# Patient Record
Sex: Female | Born: 1975 | Race: White | Hispanic: No | Marital: Single | Smoking: Former smoker
Health system: Southern US, Community
[De-identification: ages and names within clinical notes are randomized; demographics above are authoritative.]

## PROBLEM LIST (undated history)

## (undated) DIAGNOSIS — I5020 Unspecified systolic (congestive) heart failure: Secondary | ICD-10-CM

## (undated) DIAGNOSIS — M349 Systemic sclerosis, unspecified: Secondary | ICD-10-CM

## (undated) DIAGNOSIS — E063 Autoimmune thyroiditis: Secondary | ICD-10-CM

## (undated) DIAGNOSIS — D649 Anemia, unspecified: Secondary | ICD-10-CM

## (undated) DIAGNOSIS — I428 Other cardiomyopathies: Secondary | ICD-10-CM

## (undated) DIAGNOSIS — M797 Fibromyalgia: Secondary | ICD-10-CM

## (undated) DIAGNOSIS — M779 Enthesopathy, unspecified: Secondary | ICD-10-CM

## (undated) DIAGNOSIS — I447 Left bundle-branch block, unspecified: Secondary | ICD-10-CM

## (undated) DIAGNOSIS — Z87442 Personal history of urinary calculi: Secondary | ICD-10-CM

## (undated) DIAGNOSIS — J40 Bronchitis, not specified as acute or chronic: Secondary | ICD-10-CM

## (undated) DIAGNOSIS — D8989 Other specified disorders involving the immune mechanism, not elsewhere classified: Secondary | ICD-10-CM

## (undated) DIAGNOSIS — I10 Essential (primary) hypertension: Secondary | ICD-10-CM

## (undated) DIAGNOSIS — K219 Gastro-esophageal reflux disease without esophagitis: Secondary | ICD-10-CM

## (undated) DIAGNOSIS — N809 Endometriosis, unspecified: Secondary | ICD-10-CM

## (undated) DIAGNOSIS — E069 Thyroiditis, unspecified: Secondary | ICD-10-CM

## (undated) DIAGNOSIS — I454 Nonspecific intraventricular block: Secondary | ICD-10-CM

## (undated) DIAGNOSIS — N189 Chronic kidney disease, unspecified: Secondary | ICD-10-CM

## (undated) DIAGNOSIS — I739 Peripheral vascular disease, unspecified: Secondary | ICD-10-CM

## (undated) DIAGNOSIS — G4730 Sleep apnea, unspecified: Secondary | ICD-10-CM

## (undated) DIAGNOSIS — M3500 Sicca syndrome, unspecified: Secondary | ICD-10-CM

## (undated) DIAGNOSIS — J189 Pneumonia, unspecified organism: Secondary | ICD-10-CM

## (undated) DIAGNOSIS — I272 Pulmonary hypertension, unspecified: Secondary | ICD-10-CM

## (undated) DIAGNOSIS — R51 Headache: Secondary | ICD-10-CM

## (undated) DIAGNOSIS — R519 Headache, unspecified: Secondary | ICD-10-CM

## (undated) HISTORY — PX: COLONOSCOPY: SHX174

## (undated) HISTORY — PX: UPPER GI ENDOSCOPY: SHX6162

## (undated) HISTORY — DX: Other specified disorders involving the immune mechanism, not elsewhere classified: D89.89

## (undated) HISTORY — PX: CHOLECYSTECTOMY: SHX55

## (undated) HISTORY — DX: Thyroiditis, unspecified: E06.9

## (undated) HISTORY — PX: CYSTOSCOPY: SUR368

## (undated) HISTORY — PX: WISDOM TOOTH EXTRACTION: SHX21

---
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
## 2013-04-10 ENCOUNTER — Other Ambulatory Visit: Payer: Self-pay | Admitting: Family Medicine

## 2013-04-10 DIAGNOSIS — E041 Nontoxic single thyroid nodule: Secondary | ICD-10-CM

## 2013-04-15 ENCOUNTER — Ambulatory Visit
Admission: RE | Admit: 2013-04-15 | Discharge: 2013-04-15 | Disposition: A | Discharge status: Home / Self Care | Payer: Medicaid Other | Source: Ambulatory Visit | Attending: Family Medicine | Admitting: Family Medicine

## 2013-04-15 DIAGNOSIS — E041 Nontoxic single thyroid nodule: Secondary | ICD-10-CM

## 2013-11-11 ENCOUNTER — Encounter: Payer: Self-pay | Admitting: Internal Medicine

## 2013-11-11 ENCOUNTER — Ambulatory Visit (INDEPENDENT_AMBULATORY_CARE_PROVIDER_SITE_OTHER): Payer: Medicaid Other | Admitting: Internal Medicine

## 2013-11-11 ENCOUNTER — Encounter (INDEPENDENT_AMBULATORY_CARE_PROVIDER_SITE_OTHER): Payer: Self-pay

## 2013-11-11 VITALS — BP 128/72 | HR 93 | Temp 98.4°F | Resp 12 | Ht 65.0 in | Wt 276.0 lb

## 2013-11-11 DIAGNOSIS — E063 Autoimmune thyroiditis: Secondary | ICD-10-CM

## 2013-11-11 DIAGNOSIS — R5381 Other malaise: Secondary | ICD-10-CM

## 2013-11-11 DIAGNOSIS — R5383 Other fatigue: Secondary | ICD-10-CM

## 2013-11-11 NOTE — Progress Notes (Signed)
Patient ID: Sarah English, female   DOB: 05-17-1976, 38 y.o.   MRN: [id number]   HPI  Sarah English is a 38 y.o.-year-old female, referred by her PCP, Dr. Clarene Duke, for evaluation for Hashimoto's thyroiditis and a thyroid cyst. She relocated in [place] in 01/2013, from [place], [place].  Pt. has been dx with thyroiditis based on the aspect of the thyroid at the time of a recent thyroid U/S: heterogenous aspect, c/w thyroiditis.   Pt does feel an occasional "lump" in neck, also hoarseness, occasional dysphagia/no odynophagia, no SOB with lying down.  I reviewed pt's latest thyroid tests - per records from PCP: TSH 2.5   Pt describes in the last 1.5 years, increased in last 5-8 mo: - fatigue - HAs - losing hair, bald spots, female-pattern balding - inability to concentrate - joint pain - like broken glass in joints! - mm aches - weight gain: 180 >> 276 now (over 1 year) - cold intolerance - no constipation - + dry skin - no depression/some anxiety - lately - swelling in hands and feet She has a h/o anemia in the past.  She is a single mom. She is a relatively clean eating: organic, minimally processed, no artificial sweeteners, no sodas. She was running 3 miles every day 5/7 days - no energy now.  She has no FH of thyroid disorders. No FH of thyroid cancer.  No h/o radiation tx to head or neck. She started Tri-iodine and Tyrosine 1.5 mo ago >> helped a little. She does not use iodinated salt at home.  ROS: Constitutional: see HPI Eyes: no blurry vision, no xerophthalmia ENT: no sore throat, + nodules palpated in throat, + dysphagia/no odynophagia, + hoarseness, + tinnitus Cardiovascular: no CP/SOB/palpitations/+leg swelling Respiratory: no cough/SOB Gastrointestinal: no N/V/D/C, + heartburn Musculoskeletal: + all: muscle/joint aches Skin: + rash, + itching, + hair loss Neurological: no tremors/numbness/tingling/dizziness, + HA Psychiatric: no depression/+ anxiety  Past Medical  History  Diagnosis Date  . Autoimmune disorder   . Thyroiditis    Past Surgical History  Procedure Laterality Date  . Mandible fracture surgery  1994    upper mandibular  . Cholecystectomy    . Cesarean section     History   Social History  . Marital Status: single    Spouse Name: N/A    Number of Children: 1   Occupational History  . Unemployed, looking for work in the area   Social History Main Topics  . Smoking status: Former Games developer  . Smokeless tobacco: No  . Alcohol Use: No  . Drug Use: No   No current outpatient prescriptions on file prior to visit.   No current facility-administered medications on file prior to visit.   Allergies  Allergen Reactions  . Sulfa Antibiotics Nausea Only   Family History  Problem Relation Age of Onset  . Diabetes Mother     Type II  . Heart disease Mother     stent surgery  . Hypertension Mother   . Hyperlipidemia Mother   . Diabetes Father     Type II  . Hypertension Father   . Hyperlipidemia Father    PE: BP 128/72  Pulse 93  Temp(Src) 98.4 F (36.9 C) (Oral)  Resp 12  Ht 5\' 5"  (1.651 m)  Wt 276 lb (125.193 kg)  BMI 45.93 kg/m2  SpO2 98%  LMP 09/22/2013 Wt Readings from Last 3 Encounters:  11/11/13 276 lb (125.193 kg)   Constitutional: obese, in NAD Eyes: PERRLA, EOMI, no exophthalmos ENT:  moist mucous membranes, no thyromegaly, no cervical lymphadenopathy Cardiovascular: RRR, No MRG Respiratory: CTA B Gastrointestinal: abdomen soft, NT, ND, BS+ Musculoskeletal: no deformities, strength intact in all 4 Skin: moist, warm, no rashes Neurological: no tremor with outstretched hands, DTR normal in all 4  ASSESSMENT: 1. Hashimoto's thyroiditis - euthyroid  2. Fatigue  PLAN:  1. Patient with nonspecific general sxs, possibly related to Hashimoto's thyroiditis. Explained that high TPO Ab's can cause these sxs and also thyroid swelling when titer high. There is no available tx for this, but to try to built up  her immune system. - She does not appear to have a goiter, thyroid nodules, or neck compression symptoms - we'll check thyroid tests today: TSH, free T4, free T3, antiTPO Ab's - for now, continue iodine and tyrosine supplements - will need to stop in the future. Advised not to avoid iodine from now on >> buy iodinated salt, etc. - If these are abnormal, she will need to return in 6 weeks for repeat labs - If these are normal, I will see her back in 3 months. If labs normal >> may need a rheumatologic eval.  2. Fatigue - will check TFTs, vit D and vit B12 (she is taking B12 supplements po)   Office Visit on 11/11/2013  Component Date Value Ref Range Status  . Vit D, 25-Hydroxy 11/11/2013 31  30 - 89 ng/mL Final   Comment: This assay accurately quantifies Vitamin D, which is the sum of the                          25-Hydroxy forms of Vitamin D2 and D3.  Studies have shown that the                          optimum concentration of 25-Hydroxy Vitamin D is 30 ng/mL or higher.                           Concentrations of Vitamin D between 20 and 29 ng/mL are considered to                          be insufficient and concentrations less than 20 ng/mL are considered                          to be deficient for Vitamin D.  . T3, Free 11/11/2013 3.1  2.3 - 4.2 pg/mL Final  . Free T4 11/11/2013 0.84  0.80 - 1.80 ng/dL Final  . TSH 16/10/960403/24/2015 4.230  0.350 - 4.500 uIU/mL Final  . Thyroid Peroxidase Antibody 11/11/2013 74.0* <35.0 IU/mL Final   Comment:                            The thyroid microsomal antigen has been shown to be Thyroid                          Peroxidase (TPO).  This assay detects anti-TPO antibodies.   Lab Results  Component Value Date   VITAMINB12 474 11/24/2013   D/w pt Re: lab results >> advised to start Vit D supplement 2000 IU daily. Also advised to stop iodine and tyrosine supplements and we will recheck TFTs off these supplements when she  comes back in 01/2014.

## 2013-11-11 NOTE — Patient Instructions (Signed)
Continue the Tyrosine and iodine supplement for now. Please stop at the lab. Please join MyChart >> I will send you the labs through there. Please come back for a follow-up appointment in 3 months. Hang in there!

## 2013-11-12 LAB — THYROID PEROXIDASE ANTIBODY: Thyroperoxidase Ab SerPl-aCnc: 74 IU/mL — ABNORMAL HIGH (ref <35.0)

## 2013-11-12 LAB — VITAMIN D 25 HYDROXY (VIT D DEFICIENCY, FRACTURES): VIT D 25 HYDROXY: 31 ng/mL (ref 30–89)

## 2013-11-12 LAB — T3, FREE: T3, Free: 3.1 pg/mL (ref 2.3–4.2)

## 2013-11-12 LAB — TSH: TSH: 4.23 u[IU]/mL (ref 0.350–4.500)

## 2013-11-12 LAB — T4, FREE: Free T4: 0.84 ng/dL (ref 0.80–1.80)

## 2013-11-20 ENCOUNTER — Other Ambulatory Visit: Payer: Self-pay | Admitting: *Deleted

## 2013-11-20 DIAGNOSIS — R5381 Other malaise: Secondary | ICD-10-CM

## 2013-11-20 DIAGNOSIS — R5383 Other fatigue: Principal | ICD-10-CM

## 2013-11-24 ENCOUNTER — Other Ambulatory Visit (INDEPENDENT_AMBULATORY_CARE_PROVIDER_SITE_OTHER): Payer: Medicaid Other

## 2013-11-24 ENCOUNTER — Encounter: Payer: Self-pay | Admitting: Internal Medicine

## 2013-11-24 ENCOUNTER — Telehealth: Payer: Self-pay | Admitting: Internal Medicine

## 2013-11-24 DIAGNOSIS — R5383 Other fatigue: Secondary | ICD-10-CM

## 2013-11-24 DIAGNOSIS — R5381 Other malaise: Secondary | ICD-10-CM | POA: Insufficient documentation

## 2013-11-24 DIAGNOSIS — E063 Autoimmune thyroiditis: Secondary | ICD-10-CM | POA: Insufficient documentation

## 2013-11-24 LAB — VITAMIN B12: Vitamin B-12: 474 pg/mL (ref 211–911)

## 2013-11-24 NOTE — Telephone Encounter (Signed)
Called and explained labs to pt. No f/u thyroid U/S needed.

## 2013-11-24 NOTE — Telephone Encounter (Signed)
See below and please advise, Thanks!  

## 2013-11-24 NOTE — Telephone Encounter (Signed)
Pt states that she had an U/S done in Aug on her thyroid and she believes she is due for another follow up U/S Dr. Clarene Duke her pcp had ordered her first U/S and is Dr. Elvera Lennox to do the follow up  Also she would like to know if she is to call for her lab results   Please advise patient   Call back: [phone]  Thank You :)

## 2014-02-10 ENCOUNTER — Ambulatory Visit (INDEPENDENT_AMBULATORY_CARE_PROVIDER_SITE_OTHER): Payer: Medicaid Other | Admitting: Internal Medicine

## 2014-02-10 ENCOUNTER — Encounter: Payer: Self-pay | Admitting: Internal Medicine

## 2014-02-10 VITALS — BP 120/72 | HR 77 | Temp 98.1°F | Ht 65.0 in | Wt 277.0 lb

## 2014-02-10 DIAGNOSIS — R5383 Other fatigue: Secondary | ICD-10-CM

## 2014-02-10 DIAGNOSIS — E063 Autoimmune thyroiditis: Secondary | ICD-10-CM

## 2014-02-10 DIAGNOSIS — R5381 Other malaise: Secondary | ICD-10-CM

## 2014-02-10 DIAGNOSIS — E559 Vitamin D deficiency, unspecified: Secondary | ICD-10-CM | POA: Insufficient documentation

## 2014-02-10 LAB — TSH: TSH: 0.3 u[IU]/mL — ABNORMAL LOW (ref 0.35–4.50)

## 2014-02-10 LAB — VITAMIN D 25 HYDROXY (VIT D DEFICIENCY, FRACTURES): VITD: 31.42 ng/mL

## 2014-02-10 LAB — T4, FREE: Free T4: 0.71 ng/dL (ref 0.60–1.60)

## 2014-02-10 LAB — T3, FREE: T3 FREE: 2.8 pg/mL (ref 2.3–4.2)

## 2014-02-10 NOTE — Patient Instructions (Signed)
Please stop at the lab.  Plant-based diet materials:  - Lectures (you tube):  Lequita Asal: "Breaking the Food Seduction"  Doug Lisle: "How to Lose Weight, without Losing Your Mind"  Lucile Crater: "What is Insulin Resistance" [employer] - Documentaries:  Supersize Me  [employer]  Forks over [employer], Sick and Nearly Dead  [employer] the [employer] - Books:  Lequita Asal: "Program for Reversing Diabetes"  Ferol Luz: "The [place] Study"  Konrad Penta: "Supermarket Vegan" (cookbook) - Facebook pages:   Reece Agar versus Knives  Vegucated  [employer] Magazine  Food Matters - Healthy nutrition info websites:  LateTelevision.com.ee  Please come back for a follow-up appointment in 6 months

## 2014-02-10 NOTE — Progress Notes (Addendum)
Patient ID: Sarah English, female   DOB: 29-Oct-1975, 38 y.o.   MRN: [id number]   HPI  Sarah English is a 38 y.o.-year-old female, returning for f/u for Hashimoto's thyroiditis, fatigue, and obesity. She relocated in [place] in 01/2013, from [place], [place]. Last visit 3 mo ago.  She developed: - eczema - dry eyes Since last visit >> started Restasis eye drops, anti-eczema shampoo.  Pt. has been dx with thyroiditis based on the aspect of the thyroid at the time of a recent thyroid U/S: heterogenous aspect, c/w thyroiditis.   Pt does feel an occasional "lump" in neck, also hoarseness, occasional dysphagia/no odynophagia, no SOB with lying down.  I reviewed pt's latest thyroid tests - per records from PCP: TSH 2.5   At last visit, she was taking Tri-iodine and Tyrosine (started 1.5 mo prior) but she was not using iodinated salt at home. I advised her to stop the supplements and to start using iodated salt. Sxs a little better after this:  Pt describes in the last 1.5 years: - + fatigue - the same - + does not sleep well - + joint and bone pain  - has an appt with rheumatology 03/16/2014  - + HAs - mostly back of head - + losing hair, bald spots, female-pattern balding - + joint pain - like broken glass in joints! - + mm aches - weight gain: 180 >> 276 >> 291 >> 277 (exercise, diet) - no constipation - + dry skin - no depression/anxiety - + swelling in hands and feet  She is a single mom. She is a relatively clean eating: organic, minimally processed, no artificial sweeteners, no sodas. She restarted exercise.  ROS: Constitutional: see HPI Eyes: no blurry vision, no xerophthalmia ENT: + sore throat, + nodules palpated in throat, + dysphagia/no odynophagia, + hoarseness, + tinnitus Cardiovascular: no CP/SOB/palpitations/+ leg and hand swelling Respiratory: + both: cough/SOB Gastrointestinal: + N/+ V/D/C, no heartburn Musculoskeletal: + all: muscle/joint aches Skin: + rash (eczema), +  itching, + hair loss Neurological: no tremors/numbness/tingling/dizziness, + HA  I reviewed pt's medications, allergies, PMH, social hx, family hx and no changes required, except as mentioned above.  PE: BP 120/72  Pulse 77  Temp(Src) 98.1 F (36.7 C) (Oral)  Ht 5\' 5"  (1.651 m)  Wt 277 lb (125.646 kg)  BMI 46.10 kg/m2  SpO2 98% Wt Readings from Last 3 Encounters:  02/10/14 277 lb (125.646 kg)  11/11/13 276 lb (125.193 kg)   Constitutional: obese, in NAD Eyes: PERRLA, EOMI, no exophthalmos ENT: moist mucous membranes, no thyromegaly, no cervical lymphadenopathy Cardiovascular: RRR, No MRG Respiratory: CTA B Gastrointestinal: abdomen soft, NT, ND, BS+ Musculoskeletal: no deformities, strength intact in all 4 Skin: moist, warm, + rash (eczema upper arms); female patter baldness Neurological: no tremor with outstretched hands, DTR normal in all 4  ASSESSMENT: 1. Hashimoto's thyroiditis - euthyroid  2. Fatigue  3. Obesity  4. Low vit D  PLAN:  1. Patient with nonspecific general sxs, possibly related to Hashimoto's thyroiditis. Explained that high TPO Ab's can cause these sxs and also thyroid swelling when titer high. There is no available tx for this, but to try to built up her immune system. - She does not appear to have a goiter, thyroid nodules, or neck compression symptoms - we'll check thyroid tests today: TSH, free T4, free T3 now that she is off the iodine supplement -  If these are abnormal, she will need to return in 6 weeks for repeat labs -  If these are normal, I will see her back in 6 months.   2. Fatigue and 4. Low Vit D  - at last visit, we checked vit D and vit B12 (she was taking B12 supplements po). Vit D returned borderline low >> I advised to start Vit D supplement 2000 IU daily  - will recheck the vit D level today  3. Obesity - we discussed about a healthy diet and I suggested to try a plant based diet >> given reference materials - congratulated her  on the weight loss of 14 lbs (see HPI). She is doing a great job writing down what she eats and also uses Myfitnesspal app. She started to exercise again.  - time spent with the patient: 40 min, of which >50% was spent in obtaining information about her symptoms, reviewing her previous labs, evaluations, and treatments, counseling her about her medical conditions (please see the discussed topics above). She had a number of questions which I addressed.  Component     Latest Ref Rng 02/10/2014  T3, Free     2.3 - 4.2 pg/mL 2.8  Free T4     0.60 - 1.60 ng/dL 4.090.71  TSH     8.110.35 - 9.144.50 uIU/mL 0.30 (L)  VITD      31.42   Increase vit D to 4000 units daily. Will recheck in 2 mo. TSH now slight low >> will recheck in 2 months.

## 2014-02-11 NOTE — Addendum Note (Signed)
Addended by: Carlus Pavlov on: 02/11/2014 01:42 PM   Modules accepted: Orders

## 2014-03-24 ENCOUNTER — Telehealth: Payer: Self-pay | Admitting: Internal Medicine

## 2014-03-24 NOTE — Telephone Encounter (Signed)
Pt wants to discuss her appt outcome with the rheumatology MD also she thinks she may need to come in earlier than 12/15 does she need labs 1st

## 2014-03-24 NOTE — Telephone Encounter (Signed)
Labs are in >> she needs to come back before the end of the month for labs.

## 2014-03-24 NOTE — Telephone Encounter (Signed)
Please read note below and advise.  

## 2014-03-25 NOTE — Telephone Encounter (Signed)
Called pt and and scheduled for her to have labs done on Thur. Aug 6th at 11:00 am.

## 2014-03-26 ENCOUNTER — Other Ambulatory Visit: Payer: Self-pay | Admitting: Internal Medicine

## 2014-03-26 ENCOUNTER — Other Ambulatory Visit (INDEPENDENT_AMBULATORY_CARE_PROVIDER_SITE_OTHER): Payer: Medicaid Other

## 2014-03-26 DIAGNOSIS — E063 Autoimmune thyroiditis: Secondary | ICD-10-CM

## 2014-03-26 DIAGNOSIS — E559 Vitamin D deficiency, unspecified: Secondary | ICD-10-CM

## 2014-03-26 LAB — VITAMIN D 25 HYDROXY (VIT D DEFICIENCY, FRACTURES): VITD: 36.55 ng/mL (ref 30.00–100.00)

## 2014-03-26 LAB — T3, FREE: T3, Free: 2.3 pg/mL (ref 2.3–4.2)

## 2014-03-26 LAB — T4, FREE: Free T4: 0.46 ng/dL — ABNORMAL LOW (ref 0.60–1.60)

## 2014-03-26 LAB — TSH: TSH: 0.39 u[IU]/mL (ref 0.35–4.50)

## 2014-07-30 ENCOUNTER — Ambulatory Visit: Payer: Medicaid Other | Admitting: Internal Medicine

## 2014-07-30 ENCOUNTER — Ambulatory Visit (INDEPENDENT_AMBULATORY_CARE_PROVIDER_SITE_OTHER): Payer: Medicaid Other | Admitting: Internal Medicine

## 2014-07-30 ENCOUNTER — Other Ambulatory Visit: Payer: Self-pay | Admitting: Internal Medicine

## 2014-07-30 ENCOUNTER — Encounter: Payer: Self-pay | Admitting: Internal Medicine

## 2014-07-30 VITALS — BP 126/84 | HR 81 | Temp 97.8°F | Resp 16 | Ht 65.0 in | Wt 279.0 lb

## 2014-07-30 DIAGNOSIS — E063 Autoimmune thyroiditis: Secondary | ICD-10-CM

## 2014-07-30 DIAGNOSIS — E559 Vitamin D deficiency, unspecified: Secondary | ICD-10-CM

## 2014-07-30 NOTE — Progress Notes (Signed)
Patient ID: Sarah English, female   DOB: [birth year], 38 y.o.   MRN: [id number]   HPI  Sarah English is a 38 y.o.-year-old female, returning for f/u for Hashimoto's thyroiditis, fatigue, vitamin D def.  Last visit 4 mo ago.  She was dx with scleroderma and Sjogren sd. by rheumatologist.  Pt. has been dx with thyroiditis based on the aspect of the thyroid at the time of a recent thyroid U/S: heterogenous aspect, c/w thyroiditis.   Pt does feel an "lump" on the R jaw, also hoarseness, occasional dysphagia/no odynophagia, no SOB with lying down.  I reviewed pt's latest thyroid tests: Lab Results  Component Value Date   TSH 0.39 03/26/2014   TSH 0.30* 02/10/2014   TSH 4.230 11/11/2013   FREET4 0.46* 03/26/2014   FREET4 0.71 02/10/2014   FREET4 0.84 11/11/2013   At our first visit, she was taking Tri-iodine and Tyrosine (started 1.5 mo prior) but she was not using iodinated salt at home. I advised her to stop the supplements and to start using iodated salt. Sxs a little better after this and het thyroid tests normalized.  Pt again describes: - + fatigue  - + does not sleep well - + mm aches - + joint and bone pain  - + HAs - mostly back of head - + hair loss - weight gain: 180 >> 276 >> 291 >> 277 (exercise, diet)>> 279 - + constipation - no depression/anxiety - + swelling in hands and feet  She is a single mom. She is a relatively clean eating: organic, minimally processed, no artificial sweeteners, no sodas.  She also has vit D def. >> we initially started 2000 IU daily. Last level:  Component     Latest Ref Rng 02/10/2014  VITD      31.42   We Increased vit D to 4000 units daily >>   Component     Latest Ref Rng 03/26/2014  VITD     30.00 - 100.00 ng/mL 36.55   She was then advised to start Ergocalciferol 50,000 IU once a week x 1 mo.   ROS: Constitutional: see HPI Eyes: no blurry vision, no xerophthalmia ENT: no sore throat, + nodules palpated in throat, +  dysphagia/no odynophagia, + hoarseness, + tinnitus Cardiovascular: no CP/SOB/palpitations/+ leg and hand swelling Respiratory: + cough/no SOB Gastrointestinal: no N/V/D/+C, no heartburn Musculoskeletal: + all: muscle/+ joint aches Skin: + rash (eczema), + itching, + hair loss Neurological: no tremors/numbness/tingling/dizziness, + HA + Irreg menses  I reviewed pt's medications, allergies, PMH, social hx, family hx and no changes required, except as mentioned above.  PE: BP 126/84 mmHg  Pulse 81  Temp(Src) 97.8 F (36.6 C)  Resp 16  Ht 5\' 5"  (1.651 m)  Wt 279 lb (126.554 kg)  BMI 46.43 kg/m2  SpO2 98% Wt Readings from Last 3 Encounters:  07/30/14 279 lb (126.554 kg)  02/10/14 277 lb (125.646 kg)  11/11/13 276 lb (125.193 kg)   Constitutional: obese, in NAD Eyes: PERRLA, EOMI, no exophthalmos ENT: moist mucous membranes, no thyromegaly, no cervical lymphadenopathy Cardiovascular: RRR, No MRG Respiratory: CTA B Gastrointestinal: abdomen soft, NT, ND, BS+ Musculoskeletal: no deformities, strength intact in all 4 Skin: moist, warm; female patter baldness Neurological: no tremor with outstretched hands, DTR normal in all 4  ASSESSMENT: 1. Hashimoto's thyroiditis - euthyroid  2. Fatigue  3. Low vit D  PLAN:  1. Patient with Hashimoto's thyroiditis. Explained that high TPO Ab's can cause these sxs and also thyroid swelling  when titer high. There is no available tx for this, but to try to built up her immune system. - She does not appear to have a goiter, thyroid nodules, or neck compression symptoms. She has a jaw nodule (bone consistency on palpation), being followed by PCP - we'll check thyroid tests today: TSH, free T4, free T3  -  If these are abnormal, she will need to return in 6 weeks for repeat labs - If these are normal, I will see her back in 6 months.   3. Fatigue  - she apparently has 3 autoimmune ds' s:  Hashimoto's thyroiditis Sjogren sd. Scleroderma She is  on vit D and B12 supplements - per rheum - There is not much we can do for euthyroid Hashimoto's thyroiditis, but need to continue to follow the TFTs >> tx with LT4 if became abnormal  4. Low Vit D  - at last visit, we checked vit D and vit B12 (she was taking B12 supplements po). Vit D returned borderline low >> I advised to start Vit D supplement 2000 IU daily, then increased to 4000 IU daily.  - the last vit D level checked was normal, but at the last check i rheumatologist office, it was low >> started on high dose Ergocalciferol 50,000 IU weekly - continue this regimen >> per rheum  Orders Only on 07/30/2014  Component Date Value Ref Range Status  . TSH 07/30/2014 5.423* 0.350 - 4.500 uIU/mL Final  . Free T4 07/30/2014 0.68* 0.80 - 1.80 ng/dL Final  . T3, Free 62/95/284112/05/2014 3.2  2.3 - 4.2 pg/mL Final   Overt hypothyroidism now >> will suggest to start LT4 25 mcg daily. Will advise her to take the thyroid hormone every day, with water, >30 minutes before breakfast, separated by >4 hours from acid reflux medications, calcium, iron, multivitamins. Need repeat TFTs in 5-6 weeks.

## 2014-07-30 NOTE — Patient Instructions (Signed)
Please stop at [facility] lab downstairs. Please come back for a follow-up appointment in 6 months.  

## 2014-07-31 LAB — TSH: TSH: 5.423 u[IU]/mL — AB (ref 0.350–4.500)

## 2014-07-31 LAB — T3, FREE: T3 FREE: 3.2 pg/mL (ref 2.3–4.2)

## 2014-07-31 LAB — T4, FREE: FREE T4: 0.68 ng/dL — AB (ref 0.80–1.80)

## 2014-08-01 MED ORDER — LEVOTHYROXINE SODIUM 25 MCG PO TABS: 25.0000 ug | ORAL_TABLET | Freq: Every day | ORAL | Status: DC

## 2014-12-03 ENCOUNTER — Telehealth: Payer: Self-pay | Admitting: Internal Medicine

## 2014-12-03 MED ORDER — LEVOTHYROXINE SODIUM 25 MCG PO TABS: 25.0000 ug | ORAL_TABLET | Freq: Every day | ORAL | Status: DC

## 2014-12-03 NOTE — Telephone Encounter (Signed)
Done

## 2014-12-03 NOTE — Telephone Encounter (Signed)
Patient need refill of levothyroxine  °

## 2015-01-29 ENCOUNTER — Telehealth: Payer: Self-pay | Admitting: Internal Medicine

## 2015-01-29 ENCOUNTER — Ambulatory Visit: Payer: Medicaid Other | Admitting: Internal Medicine

## 2015-01-29 MED ORDER — LEVOTHYROXINE SODIUM 25 MCG PO TABS: 25.0000 ug | ORAL_TABLET | Freq: Every day | ORAL | Status: DC

## 2015-01-29 NOTE — Telephone Encounter (Signed)
levothyroxine needs refill please

## 2015-01-29 NOTE — Telephone Encounter (Signed)
Refill sent to pt's pharmacy. 

## 2015-03-26 ENCOUNTER — Ambulatory Visit: Payer: Medicaid Other | Admitting: Internal Medicine

## 2015-03-30 ENCOUNTER — Encounter: Payer: Self-pay | Admitting: Internal Medicine

## 2015-03-30 ENCOUNTER — Ambulatory Visit (INDEPENDENT_AMBULATORY_CARE_PROVIDER_SITE_OTHER): Payer: Medicaid Other | Admitting: Internal Medicine

## 2015-03-30 VITALS — BP 124/88 | HR 74 | Temp 98.9°F | Resp 12 | Wt 283.4 lb

## 2015-03-30 DIAGNOSIS — E063 Autoimmune thyroiditis: Secondary | ICD-10-CM | POA: Diagnosis not present

## 2015-03-30 LAB — T4, FREE: Free T4: 1.02 ng/dL (ref 0.60–1.60)

## 2015-03-30 LAB — TSH: TSH: 0.21 u[IU]/mL — AB (ref 0.35–4.50)

## 2015-03-30 NOTE — Patient Instructions (Signed)
Please stop at the lab.  Take the thyroid hormone every day, with water, >30 minutes before breakfast, separated by >4 hours from acid reflux medications, calcium, iron, multivitamins.  Please come back for a follow-up appointment in 1 year.

## 2015-03-30 NOTE — Progress Notes (Signed)
Patient ID: Sarah English, female   DOB: [birth year], 39 y.o.   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  Last visit 8 mo ago.  She also has scleroderma, Sjogren sd., vit D def. - managed by her rheumatologist.   Since last visit >> MRI, then lumbar puncture performed for R leg weakness with falls >> no MS; developed a staph infection, then a viral gastroenteritis  Reviewed hx: Pt. has been dx with thyroiditis based on the aspect of the thyroid at the time of a recent thyroid U/S: heterogenous aspect, c/w thyroiditis.   Pt does feel an "lump" on the R jaw, also hoarseness, occasional dysphagia/no odynophagia, no SOB with lying down.  I reviewed pt's latest thyroid tests: Lab Results  Component Value Date   TSH 5.423* 07/30/2014   TSH 0.39 03/26/2014   TSH 0.30* 02/10/2014   TSH 4.230 11/11/2013   FREET4 0.68* 07/30/2014   FREET4 0.46* 03/26/2014   FREET4 0.71 02/10/2014   FREET4 0.84 11/11/2013   In the past, she was taking Tri-iodine and Tyrosine (started 1.5 mo prior) but she was not using iodinated salt at home. I advised her to stop the supplements and to start using iodated salt.   As the last TSH was high >> we started LT4 25 mcg daily. She did not come back for repeat labs.  She is taking the LT4: - in am - fasting - with water - >60 min before b'fast - not on PPI, Ca, Fe,  - + MVI with lunch - Biotin at lunch, not today or yesterday.  Pt again describes: - + fatigue  - + poor sleep - + mm aches - + joint and bone pain  - + HAs  - started Neurontin and Cymbalta  -also for back pain  - + hair loss - + weight gain - + constipation, + nausea - no depression/anxiety - + swelling in hands and feet  She is a single mom. She is a relatively clean eating: organic, minimally processed, no artificial sweeteners, no sodas.  ROS: Constitutional: see HPI Eyes: no blurry  vision, no xerophthalmia ENT: no sore throat, + nodules palpated in upper throat, + tinnitus Cardiovascular: no CP/SOB/palpitations/+ leg and hand swelling Respiratory: + cough/no SOB Gastrointestinal: + N/no V/D/+C, no heartburn  Musculoskeletal: + all: muscle/+ joint aches Skin: no rash, + itching, + hair loss Neurological: no tremors/numbness/tingling/dizziness, + HA  I reviewed pt's medications, allergies, PMH, social hx, family hx, and changes were documented in the history of present illness. Otherwise, unchanged from my initial visit note.  PE: BP 124/88 mmHg  Pulse 74  Temp(Src) 98.9 F (37.2 C) (Oral)  Resp 12  Wt 283 lb 6.4 oz (128.549 kg)  SpO2 97% Body mass index is 47.16 kg/(m^2). Wt Readings from Last 3 Encounters:  03/30/15 283 lb 6.4 oz (128.549 kg)  07/30/14 279 lb (126.554 kg)  02/10/14 277 lb (125.646 kg)   Constitutional: obese, in NAD Eyes: PERRLA, EOMI, no exophthalmos ENT: moist mucous membranes, no thyromegaly, no cervical lymphadenopathy Cardiovascular: RRR, No MRG Respiratory: CTA B Gastrointestinal: abdomen soft, NT, ND, BS+ Musculoskeletal: no deformities, strength intact in all 4 Skin: moist, warm; female patter baldness Neurological: no tremor with outstretched hands, DTR normal in all 4  ASSESSMENT: 1. 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 She does not appear to have a goiter,  thyroid nodules, or neck compression symptoms. - At last visit, her TSH was a little high >> we started LT4 25 mcg daily - we'll check thyroid tests today: TSH, free T4 as she did not return for labs 6 weeks after starting LT4  - discuss how to take the thyroid hormone every day, with water, >30 minutes before breakfast, separated by >4 hours from acid reflux medications, calcium, iron, multivitamins. She is taking it correctly. - If labs today are abnormal, she will need to return in 6 weeks for  repeat labs - If these are normal, I will see her back in 1 year.  Needs refills  For LT4.  Component     Latest Ref Rng 03/30/2015  Free T4     0.60 - 1.60 ng/dL 6.96  TSH     2.95 - 2.84 uIU/mL 0.21 (L)   TSH low >> will need to recheck in 2 months. If still low >> need to stop LT4.

## 2015-04-12 ENCOUNTER — Emergency Department (HOSPITAL_COMMUNITY)
Admission: EM | Admit: 2015-04-12 | Discharge: 2015-04-12 | Disposition: A | Discharge status: Home / Self Care | Payer: Medicaid Other | Attending: Emergency Medicine | Admitting: Emergency Medicine

## 2015-04-12 ENCOUNTER — Encounter (HOSPITAL_COMMUNITY): Payer: Self-pay | Admitting: Vascular Surgery

## 2015-04-12 ENCOUNTER — Emergency Department (HOSPITAL_COMMUNITY): Payer: Medicaid Other

## 2015-04-12 DIAGNOSIS — R102 Pelvic and perineal pain: Secondary | ICD-10-CM | POA: Insufficient documentation

## 2015-04-12 DIAGNOSIS — Z79899 Other long term (current) drug therapy: Secondary | ICD-10-CM | POA: Insufficient documentation

## 2015-04-12 DIAGNOSIS — Z3202 Encounter for pregnancy test, result negative: Secondary | ICD-10-CM | POA: Diagnosis not present

## 2015-04-12 DIAGNOSIS — R1031 Right lower quadrant pain: Secondary | ICD-10-CM | POA: Diagnosis not present

## 2015-04-12 DIAGNOSIS — Z87891 Personal history of nicotine dependence: Secondary | ICD-10-CM | POA: Insufficient documentation

## 2015-04-12 DIAGNOSIS — R300 Dysuria: Secondary | ICD-10-CM | POA: Diagnosis present

## 2015-04-12 DIAGNOSIS — E069 Thyroiditis, unspecified: Secondary | ICD-10-CM | POA: Diagnosis not present

## 2015-04-12 DIAGNOSIS — M797 Fibromyalgia: Secondary | ICD-10-CM | POA: Diagnosis not present

## 2015-04-12 HISTORY — DX: Systemic sclerosis, unspecified: M34.9

## 2015-04-12 HISTORY — DX: Autoimmune thyroiditis: E06.3

## 2015-04-12 HISTORY — DX: Fibromyalgia: M79.7

## 2015-04-12 LAB — URINALYSIS, ROUTINE W REFLEX MICROSCOPIC
Bilirubin Urine: NEGATIVE
Glucose, UA: NEGATIVE mg/dL
KETONES UR: NEGATIVE mg/dL
Leukocytes, UA: NEGATIVE
Nitrite: NEGATIVE
PROTEIN: NEGATIVE mg/dL
Specific Gravity, Urine: 1.016 (ref 1.005–1.030)
Urobilinogen, UA: 0.2 mg/dL (ref 0.0–1.0)
pH: 5.5 (ref 5.0–8.0)

## 2015-04-12 LAB — URINE MICROSCOPIC-ADD ON

## 2015-04-12 LAB — COMPREHENSIVE METABOLIC PANEL
ALBUMIN: 4 g/dL (ref 3.5–5.0)
ALK PHOS: 73 U/L (ref 38–126)
ALT: 132 U/L — AB (ref 14–54)
AST: 78 U/L — ABNORMAL HIGH (ref 15–41)
Anion gap: 9 (ref 5–15)
BUN: 10 mg/dL (ref 6–20)
CALCIUM: 9.2 mg/dL (ref 8.9–10.3)
CO2: 27 mmol/L (ref 22–32)
CREATININE: 0.95 mg/dL (ref 0.44–1.00)
Chloride: 101 mmol/L (ref 101–111)
GFR calc non Af Amer: >60 mL/min (ref >60)
GLUCOSE: 90 mg/dL (ref 65–99)
Potassium: 4.1 mmol/L (ref 3.5–5.1)
SODIUM: 137 mmol/L (ref 135–145)
Total Bilirubin: 0.9 mg/dL (ref 0.3–1.2)
Total Protein: 7.4 g/dL (ref 6.5–8.1)

## 2015-04-12 LAB — CBC
HCT: 40.7 % (ref 36.0–46.0)
Hemoglobin: 13.4 g/dL (ref 12.0–15.0)
MCH: 29.8 pg (ref 26.0–34.0)
MCHC: 32.9 g/dL (ref 30.0–36.0)
MCV: 90.4 fL (ref 78.0–100.0)
PLATELETS: 309 10*3/uL (ref 150–400)
RBC: 4.5 MIL/uL (ref 3.87–5.11)
RDW: 13.5 % (ref 11.5–15.5)
WBC: 5.8 10*3/uL (ref 4.0–10.5)

## 2015-04-12 LAB — POC URINE PREG, ED: Preg Test, Ur: NEGATIVE

## 2015-04-12 LAB — I-STAT BETA HCG BLOOD, ED (MC, WL, AP ONLY)

## 2015-04-12 LAB — WET PREP, GENITAL
Clue Cells Wet Prep HPF POC: NONE SEEN
TRICH WET PREP: NONE SEEN
Yeast Wet Prep HPF POC: NONE SEEN

## 2015-04-12 MED ORDER — MORPHINE SULFATE (PF) 2 MG/ML IV SOLN
2.0000 mg | Freq: Once | INTRAVENOUS | Status: AC
  Administered 2015-04-12: 2 mg via INTRAVENOUS
  Filled 2015-04-12: qty 1

## 2015-04-12 MED ORDER — ONDANSETRON HCL 4 MG/2ML IJ SOLN
4.0000 mg | Freq: Once | INTRAMUSCULAR | Status: AC
  Administered 2015-04-12: 4 mg via INTRAVENOUS
  Filled 2015-04-12: qty 2

## 2015-04-12 MED ORDER — IOHEXOL 300 MG/ML  SOLN
100.0000 mL | Freq: Once | INTRAMUSCULAR | Status: AC | PRN
  Administered 2015-04-12: 100 mL via INTRAVENOUS

## 2015-04-12 MED ORDER — ONDANSETRON 4 MG PO TBDP: 4.0000 mg | ORAL_TABLET | Freq: Once | ORAL | Status: DC | PRN

## 2015-04-12 MED ORDER — PREDNISONE 20 MG PO TABS: ORAL_TABLET | ORAL | Status: DC

## 2015-04-12 MED ORDER — IOHEXOL 300 MG/ML  SOLN
25.0000 mL | Freq: Once | INTRAMUSCULAR | Status: AC | PRN
  Administered 2015-04-12: 25 mL via ORAL

## 2015-04-12 NOTE — Discharge Instructions (Signed)
Abdominal Pain °Many things can cause abdominal pain. Usually, abdominal pain is not caused by a disease and will improve without treatment. It can often be observed and treated at home. Your health care provider will do a physical exam and possibly order blood tests and X-rays to help determine the seriousness of your pain. However, in many cases, more time must pass before a clear cause of the pain can be found. Before that point, your health care provider may not know if you need more testing or further treatment. °HOME CARE INSTRUCTIONS  °Monitor your abdominal pain for any changes. The following actions may help to alleviate any discomfort you are experiencing: °· Only take over-the-counter or prescription medicines as directed by your health care provider. °· Do not take laxatives unless directed to do so by your health care provider. °· Try a clear liquid diet (broth, tea, or water) as directed by your health care provider. Slowly move to a bland diet as tolerated. °SEEK MEDICAL CARE IF: °· You have unexplained abdominal pain. °· You have abdominal pain associated with nausea or diarrhea. °· You have pain when you urinate or have a bowel movement. °· You experience abdominal pain that wakes you in the night. °· You have abdominal pain that is worsened or improved by eating food. °· You have abdominal pain that is worsened with eating fatty foods. °· You have a fever. °SEEK IMMEDIATE MEDICAL CARE IF:  °· Your pain does not go away within 2 hours. °· You keep throwing up (vomiting). °· Your pain is felt only in portions of the abdomen, such as the right side or the left lower portion of the abdomen. °· You pass bloody or black tarry stools. °MAKE SURE YOU: °· Understand these instructions.   °· Will watch your condition.   °· Will get help right away if you are not doing well or get worse.   °Document Released: 05/17/2005 Document Revised: 08/12/2013 Document Reviewed: 04/16/2013 °ExitCare® Patient Information  ©2015 [employer]. This information is not intended to replace advice given to you by your health care provider. Make sure you discuss any questions you have with your health care provider. ° ° ° °Pelvic Pain °Female pelvic pain can be caused by many different things and start from a variety of places. Pelvic pain refers to pain that is located in the lower half of the abdomen and between your hips. The pain may occur over a short period of time (acute) or may be reoccurring (chronic). The cause of pelvic pain may be related to disorders affecting the female reproductive organs (gynecologic), but it may also be related to the bladder, kidney stones, an intestinal complication, or muscle or skeletal problems. Getting help right away for pelvic pain is important, especially if there has been severe, sharp, or a sudden onset of unusual pain. It is also important to get help right away because some types of pelvic pain can be life threatening.  °CAUSES  °Below are only some of the causes of pelvic pain. The causes of pelvic pain can be in one of several categories.  °· Gynecologic. °¨ Pelvic inflammatory disease. °¨ Sexually transmitted infection. °¨ Ovarian cyst or a twisted ovarian ligament (ovarian torsion). °¨ Uterine lining that grows outside the uterus (endometriosis). °¨ Fibroids, cysts, or tumors. °¨ Ovulation. °· Pregnancy. °¨ Pregnancy that occurs outside the uterus (ectopic pregnancy). °¨ Miscarriage. °¨ Labor. °¨ Abruption of the placenta or ruptured uterus. °· Infection. °¨ Uterine infection (endometritis). °¨ Bladder infection. °¨ Diverticulitis. °¨   Miscarriage related to a uterine infection (septic abortion). °· Bladder. °¨ Inflammation of the bladder (cystitis). °¨ Kidney stone(s). °· Gastrointestinal. °¨ Constipation. °¨ Diverticulitis. °· Neurologic. °¨ Trauma. °¨ Feeling pelvic pain because of mental or emotional causes (psychosomatic). °· Cancers of the bowel or pelvis. °EVALUATION  °Your caregiver  will want to take a careful history of your concerns. This includes recent changes in your health, a careful gynecologic history of your periods (menses), and a sexual history. Obtaining your family history and medical history is also important. Your caregiver may suggest a pelvic exam. A pelvic exam will help identify the location and severity of the pain. It also helps in the evaluation of which organ system may be involved. In order to identify the cause of the pelvic pain and be properly treated, your caregiver may order tests. These tests may include:  °· A pregnancy test. °· Pelvic ultrasonography. °· An X-ray exam of the abdomen. °· A urinalysis or evaluation of vaginal discharge. °· Blood tests. °HOME CARE INSTRUCTIONS  °· Only take over-the-counter or prescription medicines for pain, discomfort, or fever as directed by your caregiver.   °· Rest as directed by your caregiver.   °· Eat a balanced diet.   °· Drink enough fluids to make your urine clear or pale yellow, or as directed.   °· Avoid sexual intercourse if it causes pain.   °· Apply warm or cold compresses to the lower abdomen depending on which one helps the pain.   °· Avoid stressful situations.   °· Keep a journal of your pelvic pain. Write down when it started, where the pain is located, and if there are things that seem to be associated with the pain, such as food or your menstrual cycle. °· Follow up with your caregiver as directed.   °SEEK MEDICAL CARE IF: °· Your medicine does not help your pain. °· You have abnormal vaginal discharge. °SEEK IMMEDIATE MEDICAL CARE IF:  °· You have heavy bleeding from the vagina.   °· Your pelvic pain increases.   °· You feel light-headed or faint.   °· You have chills.   °· You have pain with urination or blood in your urine.   °· You have uncontrolled diarrhea or vomiting.   °· You have a fever or persistent symptoms for more than 3 days. °· You have a fever and your symptoms suddenly get worse.   °· You are  being physically or sexually abused.   °MAKE SURE YOU: °· Understand these instructions. °· Will watch your condition. °· Will get help if you are not doing well or get worse. °Document Released: 07/04/2004 Document Revised: 12/22/2013 Document Reviewed: 11/27/2011 °ExitCare® Patient Information ©2015 [employer]. This information is not intended to replace advice given to you by your health care provider. Make sure you discuss any questions you have with your health care provider. ° °

## 2015-04-12 NOTE — ED Notes (Signed)
Patient transported to Ultrasound 

## 2015-04-12 NOTE — ED Provider Notes (Signed)
Pt with RLQ pain and R flank pain. S/p treatment for uti. PCP sent here with a prescription for emergent CT abdomen with contrast (Dr. Clarene Duke). There is a long wait, and so i have ordered labs and CT scan to  [facility]ed[facility] workup.  Filed Vitals:   04/12/15 1643  BP: 150/98  Pulse: 81  Temp: 98.1 F (36.7 C)  Resp: 16     Derwood Kaplan, MD 04/15/15 [phone]

## 2015-04-12 NOTE — ED Notes (Signed)
Pt reports to the ED for eval of RLQ abd pain, right sided flank and back pain, N/V, and dysuria. She was seen recently and was dx and tx with Cipro for a UTI however, her symptoms are not resolving. She has finished her abx. She was seen again and was sent here for CT scan because her abd was extremely tender to palpitation. Pt denies any hematemesis, fevers, or chills. Certain movements make the pain worse. Pt A&Ox4, resp e/u, and skin warm and dry.

## 2015-04-12 NOTE — ED Provider Notes (Signed)
CSN: [id number]     Arrival date & time 04/12/15  1631 History   First MD Initiated Contact with Patient 04/12/15 1824     Chief Complaint  Patient presents with  . Abdominal Pain  . Dysuria     (Consider location/radiation/quality/duration/timing/severity/associated sxs/prior Treatment) HPI Comments: Patient sent to the emergency department for evaluation of abdominal pain. Patient has been experiencing pain on the right side of her abdomen for approximately a week. Symptoms worsened over the last several days. Patient was told initially that she had a urinary tract infection when she saw her doctor in the office. She has completed a course of Cipro and has not improved. Pain is now worsening, saw her doctor again today and was noted to have right lower quadrant tenderness, sent to the ER for further evaluation. Patient reports nausea and vomiting associated with symptoms. She has not had any fever.  Patient is a 39 y.o. female presenting with abdominal pain and dysuria.  Abdominal Pain Associated symptoms: dysuria, nausea and vomiting   Dysuria Associated symptoms: abdominal pain, nausea and vomiting     Past Medical History  Diagnosis Date  . Autoimmune disorder   . Thyroiditis   . Hashimoto thyroiditis, fibrous variant   . Fibromyalgia   . Scleroderma    Past Surgical History  Procedure Laterality Date  . Mandible fracture surgery  1994    upper mandibular  . Cholecystectomy    . Cesarean section     Family History  Problem Relation Age of Onset  . Diabetes Mother     Type II  . Heart disease Mother     stent surgery  . Hypertension Mother   . Hyperlipidemia Mother   . Diabetes Father     Type II  . Hypertension Father   . Hyperlipidemia Father    Social History  Substance Use Topics  . Smoking status: Former Games developer  . Smokeless tobacco: None  . Alcohol Use: None   OB History    No data available     Review of Systems  Gastrointestinal: Positive for  nausea, vomiting and abdominal pain.  Genitourinary: Positive for dysuria.  All other systems reviewed and are negative.     Allergies  Sulfa antibiotics  Home Medications   Prior to Admission medications   Medication Sig Start Date End Date Taking? Authorizing Provider  clobetasol ointment (TEMOVATE) 0.05 % Apply 1 application topically daily as needed (for scleraderma).   Yes Historical Provider, MD  Cyanocobalamin (B-12) 500 MCG SUBL Place 1 each under the tongue.   Yes Historical Provider, MD  cyclobenzaprine (FLEXERIL) 10 MG tablet Take 10 mg by mouth 3 (three) times daily as needed for muscle spasms.   Yes Historical Provider, MD  cycloSPORINE (RESTASIS) 0.05 % ophthalmic emulsion Place 1 drop into both eyes 2 (two) times daily.   Yes Historical Provider, MD  DULoxetine (CYMBALTA) 60 MG capsule Take 60 mg by mouth every evening.    Yes Historical Provider, MD  fluorometholone (EFLONE) 0.1 % ophthalmic suspension Place 1 drop into both eyes 2 (two) times daily.   Yes Historical Provider, MD  gabapentin (NEURONTIN) 300 MG capsule Take 300 mg by mouth 3 (three) times daily.  11/20/14 11/20/15 Yes Historical Provider, MD  Homeopathic Products ([facility]) CREA Apply 1 application topically daily as needed (for pain).   Yes Historical Provider, MD  levothyroxine (LEVOTHROID) 25 MCG tablet Take 1 tablet (25 mcg total) by mouth daily before breakfast. 01/29/15  Yes Silvestre Mesi  Elvera Lennox, MD  naproxen sodium (ANAPROX) 220 MG tablet Take 220 mg by mouth 2 (two) times daily as needed (for pain).    Yes Historical Provider, MD  traMADol (ULTRAM) 50 MG tablet Take 50 mg by mouth 2 (two) times daily as needed for moderate pain.    Yes Historical Provider, MD  Vitamin D, Ergocalciferol, (DRISDOL) 50000 UNITS CAPS capsule Take 50,000 Units by mouth every Sunday.    Yes Historical Provider, MD  ciprofloxacin (CIPRO) 500 MG tablet Take 500 mg by mouth 2 (two) times daily.    Historical Provider, MD   predniSONE (DELTASONE) 20 MG tablet 3 tabs po daily x 3 days, then 2 tabs x 3 days, then 1.5 tabs x 3 days, then 1 tab x 3 days, then 0.5 tabs x 3 days 04/12/15   Gilda Crease, MD   BP 126/62 mmHg  Pulse 80  Temp(Src) 98 F (36.7 C) (Oral)  Resp 18  SpO2 97% Physical Exam  Constitutional: She is oriented to person, place, and time. She appears well-developed and well-nourished. No distress.  HENT:  Head: Normocephalic and atraumatic.  Right Ear: Hearing normal.  Left Ear: Hearing normal.  Nose: Nose normal.  Mouth/Throat: Oropharynx is clear and moist and mucous membranes are normal.  Eyes: Conjunctivae and EOM are normal. Pupils are equal, round, and reactive to light.  Neck: Normal range of motion. Neck supple.  Cardiovascular: Regular rhythm, S1 normal and S2 normal.  Exam reveals no gallop and no friction rub.   No murmur heard. Pulmonary/Chest: Effort normal and breath sounds normal. No respiratory distress. She exhibits no tenderness.  Abdominal: Soft. Normal appearance and bowel sounds are normal. There is no hepatosplenomegaly. There is tenderness in the right lower quadrant. There is no rebound, no guarding, no tenderness at McBurney's point and negative Murphy's sign. No hernia.  Musculoskeletal: Normal range of motion.  Neurological: She is alert and oriented to person, place, and time. She has normal strength. No cranial nerve deficit or sensory deficit. Coordination normal. GCS eye subscore is 4. GCS verbal subscore is 5. GCS motor subscore is 6.  Skin: Skin is warm, dry and intact. No rash noted. No cyanosis.  Psychiatric: She has a normal mood and affect. Her speech is normal and behavior is normal. Thought content normal.  Nursing note and vitals reviewed.   ED Course  Procedures (including critical care time) Labs Review Labs Reviewed  WET PREP, GENITAL - Abnormal; Notable for the following:    WBC, Wet Prep HPF POC FEW (*)    All other components within  normal limits  COMPREHENSIVE METABOLIC PANEL - Abnormal; Notable for the following:    AST 78 (*)    ALT 132 (*)    All other components within normal limits  URINALYSIS, ROUTINE W REFLEX MICROSCOPIC (NOT AT [facility]) - Abnormal; Notable for the following:    APPearance CLOUDY (*)    Hgb urine dipstick SMALL (*)    All other components within normal limits  URINE MICROSCOPIC-ADD ON - Abnormal; Notable for the following:    Squamous Epithelial / LPF MANY (*)    All other components within normal limits  URINE CULTURE  CBC  POC URINE PREG, ED  I-STAT BETA HCG BLOOD, ED ([facility], [facility], [facility] ONLY)  GC/CHLAMYDIA PROBE AMP ([place]) NOT AT [facility]    Imaging Review US Transvaginal Non-ob  04/12/2015   CLINICAL DATA:  Right lower quadrant pain for 4 days. Negative pregnancy test.  EXAM: TRANSABDOMINAL  AND TRANSVAGINAL ULTRASOUND OF PELVIS  DOPPLER ULTRASOUND OF OVARIES  TECHNIQUE: Both transabdominal and transvaginal ultrasound examinations of the pelvis were performed. Transabdominal technique was performed for global imaging of the pelvis including uterus, ovaries, adnexal regions, and pelvic cul-de-sac.  It was necessary to proceed with endovaginal exam following the transabdominal exam to visualize the endometrium and ovaries. Color and duplex Doppler ultrasound was utilized to evaluate blood flow to the ovaries.  COMPARISON:  CT abdomen and pelvis 04/12/2015  FINDINGS: Uterus  Measurements: 7 x 3 x 4.1 cm. Anteverted. No fibroids or other mass visualized. Nabothian cysts in the cervix.  Endometrium  Thickness: 9.4 mm.  No focal abnormality visualized.  Right ovary  Measurements: 3.7 x 2.6 x 2.6 cm. Normal follicular changes. Normal appearance/no adnexal mass.  Left ovary  Measurements: 2.4 x 1.8 x 2.4 cm. Normal follicular changes. Normal appearance/no adnexal mass.  Pulsed Doppler evaluation of both ovaries demonstrates normal low-resistance arterial and venous waveforms. Flow is demonstrated in both ovaries  on color flow Doppler images.  Other findings  No free fluid.  IMPRESSION: Normal ultrasound appearance of the uterus and ovaries. No evidence of ovarian mass or torsion.   Electronically Signed   By: Burman Nieves M.D.   On: 04/12/2015 20:30   US Pelvis Complete  04/12/2015   CLINICAL DATA:  Right lower quadrant pain for 4 days. Negative pregnancy test.  EXAM: TRANSABDOMINAL AND TRANSVAGINAL ULTRASOUND OF PELVIS  DOPPLER ULTRASOUND OF OVARIES  TECHNIQUE: Both transabdominal and transvaginal ultrasound examinations of the pelvis were performed. Transabdominal technique was performed for global imaging of the pelvis including uterus, ovaries, adnexal regions, and pelvic cul-de-sac.  It was necessary to proceed with endovaginal exam following the transabdominal exam to visualize the endometrium and ovaries. Color and duplex Doppler ultrasound was utilized to evaluate blood flow to the ovaries.  COMPARISON:  CT abdomen and pelvis 04/12/2015  FINDINGS: Uterus  Measurements: 7 x 3 x 4.1 cm. Anteverted. No fibroids or other mass visualized. Nabothian cysts in the cervix.  Endometrium  Thickness: 9.4 mm.  No focal abnormality visualized.  Right ovary  Measurements: 3.7 x 2.6 x 2.6 cm. Normal follicular changes. Normal appearance/no adnexal mass.  Left ovary  Measurements: 2.4 x 1.8 x 2.4 cm. Normal follicular changes. Normal appearance/no adnexal mass.  Pulsed Doppler evaluation of both ovaries demonstrates normal low-resistance arterial and venous waveforms. Flow is demonstrated in both ovaries on color flow Doppler images.  Other findings  No free fluid.  IMPRESSION: Normal ultrasound appearance of the uterus and ovaries. No evidence of ovarian mass or torsion.   Electronically Signed   By: Burman Nieves M.D.   On: 04/12/2015 20:30   Ct Abdomen Pelvis W Contrast  04/12/2015   CLINICAL DATA:  Acute right lower quadrant abdominal pain.  EXAM: CT ABDOMEN AND PELVIS WITH CONTRAST  TECHNIQUE: Multidetector CT  imaging of the abdomen and pelvis was performed using the standard protocol following bolus administration of intravenous contrast.  CONTRAST:  OMNIPAQUE IOHEXOL 300 MG/ML SOLN, 25mL OMNIPAQUE IOHEXOL 300 MG/ML SOLN  COMPARISON:  None.  FINDINGS: Visualized lung bases appear normal. No significant osseous abnormality is noted.  Status post cholecystectomy. Fatty infiltration of the liver is noted. The spleen and pancreas appear normal. Adrenal glands and kidneys appear normal. No hydronephrosis or renal obstruction is noted. No renal or ureteral calculi are noted. The appendix appears normal. There is no evidence of bowel obstruction. Urinary bladder appears normal. Uterus and ovaries appear normal.  No significant adenopathy is noted. No abnormal fluid collection is noted.  IMPRESSION: Fatty infiltration of the liver. No other significant abnormality seen in the abdomen or pelvis.   Electronically Signed   By: Lupita Raider, M.D.   On: 04/12/2015 18:22   [place] Art/ven Flow Abd Pelv Doppler  04/12/2015   CLINICAL DATA:  Right lower quadrant pain for 4 days. Negative pregnancy test.  EXAM: TRANSABDOMINAL AND TRANSVAGINAL ULTRASOUND OF PELVIS  DOPPLER ULTRASOUND OF OVARIES  TECHNIQUE: Both transabdominal and transvaginal ultrasound examinations of the pelvis were performed. Transabdominal technique was performed for global imaging of the pelvis including uterus, ovaries, adnexal regions, and pelvic cul-de-sac.  It was necessary to proceed with endovaginal exam following the transabdominal exam to visualize the endometrium and ovaries. Color and duplex Doppler ultrasound was utilized to evaluate blood flow to the ovaries.  COMPARISON:  CT abdomen and pelvis 04/12/2015  FINDINGS: Uterus  Measurements: 7 x 3 x 4.1 cm. Anteverted. No fibroids or other mass visualized. Nabothian cysts in the cervix.  Endometrium  Thickness: 9.4 mm.  No focal abnormality visualized.  Right ovary  Measurements: 3.7 x 2.6 x 2.6 cm.  Normal follicular changes. Normal appearance/no adnexal mass.  Left ovary  Measurements: 2.4 x 1.8 x 2.4 cm. Normal follicular changes. Normal appearance/no adnexal mass.  Pulsed Doppler evaluation of both ovaries demonstrates normal low-resistance arterial and venous waveforms. Flow is demonstrated in both ovaries on color flow Doppler images.  Other findings  No free fluid.  IMPRESSION: Normal ultrasound appearance of the uterus and ovaries. No evidence of ovarian mass or torsion.   Electronically Signed   By: Burman Nieves M.D.   On: 04/12/2015 20:30   I have personally reviewed and evaluated these images and lab results as part of my medical decision-making.   EKG Interpretation None      MDM   Final diagnoses:  Pelvic pain in female    Presented to the ER for evaluation of right lower abdominal pain. She was recently treated for urinary tract infection. Urinalysis does not suggest continued infection. Patient underwent CT scan to rule out appendicitis. No acute pathology was noted. She continued to have significant pain requiring multiple doses of pain medication. She was therefore sent for ultrasound including Doppler to evaluate ovaries. No abnormality was seen on the ultrasound. Etiology of pain is unclear, but there is no surgical process. Patient will be discharged, pain management.    Gilda Crease, MD 04/14/15 [phone]

## 2015-04-13 LAB — GC/CHLAMYDIA PROBE AMP (~~LOC~~) NOT AT ARMC
Chlamydia: NEGATIVE
Neisseria Gonorrhea: NEGATIVE

## 2015-04-14 LAB — URINE CULTURE: Special Requests: NORMAL

## 2015-08-03 ENCOUNTER — Other Ambulatory Visit: Payer: Self-pay | Admitting: Gynecology

## 2015-08-03 DIAGNOSIS — R928 Other abnormal and inconclusive findings on diagnostic imaging of breast: Secondary | ICD-10-CM

## 2015-08-09 ENCOUNTER — Ambulatory Visit
Admission: RE | Admit: 2015-08-09 | Discharge: 2015-08-09 | Disposition: A | Discharge status: Home / Self Care | Payer: Medicaid Other | Source: Ambulatory Visit | Attending: Gynecology | Admitting: Gynecology

## 2015-08-09 ENCOUNTER — Other Ambulatory Visit: Payer: Self-pay | Admitting: Gynecology

## 2015-08-09 DIAGNOSIS — R928 Other abnormal and inconclusive findings on diagnostic imaging of breast: Secondary | ICD-10-CM

## 2015-08-17 ENCOUNTER — Ambulatory Visit
Admission: RE | Admit: 2015-08-17 | Discharge: 2015-08-17 | Disposition: A | Discharge status: Home / Self Care | Payer: Medicaid Other | Source: Ambulatory Visit | Attending: Gynecology | Admitting: Gynecology

## 2015-08-17 ENCOUNTER — Other Ambulatory Visit: Payer: Self-pay | Admitting: Gynecology

## 2015-08-17 DIAGNOSIS — R928 Other abnormal and inconclusive findings on diagnostic imaging of breast: Secondary | ICD-10-CM

## 2015-12-04 ENCOUNTER — Encounter (HOSPITAL_COMMUNITY): Payer: Self-pay | Admitting: *Deleted

## 2015-12-04 ENCOUNTER — Ambulatory Visit (INDEPENDENT_AMBULATORY_CARE_PROVIDER_SITE_OTHER): Payer: Medicaid Other

## 2015-12-04 ENCOUNTER — Ambulatory Visit (HOSPITAL_COMMUNITY)
Admission: EM | Admit: 2015-12-04 | Discharge: 2015-12-04 | Disposition: A | Discharge status: Home / Self Care | Payer: Medicaid Other | Attending: Emergency Medicine | Admitting: Emergency Medicine

## 2015-12-04 DIAGNOSIS — S99921A Unspecified injury of right foot, initial encounter: Secondary | ICD-10-CM

## 2015-12-04 HISTORY — DX: Autoimmune thyroiditis: E06.3

## 2015-12-04 HISTORY — DX: Sjogren syndrome, unspecified: M35.00

## 2015-12-04 HISTORY — DX: Endometriosis, unspecified: N80.9

## 2015-12-04 NOTE — Discharge Instructions (Signed)
X-ray is negative for fracture. You likely have bruising as well as an ankle sprain. Use the boot for comfort. Continue to ice and elevate as much as you can. Use ibuprofen or tramadol as needed for pain. This will take 2 weeks to resolve. Follow-up as needed.

## 2015-12-04 NOTE — ED Notes (Signed)
Assessment per Dr. Honig. 

## 2015-12-04 NOTE — ED Provider Notes (Signed)
CSN: [id number]     Arrival date & time 12/04/15  1515 History   First MD Initiated Contact with Patient 12/04/15 1703     Chief Complaint  Patient presents with  . Foot Injury   (Consider location/radiation/quality/duration/timing/severity/associated sxs/prior Treatment) HPI She is a 40 year old woman here for evaluation of right foot injury. She has an out pack of forearm. 3 days ago they were shearing and that evening she started having pain in the right foot and ankle. She has been doing elevation and ice as well as ibuprofen, but the pain has progressively gotten worse. The pain is primarily located over the metatarsals. It is worse with weightbearing. She also has some pain distal to the lateral malleolus.  She denies any history of hypertension. She does have documented white coat hypertension per her report. She states her PCP has had her monitor her blood pressure at home and it is normal at home.  Past Medical History  Diagnosis Date  . Autoimmune disorder (HCC)   . Fibromyalgia   . Scleroderma (HCC)   . Thyroiditis   . Hashimoto thyroiditis, fibrous variant   . Hashimoto's disease   . Sjogren's syndrome (HCC)   . Fibromyalgia   . Endometriosis    Past Surgical History  Procedure Laterality Date  . Mandible fracture surgery  1994    upper mandibular  . Cholecystectomy    . Cesarean section     Family History  Problem Relation Age of Onset  . Diabetes Mother     Type II  . Heart disease Mother     stent surgery  . Hypertension Mother   . Hyperlipidemia Mother   . Diabetes Father     Type II  . Hypertension Father   . Hyperlipidemia Father    Social History  Substance Use Topics  . Smoking status: Former Games developer  . Smokeless tobacco: None  . Alcohol Use: No   OB History    No data available     Review of Systems As in history of present illness Allergies  Sulfa antibiotics  Home Medications   Prior to Admission medications   Medication Sig Start  Date End Date Taking? Authorizing Provider  cyclobenzaprine (FLEXERIL) 10 MG tablet Take 10 mg by mouth 3 (three) times daily as needed for muscle spasms.   Yes Historical Provider, MD  etonogestrel-ethinyl estradiol (NUVARING) 0.12-0.015 MG/24HR vaginal ring Place 1 each vaginally every 28 (twenty-eight) days. Insert vaginally and leave in place for 3 consecutive weeks, then remove for 1 week.   Yes Historical Provider, MD  gabapentin (NEURONTIN) 300 MG capsule Take 300 mg by mouth 3 (three) times daily.  11/20/14 12/04/15 Yes Historical Provider, MD  IBUPROFEN PO Take by mouth.   Yes Historical Provider, MD  traMADol (ULTRAM) 50 MG tablet Take 50 mg by mouth 2 (two) times daily as needed for moderate pain.    Yes Historical Provider, MD  ciprofloxacin (CIPRO) 500 MG tablet Take 500 mg by mouth 2 (two) times daily.    Historical Provider, MD  clobetasol ointment (TEMOVATE) 0.05 % Apply 1 application topically daily as needed (for scleraderma).    Historical Provider, MD  Cyanocobalamin (B-12) 500 MCG SUBL Place 1 each under the tongue.    Historical Provider, MD  cycloSPORINE (RESTASIS) 0.05 % ophthalmic emulsion Place 1 drop into both eyes 2 (two) times daily.    Historical Provider, MD  DULoxetine (CYMBALTA) 60 MG capsule Take 60 mg by mouth every evening.  Historical Provider, MD  fluorometholone (EFLONE) 0.1 % ophthalmic suspension Place 1 drop into both eyes 2 (two) times daily.    Historical Provider, MD  Homeopathic Products (ARNICARE ARNICA) CREA Apply 1 application topically daily as needed (for pain).    Historical Provider, MD  levothyroxine (LEVOTHROID) 25 MCG tablet Take 1 tablet (25 mcg total) by mouth daily before breakfast. 01/29/15   Carlus Pavlov, MD  naproxen sodium (ANAPROX) 220 MG tablet Take 220 mg by mouth 2 (two) times daily as needed (for pain).     Historical Provider, MD  predniSONE (DELTASONE) 20 MG tablet 3 tabs po daily x 3 days, then 2 tabs x 3 days, then 1.5 tabs x 3  days, then 1 tab x 3 days, then 0.5 tabs x 3 days 04/12/15   Gilda Crease, MD  Vitamin D, Ergocalciferol, (DRISDOL) 50000 UNITS CAPS capsule Take 50,000 Units by mouth every Sunday.     Historical Provider, MD   Meds Ordered and Administered this Visit  Medications - No data to display  BP 176/103 mmHg  Pulse 90  Temp(Src) 100 F (37.8 C) (Oral)  SpO2 98%  LMP 11/26/2015 (Exact Date) No data found.   Physical Exam  Constitutional: She is oriented to person, place, and time. She appears well-developed and well-nourished. No distress.  Cardiovascular: Normal rate.   Pulmonary/Chest: Effort normal.  Musculoskeletal:  Right ankle: No erythema or edema. No bony tenderness. She is mildly tender along the ligaments distal to the lateral malleolus. Right foot: She has a appears to be a ganglion cyst on the dorsal foot. This is nontender. No bruising or swelling. She is tender over the second third and fourth metatarsals. 2+ DP pulse.  Neurological: She is alert and oriented to person, place, and time.    ED Course  Procedures (including critical care time)  Labs Review Labs Reviewed - No data to display  Imaging Review Dg Foot Complete Right  12/04/2015  CLINICAL DATA:  Acute onset of right foot pain.  Initial encounter. EXAM: RIGHT FOOT COMPLETE - 3+ VIEW COMPARISON:  None. FINDINGS: There is no evidence of fracture or dislocation. The joint spaces are preserved. There is no evidence of talar subluxation; the subtalar joint is unremarkable in appearance. A small os trigonum is noted. A plantar calcaneal spur is seen. Mild diffuse soft tissue swelling is suggested. IMPRESSION: 1. No evidence of fracture or dislocation. 2. Small os trigonum noted. Electronically Signed   By: Roanna Raider M.D.   On: 12/04/2015 18:26    MDM   1. Right foot injury, initial encounter    Cam Walker given for comfort. Recommended ice and elevation. Ibuprofen or home tramadol as needed for  pain. Follow-up with PCP as needed.    Charm Rings, MD 12/04/15 [phone]

## 2016-06-08 IMAGING — CT CT ABD-PELV W/ CM
2 of 5 series · 16 of 46 positions shown, 18 images · IV contrast (Omni 300)
Comparison: None.

CLINICAL DATA: Acute right lower quadrant abdominal pain.

EXAM:
CT ABDOMEN AND PELVIS WITH CONTRAST
TECHNIQUE: Multidetector CT imaging of the abdomen and pelvis was performed
using the standard protocol following bolus administration of
intravenous contrast.
CONTRAST:  100mL OMNIPAQUE IOHEXOL 300 MG/ML SOLN, 25mL OMNIPAQUE
IOHEXOL 300 MG/ML SOLN

[Series 2: abd/ pelvis 5.0 i30f 1 · axial · 0.93mm/px · z∈[+729,+1179]mm · 13 of 102 slices shown, 15 images]
[im 6/102  soft-tissue]
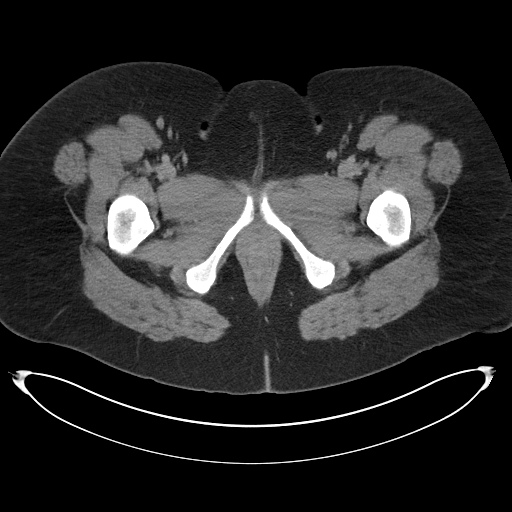
[im 6/102  bone]
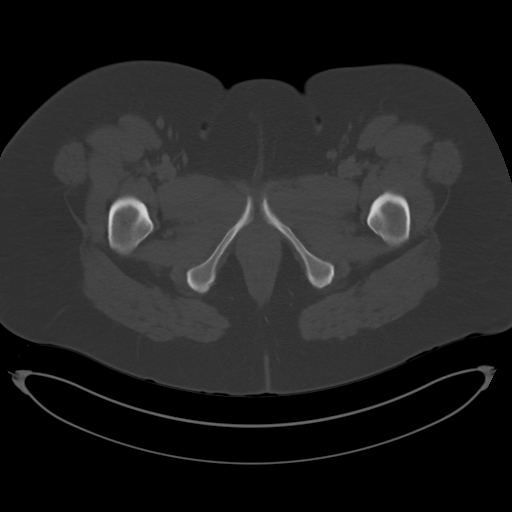
[im 16/102  soft-tissue]
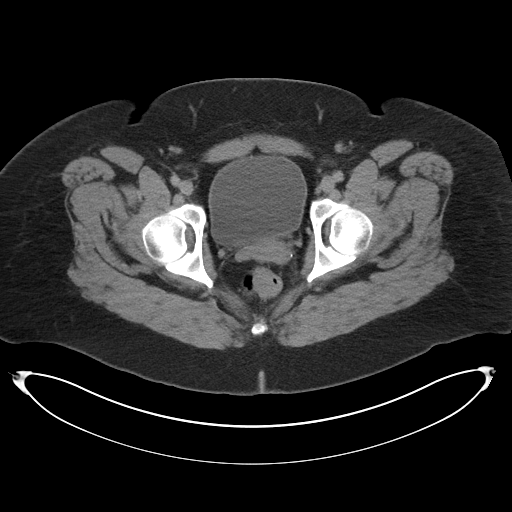
[im 22/102  soft-tissue]
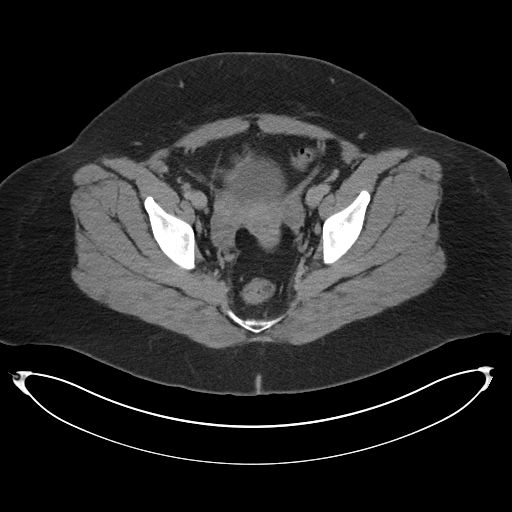
[im 27/102  soft-tissue]
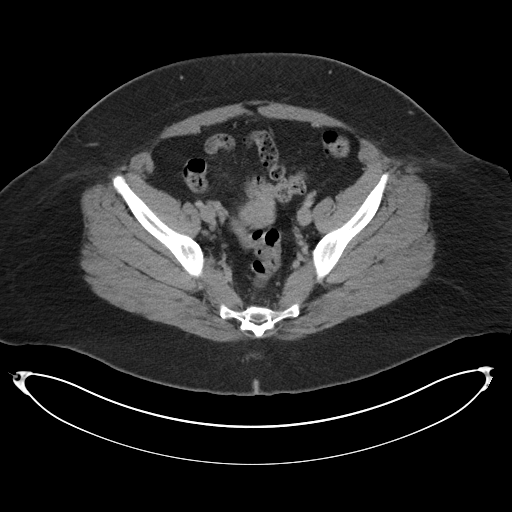
[im 38/102  soft-tissue]
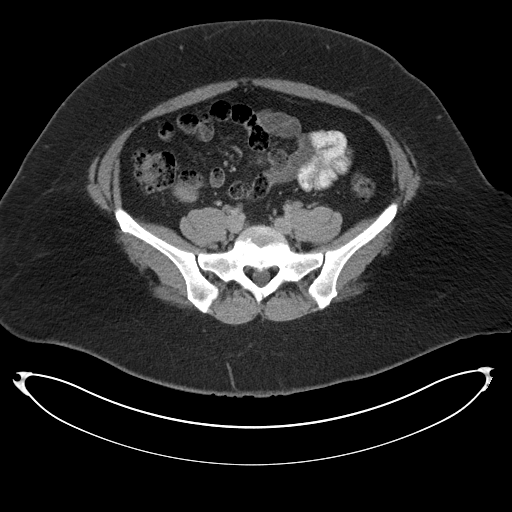
[im 43/102  soft-tissue]
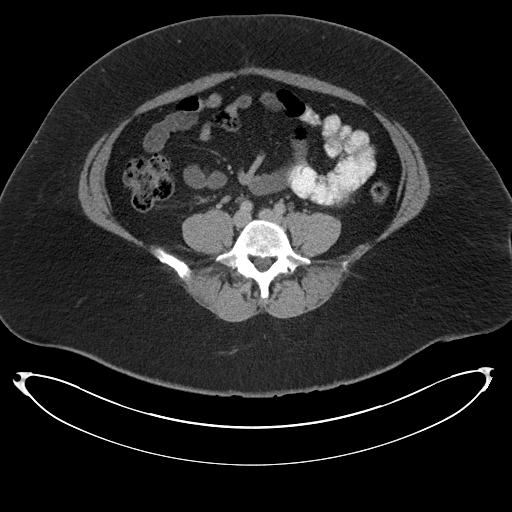
[im 54/102  soft-tissue]
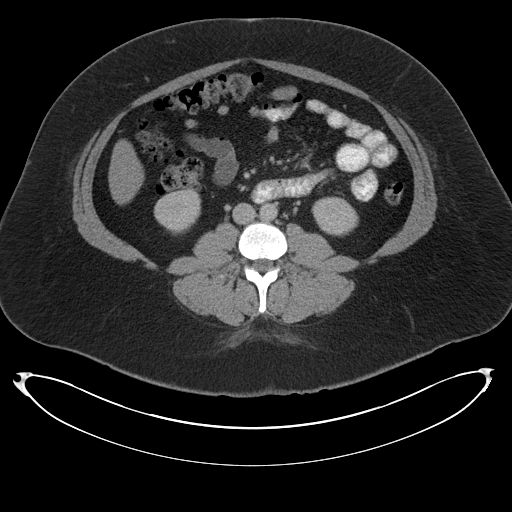
[im 59/102  soft-tissue]
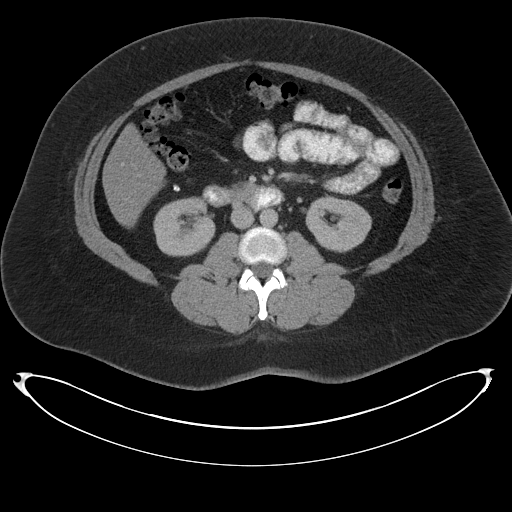
[im 64/102  soft-tissue]
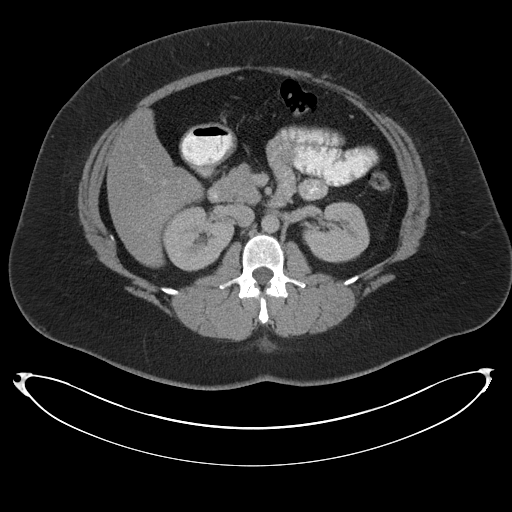
[im 64/102  bone]
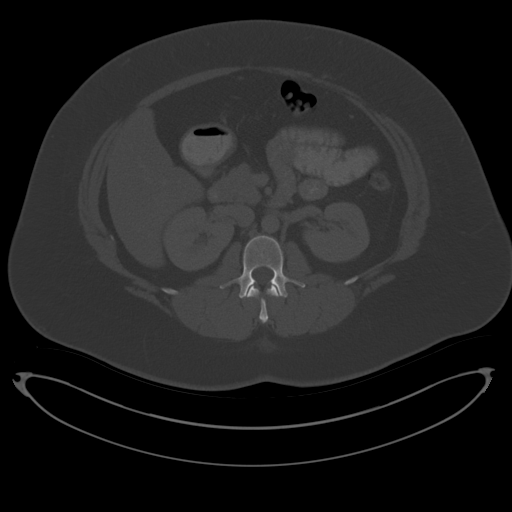
[im 75/102  soft-tissue]
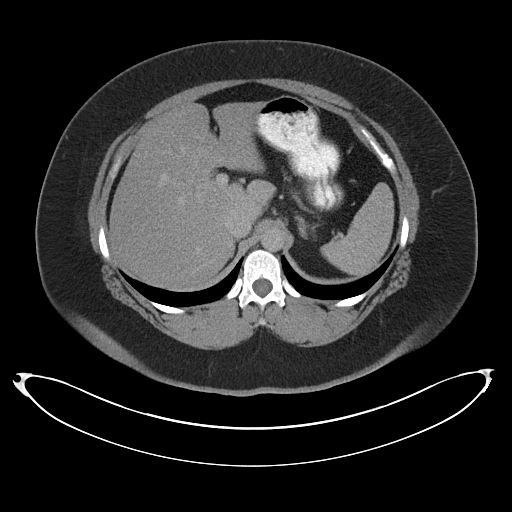
[im 80/102  soft-tissue]
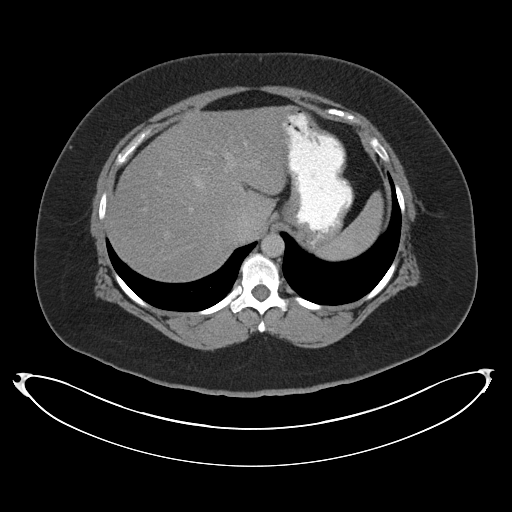
[im 86/102  soft-tissue]
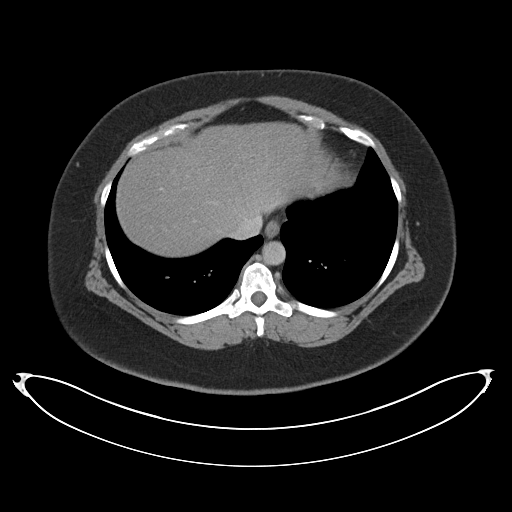
[im 96/102  soft-tissue]
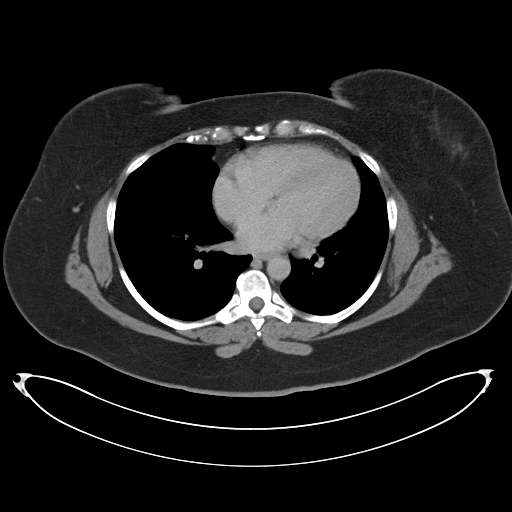

[Series 6: coronals · coronal · 0.72mm/px · 3 of 181 slices shown]
[im 61/181  soft-tissue]
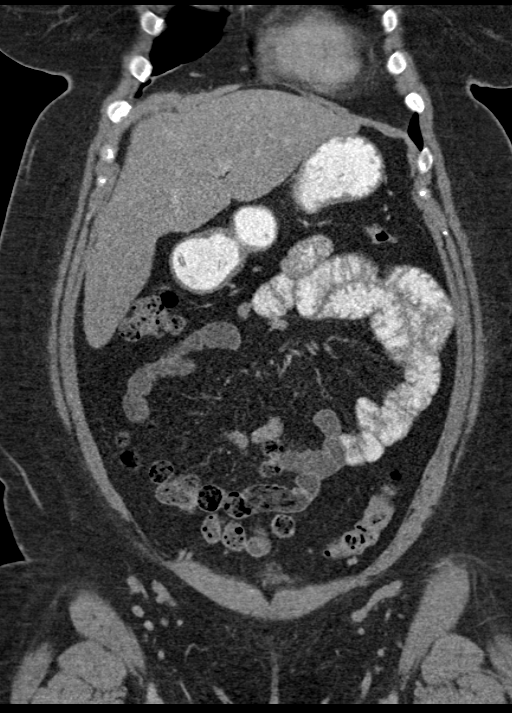
[im 81/181  soft-tissue]
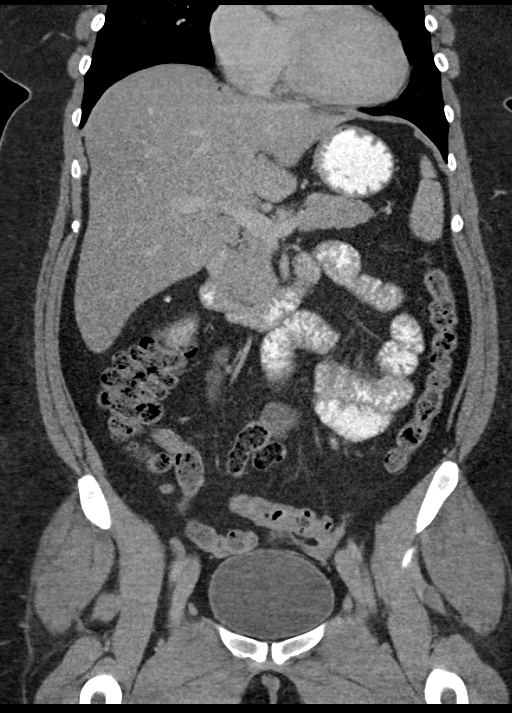
[im 101/181  soft-tissue]
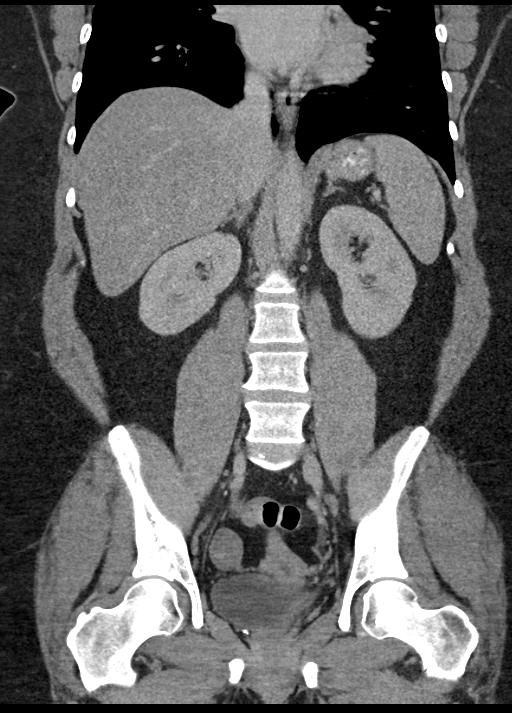

[16 of 46 positions shown; findings below may reference images not displayed]

FINDINGS: Visualized lung bases appear normal. No significant osseous
abnormality is noted.

Status post cholecystectomy. Fatty infiltration of the liver is
noted. The spleen and pancreas appear normal. Adrenal glands and
kidneys appear normal. No hydronephrosis or renal obstruction is
noted. No renal or ureteral calculi are noted. The appendix appears
normal. There is no evidence of bowel obstruction. Urinary bladder
appears normal. Uterus and ovaries appear normal. No significant
adenopathy is noted. No abnormal fluid collection is noted.
IMPRESSION: Fatty infiltration of the liver. No other significant abnormality
seen in the abdomen or pelvis.

## 2016-07-21 ENCOUNTER — Ambulatory Visit (HOSPITAL_COMMUNITY)
Admission: EM | Admit: 2016-07-21 | Discharge: 2016-07-21 | Disposition: A | Discharge status: Other Acute Inpatient Hospital | Payer: Medicaid Other

## 2016-07-21 ENCOUNTER — Emergency Department (HOSPITAL_COMMUNITY)
Admission: EM | Admit: 2016-07-21 | Discharge: 2016-07-21 | Disposition: A | Discharge status: Home / Self Care | Payer: Medicaid Other | Attending: Emergency Medicine | Admitting: Emergency Medicine

## 2016-07-21 ENCOUNTER — Encounter (HOSPITAL_COMMUNITY): Payer: Self-pay | Admitting: *Deleted

## 2016-07-21 ENCOUNTER — Emergency Department (HOSPITAL_COMMUNITY): Payer: Medicaid Other

## 2016-07-21 DIAGNOSIS — R0789 Other chest pain: Secondary | ICD-10-CM | POA: Diagnosis not present

## 2016-07-21 DIAGNOSIS — Z87891 Personal history of nicotine dependence: Secondary | ICD-10-CM | POA: Insufficient documentation

## 2016-07-21 DIAGNOSIS — R072 Precordial pain: Secondary | ICD-10-CM | POA: Diagnosis present

## 2016-07-21 LAB — I-STAT TROPONIN, ED: TROPONIN I, POC: 0 ng/mL (ref 0.00–0.08)

## 2016-07-21 LAB — BASIC METABOLIC PANEL
Anion gap: 9 (ref 5–15)
BUN: 10 mg/dL (ref 6–20)
CALCIUM: 9.2 mg/dL (ref 8.9–10.3)
CO2: 24 mmol/L (ref 22–32)
Chloride: 106 mmol/L (ref 101–111)
Creatinine, Ser: 0.94 mg/dL (ref 0.44–1.00)
GFR calc Af Amer: >60 mL/min (ref >60)
GLUCOSE: 94 mg/dL (ref 65–99)
Potassium: 3.6 mmol/L (ref 3.5–5.1)
SODIUM: 139 mmol/L (ref 135–145)

## 2016-07-21 LAB — CBC
HCT: 42.2 % (ref 36.0–46.0)
Hemoglobin: 14 g/dL (ref 12.0–15.0)
MCH: 29.2 pg (ref 26.0–34.0)
MCHC: 33.2 g/dL (ref 30.0–36.0)
MCV: 87.9 fL (ref 78.0–100.0)
PLATELETS: 337 10*3/uL (ref 150–400)
RBC: 4.8 MIL/uL (ref 3.87–5.11)
RDW: 13 % (ref 11.5–15.5)
WBC: 6.6 10*3/uL (ref 4.0–10.5)

## 2016-07-21 LAB — TROPONIN I: Troponin I: <0.03 ng/mL (ref <0.03)

## 2016-07-21 MED ORDER — IBUPROFEN 400 MG PO TABS
ORAL_TABLET | ORAL | Status: AC
  Filled 2016-07-21: qty 1

## 2016-07-21 MED ORDER — IOPAMIDOL (ISOVUE-370) INJECTION 76%
INTRAVENOUS | Status: AC
  Administered 2016-07-21: 100 mL
  Filled 2016-07-21: qty 100

## 2016-07-21 MED ORDER — IBUPROFEN 400 MG PO TABS
400.0000 mg | ORAL_TABLET | Freq: Once | ORAL | Status: AC | PRN
  Administered 2016-07-21: 400 mg via ORAL

## 2016-07-21 MED ORDER — IBUPROFEN 800 MG PO TABS
800.0000 mg | ORAL_TABLET | Freq: Once | ORAL | Status: AC
  Administered 2016-07-21: 800 mg via ORAL
  Filled 2016-07-21: qty 1

## 2016-07-21 NOTE — ED Notes (Signed)
Pt verbalized understanding of d/c instructions and has no further questions. Pt is stable, A&Ox4, VSS.  

## 2016-07-21 NOTE — ED Notes (Signed)
Captured another 12 lead per MD.

## 2016-07-21 NOTE — ED Provider Notes (Signed)
[facility]-EMERGENCY DEPT Provider Note   CSN: [id number] Arrival date & time: 07/21/16  1140     History   Chief Complaint Chief Complaint  Patient presents with  . Chest Pain  . Headache    HPI Sarah English is a 40 y.o. female.  40 year old female with history of obesity, autoimmune disorder, fibromyalgia who presents with one-day history of constant substernal chest pain with episodes of intensity lasting 20-30 minutes. Symptoms occur with activity at times. She had diaphoresis and slight dyspnea. Does have a history of early heart disease in her family. She denies any prior cardiac disease but does have a history of tobacco use past. No recent fever or cough. Pain does get worse when he takes a deep breath. Denies any cough or congestion. Has used aspirin with relief. No prior history of DVT or PE      Past Medical History:  Diagnosis Date  . Autoimmune disorder (HCC)   . Endometriosis   . Fibromyalgia   . Fibromyalgia   . Hashimoto thyroiditis, fibrous variant   . Hashimoto's disease   . Scleroderma (HCC)   . Sjogren's syndrome (HCC)   . Thyroiditis     Patient Active Problem List   Diagnosis Date Noted  . Vitamin D insufficiency 02/10/2014  . Severe obesity (BMI >= 40) (HCC) 02/10/2014  . Hashimoto's thyroiditis 11/24/2013  . Other malaise and fatigue 11/24/2013    Past Surgical History:  Procedure Laterality Date  . CESAREAN SECTION    . CHOLECYSTECTOMY    . MANDIBLE FRACTURE SURGERY  1994   upper mandibular    OB History    No data available       Home Medications    Prior to Admission medications   Medication Sig Start Date End Date Taking? Authorizing Provider  ciprofloxacin (CIPRO) 500 MG tablet Take 500 mg by mouth 2 (two) times daily.    Historical Provider, MD  clobetasol ointment (TEMOVATE) 0.05 % Apply 1 application topically daily as needed (for scleraderma).    Historical Provider, MD  Cyanocobalamin (B-12) 500 MCG SUBL Place 1 each under  the tongue.    Historical Provider, MD  cyclobenzaprine (FLEXERIL) 10 MG tablet Take 10 mg by mouth 3 (three) times daily as needed for muscle spasms.    Historical Provider, MD  cycloSPORINE (RESTASIS) 0.05 % ophthalmic emulsion Place 1 drop into both eyes 2 (two) times daily.    Historical Provider, MD  DULoxetine (CYMBALTA) 60 MG capsule Take 60 mg by mouth every evening.     Historical Provider, MD  etonogestrel-ethinyl estradiol (NUVARING) 0.12-0.015 MG/24HR vaginal ring Place 1 each vaginally every 28 (twenty-eight) days. Insert vaginally and leave in place for 3 consecutive weeks, then remove for 1 week.    Historical Provider, MD  fluorometholone (EFLONE) 0.1 % ophthalmic suspension Place 1 drop into both eyes 2 (two) times daily.    Historical Provider, MD  gabapentin (NEURONTIN) 300 MG capsule Take 300 mg by mouth 3 (three) times daily.  11/20/14 12/04/15  Historical Provider, MD  Homeopathic Products (ARNICARE ARNICA) CREA Apply 1 application topically daily as needed (for pain).    Historical Provider, MD  IBUPROFEN PO Take by mouth.    Historical Provider, MD  levothyroxine (LEVOTHROID) 25 MCG tablet Take 1 tablet (25 mcg total) by mouth daily before breakfast. 01/29/15   Carlus Pavlovristina Gherghe, MD  naproxen sodium (ANAPROX) 220 MG tablet Take 220 mg by mouth 2 (two) times daily as needed (for pain).  Historical Provider, MD  predniSONE (DELTASONE) 20 MG tablet 3 tabs po daily x 3 days, then 2 tabs x 3 days, then 1.5 tabs x 3 days, then 1 tab x 3 days, then 0.5 tabs x 3 days 04/12/15   Gilda Crease, MD  traMADol (ULTRAM) 50 MG tablet Take 50 mg by mouth 2 (two) times daily as needed for moderate pain.     Historical Provider, MD  Vitamin D, Ergocalciferol, (DRISDOL) 50000 UNITS CAPS capsule Take 50,000 Units by mouth every Sunday.     Historical Provider, MD    Family History Family History  Problem Relation Age of Onset  . Diabetes Mother     Type II  . Heart disease Mother      stent surgery  . Hypertension Mother   . Hyperlipidemia Mother   . Diabetes Father     Type II  . Hypertension Father   . Hyperlipidemia Father     Social History Social History  Substance Use Topics  . Smoking status: Former Games developer  . Smokeless tobacco: Not on file  . Alcohol use No     Allergies   Sulfa antibiotics   Review of Systems Review of Systems  All other systems reviewed and are negative.    Physical Exam Updated Vital Signs BP 168/88 (BP Location: Left Arm)   Pulse 89   Temp 98.7 F (37.1 C) (Oral)   Resp 19   Ht 5\' 5"  (1.651 m)   SpO2 100%   Physical Exam  Constitutional: She is oriented to person, place, and time. She appears well-developed and well-nourished.  Non-toxic appearance. No distress.  HENT:  Head: Normocephalic and atraumatic.  Eyes: Conjunctivae, EOM and lids are normal. Pupils are equal, round, and reactive to light.  Neck: Normal range of motion. Neck supple. No tracheal deviation present. No thyroid mass present.  Cardiovascular: Normal rate, regular rhythm and normal heart sounds.  Exam reveals no gallop.   No murmur heard. Pulmonary/Chest: Effort normal and breath sounds normal. No stridor. No respiratory distress. She has no decreased breath sounds. She has no wheezes. She has no rhonchi. She has no rales.  Abdominal: Soft. Normal appearance and bowel sounds are normal. She exhibits no distension. There is no tenderness. There is no rebound and no CVA tenderness.  Musculoskeletal: Normal range of motion. She exhibits no edema or tenderness.  Neurological: She is alert and oriented to person, place, and time. She has normal strength. No cranial nerve deficit or sensory deficit. GCS eye subscore is 4. GCS verbal subscore is 5. GCS motor subscore is 6.  Skin: Skin is warm and dry. No abrasion and no rash noted.  Psychiatric: She has a normal mood and affect. Her speech is normal and behavior is normal.  Nursing note and vitals  reviewed.    ED Treatments / Results  Labs (all labs ordered are listed, but only abnormal results are displayed) Labs Reviewed  BASIC METABOLIC PANEL  CBC  I-STAT TROPOININ, ED    EKG  EKG Interpretation  Date/Time:  Friday July 21 2016 11:43:26 EST Ventricular Rate:  97 PR Interval:  118 QRS Duration: 80 QT Interval:  358 QTC Calculation: 454 R Axis:   62 Text Interpretation:  Normal sinus rhythm Cannot rule out Anterior infarct , age undetermined Abnormal ECG Confirmed by Freida Busman  MD, Tiffay Pinette ([id number]) on 07/21/2016 4:09:21 PM       Radiology Dg Chest 2 View  Result Date: 07/21/2016 CLINICAL DATA:  Left  chest pain starting yesterday EXAM: CHEST  2 VIEW COMPARISON:  None. FINDINGS: The heart size and mediastinal contours are within normal limits. Both lungs are clear. The visualized skeletal structures are unremarkable. IMPRESSION: No active cardiopulmonary disease. Electronically Signed   By: Natasha Mead M.D.   On: 07/21/2016 13:44    Procedures Procedures (including critical care time)  Medications Ordered in ED Medications  ibuprofen (ADVIL,MOTRIN) 400 MG tablet (not administered)  ibuprofen (ADVIL,MOTRIN) tablet 400 mg (400 mg Oral Given 07/21/16 1440)     Initial Impression / Assessment and Plan / ED Course  I have reviewed the triage vital signs and the nursing notes.  Pertinent labs & imaging results that were available during my care of the patient were reviewed by me and considered in my medical decision making (see chart for details).  Clinical Course     Chest CT negative for PE. Delta troponin negative. Patient's symptoms are atypical and nothing that they represent decrease syndrome. Patient's heart score is 3. She is stable for discharge and follow-up with her Dr.  Final Clinical Impressions(s) / ED Diagnoses   Final diagnoses:  None    New Prescriptions New Prescriptions   No medications on file     Lorre Nick, MD 07/21/16 2128

## 2016-07-21 NOTE — ED Triage Notes (Signed)
Pt has hx of autoimmune disease. Onset yesterday of left side neck pain, chest pain, dizziness and headache. Reports hx of pain but this is abnormal for her. Also reports recent HTN. bp 179/97 at triage and ekg done.

## 2017-01-26 ENCOUNTER — Other Ambulatory Visit: Payer: Self-pay | Admitting: Physician Assistant

## 2017-01-26 DIAGNOSIS — R1030 Lower abdominal pain, unspecified: Secondary | ICD-10-CM

## 2017-01-26 DIAGNOSIS — K5792 Diverticulitis of intestine, part unspecified, without perforation or abscess without bleeding: Secondary | ICD-10-CM

## 2017-01-26 DIAGNOSIS — K625 Hemorrhage of anus and rectum: Secondary | ICD-10-CM

## 2017-01-31 ENCOUNTER — Ambulatory Visit
Admission: RE | Admit: 2017-01-31 | Discharge: 2017-01-31 | Disposition: A | Discharge status: Home / Self Care | Payer: Medicaid Other | Source: Ambulatory Visit | Attending: Physician Assistant | Admitting: Physician Assistant

## 2017-01-31 DIAGNOSIS — R1030 Lower abdominal pain, unspecified: Secondary | ICD-10-CM

## 2017-01-31 DIAGNOSIS — K5792 Diverticulitis of intestine, part unspecified, without perforation or abscess without bleeding: Secondary | ICD-10-CM

## 2017-01-31 DIAGNOSIS — K625 Hemorrhage of anus and rectum: Secondary | ICD-10-CM

## 2017-01-31 MED ORDER — IOPAMIDOL (ISOVUE-300) INJECTION 61%
125.0000 mL | Freq: Once | INTRAVENOUS | Status: AC | PRN
  Administered 2017-01-31: 125 mL via INTRAVENOUS

## 2017-11-02 ENCOUNTER — Encounter (HOSPITAL_COMMUNITY): Payer: Self-pay | Admitting: Emergency Medicine

## 2017-11-02 ENCOUNTER — Ambulatory Visit (INDEPENDENT_AMBULATORY_CARE_PROVIDER_SITE_OTHER): Payer: Medicaid Other

## 2017-11-02 ENCOUNTER — Ambulatory Visit (HOSPITAL_COMMUNITY)
Admission: EM | Admit: 2017-11-02 | Discharge: 2017-11-02 | Disposition: A | Discharge status: Home / Self Care | Payer: Medicaid Other | Attending: Family Medicine | Admitting: Family Medicine

## 2017-11-02 DIAGNOSIS — R059 Cough, unspecified: Secondary | ICD-10-CM

## 2017-11-02 DIAGNOSIS — R0602 Shortness of breath: Secondary | ICD-10-CM | POA: Diagnosis not present

## 2017-11-02 DIAGNOSIS — R05 Cough: Secondary | ICD-10-CM

## 2017-11-02 MED ORDER — AEROCHAMBER PLUS FLO-VU MEDIUM MISC
1.0000 | Freq: Once | Status: AC
  Administered 2017-11-02: 1

## 2017-11-02 MED ORDER — IPRATROPIUM BROMIDE 0.06 % NA SOLN: 2.0000 | Freq: Four times a day (QID) | NASAL | 0 refills | Status: DC

## 2017-11-02 MED ORDER — PREDNISONE 50 MG PO TABS: 50.0000 mg | ORAL_TABLET | Freq: Every day | ORAL | 0 refills | Status: AC

## 2017-11-02 MED ORDER — HYDROCOD POLST-CPM POLST ER 10-8 MG/5ML PO SUER: 5.0000 mL | Freq: Every evening | ORAL | 0 refills | Status: DC | PRN

## 2017-11-02 MED ORDER — AEROCHAMBER PLUS FLO-VU LARGE MISC
Status: AC
  Filled 2017-11-02: qty 1

## 2017-11-02 NOTE — ED Provider Notes (Signed)
[facility]    CSN: [id number] Arrival date & time: 11/02/17  1737     History   Chief Complaint Chief Complaint  Patient presents with  . Cough    HPI Sarah English is a 42 y.o. female.   42 year old female with history of autoimmune disorder, fibromyalgia, endometriosis, Hashimoto's thyroiditis, scleroderma, Sjogren's syndrome comes in for 3.5-week history of URI symptoms.  States that she went to see her PCP, and was treated for pneumonia due to lung sounds.  At that time she had productive cough, shortness of breath, rhinorrhea, nasal congestion, fever, chills.  Negative for flu.  She states she got an injection of antibiotics, and was also on Z-Pak and cephalexin.  States  symptoms improved, but restarted 2 days after finishing antibiotics.  States fever has not come back.  She was seen 4 days ago by her PCP, and was told to continue OTC medications and albuterol for her symptoms.  States currently still with productive cough and postnasal drip.  States that the productive cough is worse with deep breathing, and unable to get relief from cough.  Denies recent fever, chills, night sweats. Former smoker.       Past Medical History:  Diagnosis Date  . Autoimmune disorder (HCC)   . Endometriosis   . Fibromyalgia   . Fibromyalgia   . Hashimoto thyroiditis, fibrous variant   . Hashimoto's disease   . Scleroderma (HCC)   . Sjogren's syndrome (HCC)   . Thyroiditis     Patient Active Problem List   Diagnosis Date Noted  . Vitamin D insufficiency 02/10/2014  . Severe obesity (BMI >= 40) (HCC) 02/10/2014  . Hashimoto's thyroiditis 11/24/2013  . Other malaise and fatigue 11/24/2013    Past Surgical History:  Procedure Laterality Date  . CESAREAN SECTION    . CHOLECYSTECTOMY    . MANDIBLE FRACTURE SURGERY  1994   upper mandibular    OB History    No data available       Home Medications    Prior to Admission medications   Medication Sig Start Date End  Date Taking? Authorizing Provider  albuterol (PROVENTIL HFA;VENTOLIN HFA) 108 (90 Base) MCG/ACT inhaler Inhale into the lungs every 6 (six) hours as needed for wheezing or shortness of breath.   Yes [provider]  cholecalciferol (VITAMIN D) 1000 units tablet Take 1,000 Units by mouth 3 (three) times daily.   Yes [provider]  cyclobenzaprine (FLEXERIL) 10 MG tablet Take 10 mg by mouth 3 (three) times daily as needed for muscle spasms.   Yes [provider]  cycloSPORINE (RESTASIS) 0.05 % ophthalmic emulsion Place 1 drop into both eyes 2 (two) times daily.    Yes [provider]  dextromethorphan-guaiFENesin (MUCINEX DM) 30-600 MG 12hr tablet Take 1 tablet by mouth 2 (two) times daily.   Yes [provider]  Dextromethorphan-Menthol (DELSYM COUGH RELIEF MT) Use as directed in the mouth or throat.   Yes [provider]  etonogestrel-ethinyl estradiol (NUVARING) 0.12-0.015 MG/24HR vaginal ring Place 1 each vaginally every 21 ( twenty-one) days. Insert vaginally and leave in place for 3 consecutive week, NO OFF TIME   Yes [provider]  gabapentin (NEURONTIN) 300 MG capsule Take 300 mg by mouth 3 (three) times daily.  11/20/14 11/02/17 Yes [provider]  LISINOPRIL PO Take by mouth.   Yes [provider]  Olopatadine HCl (PAZEO) 0.7 % SOLN Apply 1 drop to eye 2 (two) times daily as  needed (DRY EYES).   Yes [provider]  silver sulfADIAZINE (SILVADENE) 1 % cream Apply 1 application topically daily as needed (PAIN).   Yes [provider]  sodium chloride (AYR) 0.65 % nasal spray Place 1 spray into the nose daily.   Yes [provider]  traMADol (ULTRAM) 50 MG tablet Take 50 mg by mouth 2 (two) times daily as needed for moderate pain.    Yes [provider]  chlorpheniramine-HYDROcodone (TUSSIONEX PENNKINETIC ER) 10-8 MG/5ML SUER Take 5 mLs by mouth at bedtime as needed for cough.  11/02/17   Cathie Hoops,  V, PA-C  ipratropium (ATROVENT) 0.06 % nasal spray Place 2 sprays into both nostrils 4 (four) times daily. 11/02/17   Cathie Hoops,  V, PA-C  predniSONE (DELTASONE) 50 MG tablet Take 1 tablet (50 mg total) by mouth daily for 4 days. 11/02/17 11/06/17  Belinda Fisher, PA-C    Family History Family History  Problem Relation Age of Onset  . Diabetes Mother        Type II  . Heart disease Mother        stent surgery  . Hypertension Mother   . Hyperlipidemia Mother   . Diabetes Father        Type II  . Hypertension Father   . Hyperlipidemia Father     Social History Social History   Tobacco Use  . Smoking status: Former Smoker  Substance Use Topics  . Alcohol use: No  . Drug use: No     Allergies   Sulfa antibiotics   Review of Systems Review of Systems  Reason unable to perform ROS: See HPI as above.     Physical Exam Triage Vital Signs ED Triage Vitals  Enc Vitals Group     BP 11/02/17 1828 (!) 149/79     Pulse Rate 11/02/17 1828 97     Resp 11/02/17 1828 18     Temp 11/02/17 1828 99 F (37.2 C)     Temp src --      SpO2 11/02/17 1828 100 %     Weight --      Height --      Head Circumference --      Peak Flow --      Pain Score 11/02/17 1829 4     Pain Loc --      Pain Edu? --      Excl. in GC? --    No data found.  Updated Vital Signs BP (!) 149/79   Pulse 97   Temp 99 F (37.2 C)   Resp 18   SpO2 100%   Physical Exam  Constitutional: She is oriented to person, place, and time. She appears well-developed and well-nourished. No distress.  HENT:  Head: Normocephalic and atraumatic.  Right Ear: Tympanic membrane, external ear and ear canal normal. Tympanic membrane is not erythematous and not bulging.  Left Ear: Tympanic membrane, external ear and ear canal normal. Tympanic membrane is not erythematous and not bulging.  Nose: Mucosal edema and rhinorrhea present. Right sinus exhibits maxillary sinus tenderness and frontal sinus tenderness.  Left sinus exhibits maxillary sinus tenderness and frontal sinus tenderness.  Mouth/Throat: Uvula is midline, oropharynx is clear and moist and mucous membranes are normal. No tonsillar exudate.  Eyes: Conjunctivae are normal. Pupils are equal, round, and reactive to light.  Neck: Normal range of motion. Neck supple.  Cardiovascular: Normal rate, regular rhythm and normal heart sounds. Exam reveals no gallop and no friction  rub.  No murmur heard. Pulmonary/Chest: No accessory muscle usage. No respiratory distress.  Patient coughing throughout exam. Unable to take deep breath without coughing. No obvious adventitious lung sounds.   Lymphadenopathy:    She has no cervical adenopathy.  Neurological: She is alert and oriented to person, place, and time.  Skin: Skin is warm and dry.  Psychiatric: She has a normal mood and affect. Her behavior is normal. Judgment normal.    UC Treatments / Results  Labs (all labs ordered are listed, but only abnormal results are displayed) Labs Reviewed - No data to display  EKG  EKG Interpretation None       Radiology Dg Chest 2 View  Result Date: 11/02/2017 CLINICAL DATA:  42 y/o F; treatment of pneumonia with recurrent productive cough and shortness of breath. EXAM: CHEST - 2 VIEW COMPARISON:  07/21/2016 CT and radiograph of chest FINDINGS: Mild cardiomegaly. No consolidation, effusion, or pneumothorax. Bones are unremarkable. Upper abdominal surgical clips, presumably cholecystectomy. IMPRESSION: Mild cardiomegaly.  No acute pulmonary process identified. Electronically Signed   By: Mitzi Hansen M.D.   On: 11/02/2017 19:54    Procedures Procedures (including critical care time)  Medications Ordered in UC Medications  AEROCHAMBER PLUS FLO-VU MEDIUM MISC 1 each (1 each Other Provided for home use 11/02/17 2017)     Initial Impression / Assessment and Plan / UC Course  I have reviewed the triage vital signs and the nursing  notes.  Pertinent labs & imaging results that were available during my care of the patient were reviewed by me and considered in my medical decision making (see chart for details).    Chest x-ray negative for pneumonia.  Will have patient take prednisone to help with symptoms.  Tussionex for cough.  Patient  recently started on inhaler due to illness, will provide spacer to help ensure taking full dose of medication.  Push fluids.  Follow-up with PCP for reevaluation needed.  Return precautions given.  Patient expresses understanding and agrees to plan.  Final Clinical Impressions(s) / UC Diagnoses   Final diagnoses:  Cough    ED Discharge Orders        Ordered    predniSONE (DELTASONE) 50 MG tablet  Daily     11/02/17 2012    chlorpheniramine-HYDROcodone (TUSSIONEX PENNKINETIC ER) 10-8 MG/5ML SUER  At bedtime PRN     11/02/17 2012    ipratropium (ATROVENT) 0.06 % nasal spray  4 times daily     11/02/17 2012       Lurline Idol 11/02/17 2118

## 2017-11-02 NOTE — Discharge Instructions (Addendum)
X-ray negative for pneumonia.  Cough could be residual effect from your recent illness.  Prednisone as directed.  Tussionex at night for cough.  You can add Atrovent nasal spray to your regimen to help with nasal congestion and drainage.  Albuterol with spacer as needed for shortness of breath and wheezing. Follow-up with your PCP for reevaluation if symptoms not improving.  Monitor for any worsening of symptoms, chest pain, shortness of breath, wheezing, swelling of the throat, follow up for reevaluation.

## 2017-11-02 NOTE — ED Triage Notes (Signed)
Pt states she say her PCP and was dx with pneumonia, given z pack, followed upw ith pcp and shes still having a cough even with all the medications shes been given by doctor.

## 2017-12-06 NOTE — Patient Instructions (Addendum)
Your procedure is scheduled on: Wednesday Dec 19, 2017 at 7:30 am  Enter through the Main Entrance of [facility] at: 6:00 am  Pick up the phone at the desk and dial [phone].  Call this number if you have problems the morning of surgery: [phone].  Remember: Do NOT eat food or drink any liquids after: Midnight on Tuesday April 30  Take these medicines the morning of surgery with a SIP OF WATER: Restasis  Eye drops. Take flexaril, Sodium chloride spray in needed  Bring albuterol inhaler with you day of surgery  STOP ALL VITAMINS AND SUPPLEMENTS 1 WEEK PRIOR TO SURGERY  DO NOT SMOKE DAY OF SURGERY  Do NOT wear jewelry (body piercing), metal hair clips/bobby pins, make-up, or nail polish. Do NOT wear lotions, powders, or perfumes.  You may wear deoderant. Do NOT shave for 48 hours prior to surgery. Do NOT bring valuables to the hospital. Contacts, dentures, or bridgework may not be worn into surgery.  Have a responsible adult drive you home and stay with you for 24 hours after your procedureye

## 2017-12-10 ENCOUNTER — Other Ambulatory Visit: Payer: Self-pay

## 2017-12-10 ENCOUNTER — Encounter (HOSPITAL_COMMUNITY): Payer: Self-pay | Admitting: Anesthesiology

## 2017-12-10 ENCOUNTER — Encounter (HOSPITAL_COMMUNITY): Payer: Self-pay

## 2017-12-10 ENCOUNTER — Encounter (HOSPITAL_COMMUNITY)
Admission: RE | Admit: 2017-12-10 | Discharge: 2017-12-10 | Disposition: A | Discharge status: Home / Self Care | Payer: Medicaid Other | Source: Ambulatory Visit | Attending: Obstetrics and Gynecology | Admitting: Obstetrics and Gynecology

## 2017-12-10 DIAGNOSIS — Z01812 Encounter for preprocedural laboratory examination: Secondary | ICD-10-CM | POA: Insufficient documentation

## 2017-12-10 DIAGNOSIS — Z01818 Encounter for other preprocedural examination: Secondary | ICD-10-CM | POA: Insufficient documentation

## 2017-12-10 DIAGNOSIS — R9431 Abnormal electrocardiogram [ECG] [EKG]: Secondary | ICD-10-CM | POA: Insufficient documentation

## 2017-12-10 DIAGNOSIS — I447 Left bundle-branch block, unspecified: Secondary | ICD-10-CM | POA: Insufficient documentation

## 2017-12-10 HISTORY — DX: Essential (primary) hypertension: I10

## 2017-12-10 HISTORY — DX: Pneumonia, unspecified organism: J18.9

## 2017-12-10 HISTORY — DX: Enthesopathy, unspecified: M77.9

## 2017-12-10 HISTORY — DX: Chronic kidney disease, unspecified: N18.9

## 2017-12-10 HISTORY — DX: Personal history of urinary calculi: Z87.442

## 2017-12-10 HISTORY — DX: Headache, unspecified: R51.9

## 2017-12-10 HISTORY — DX: Headache: R51

## 2017-12-10 HISTORY — DX: Peripheral vascular disease, unspecified: I73.9

## 2017-12-10 HISTORY — DX: Gastro-esophageal reflux disease without esophagitis: K21.9

## 2017-12-10 LAB — CBC
HEMATOCRIT: 44.1 % (ref 36.0–46.0)
Hemoglobin: 14.4 g/dL (ref 12.0–15.0)
MCH: 29.8 pg (ref 26.0–34.0)
MCHC: 32.7 g/dL (ref 30.0–36.0)
MCV: 91.3 fL (ref 78.0–100.0)
PLATELETS: 352 10*3/uL (ref 150–400)
RBC: 4.83 MIL/uL (ref 3.87–5.11)
RDW: 13.8 % (ref 11.5–15.5)
WBC: 6.1 10*3/uL (ref 4.0–10.5)

## 2017-12-10 LAB — BASIC METABOLIC PANEL
ANION GAP: 10 (ref 5–15)
BUN: 13 mg/dL (ref 6–20)
CALCIUM: 9.2 mg/dL (ref 8.9–10.3)
CO2: 23 mmol/L (ref 22–32)
CREATININE: 1.05 mg/dL — AB (ref 0.44–1.00)
Chloride: 105 mmol/L (ref 101–111)
Glucose, Bld: 88 mg/dL (ref 65–99)
Potassium: 3.9 mmol/L (ref 3.5–5.1)
SODIUM: 138 mmol/L (ref 135–145)

## 2017-12-10 NOTE — Pre-Procedure Instructions (Signed)
Dr. Malen Gauze viewed EKG and aware of history. He called Dr. Jackelyn Knife and discussed EKG. Dr. Jackelyn Knife will get her an appointment to see cardiologist prior to surgery for clearance

## 2017-12-17 DIAGNOSIS — I5020 Unspecified systolic (congestive) heart failure: Secondary | ICD-10-CM

## 2017-12-17 DIAGNOSIS — R9439 Abnormal result of other cardiovascular function study: Secondary | ICD-10-CM

## 2017-12-17 NOTE — Progress Notes (Signed)
Labs 12/10/2017: H/H 14/44. MCV 91. Platelets 352 Glucose 88.  BUN/creatinine 13/1.05.  EGFR > 60.  Sodium 138, potassium 3.9

## 2017-12-17 NOTE — H&P (Signed)
Sarah English 01/02/2018 3:45 PM Location: [facility] Patient #: [id number] DOB: [birth year] Single / Language: Sarah English / Race: White Female   History of Present Illness Sarah Gaskins Patwardhan MD; 02-Jan-2018 5:27 PM) Patient words: Last OV 12/12/2017; Acute visit- add on for nuc & echo results.  The patient is a 42 year old female who presents for a Follow-up for Pre-op visit.  Additional reasons for visit:  Follow-up for Follow up test results is described as the following: 42 y/o Caucasian female with Hashimoto thyroiditis, systemic sclerosis, Sjogren's disease, Raynaud's syndrome, morbid obesity, endometriosis, menorrhagia scheduled to [facility] expolarotory laporoscopy and possible adhesioloysis on 12/19/2017. Given her h/o hypertension and LBBB, she is referred to [place] for preoperative cardiac evaluation.  Workup was significant for dilated cardio myopathy, EF 20-25%, an abnormal stress test. As discussed before, patient does have exertional dyspnea with climbing up stairs. 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 Patient is still able to walk up to 2 miles on flat surface without significant shortness of breath.   Problem List/Past Medical Sarah English; Jan 02, 2018 3:50 PM) Cyst of thyroid (E04.1)  Morbid obesity (E66.01)  Hashimoto's thyroiditis (E06.3)  Radiculopathy, cervical region (M54.12)  Systemic sclerosis, unspecified (M34.9)  Sjogren's disease (M35.00)  Hypertension, essential, benign (I10)  GERD (gastroesophageal reflux disease) (K21.9)  Raynaud's Syndrome  Laboratory examination (Z01.89)  Labs 12/10/2017: H/H 14/44. MCV 91. Platelets 352 Glucose 88. BUN/creatinine 13/1.05. EGFR > 60. Sodium 138, potassium 3.9 Preoperative cardiovascular examination (Z01.810)  Endometriosis (N80.9)  Menorrhagia (N92.0)  LBBB (left bundle branch block) (I44.7)  Echo- 12/13/2017 1. Left ventricle cavity is normal in size.  Moderate concentric hypertrophy of the left ventricle. Abnormal septal wall motion due to left bundle branch block. All other walls are severely hypokinetic. Indeterminate diastolic filling pattern. Severely depressed systolic function. Calculated EF 21%. 2. Left atrial cavity is moderate to severely dilated. 3. Mild to moderate mitral regurgitation. 4. Mild tricuspid regurgitation. No evidence of pulmonary hypertension. 5. IVC is dilated with respiratory variation. EKG 12/12/2017: Sinus rhythm 87 bpm. Possible lead reversal. LBBB.  Allergies Sarah English; 01-02-18 3:50 PM) Sulfa Drugs  Nausea.  Family History Sarah Furbish Sarah English; 2018-01-02 3:50 PM) Mother  Living; Has Stents, DM, HTN Father  Living; No known Heart conditions; Has Parkinsons, DM Brother 1  Younger; No known Heart conditions  Social History Sarah English; 01/02/2018 3:50 PM) Current tobacco use  Former smoker. Quit in 2014; Smoked 1/2 - 1 PPD off & on for 8 years Non Drinker/No Alcohol Use  Marital status  Single. Number of Children  1. Living Situation  Lives with parents.  Past Surgical History Sarah English; 01/02/2018 3:50 PM) Cholecystectomy [2002]: Cesarean Delivery [2004]:  Medication History Sarah English; 01/02/2018 3:57 PM) Gabapentin (300MG Capsule, 1 Oral four times daily as needed for pain) Active. Lisinopril-hydroCHLOROthiazide (20-25MG Tablet, 1 Oral daily) Active. traMADol HCl (50MG Tablet, 1 Oral four times daily as needed for pain) Active. Camila (0.35MG Tablet, 1 Oral daily) Active. ProAir HFA (108 (90 Base)MCG/ACT Aerosol Soln, 2 puffs Inhalation as needed) Active. SUMAtriptan Succinate (100MG Tablet, 1 Oral every 6 hours as needed) Active. Cyclobenzaprine HCl (10MG Tablet, 1 Oral as needed for muscle spasms) Active. Silver sulfADIAZINE (1% Cream, External as needed) Active. Saline (Nasal daily) Active. Restasis (0.05% Emulsion, 1 drop each eye Ophthalmic two times daily)  Active. Aleve (220MG Tablet, 2 Oral as needed) Active. Medications Reconciled (verbally)  Diagnostic Studies History Sarah English; Jan 02, 2018 3:50 PM) Colonoscopy [2018]: Diverticulosis Echocardiogram  12/13/2017 1. Left ventricle cavity is normal in size. Moderate concentric hypertrophy of the left ventricle. Abnormal septal wall motion due to left bundle branch block. All other walls are severely hypokinetic. Indeterminate diastolic filling pattern. Severely depressed systolic function. Calculated EF 21%. 2. Left atrial cavity is moderate to severely dilated. 3. Mild to moderate mitral regurgitation. 4. Mild tricuspid regurgitation. No evidence of pulmonary hypertension. 5. IVC is dilated with respiratory variation.    Review of Systems Sarah Leep, MD; 12/17/2017 5:26 PM) General Present- Fatigue. Not Present- Appetite Loss and Weight Gain. Skin Note: EPisdoes of eczematous rash Respiratory Present- Snoring. Not Present- Chronic Cough and Wakes up from Sleep Wheezing or Short of Breath. Cardiovascular Present- Difficulty Breathing Lying Down, Difficulty Breathing On Exertion and Edema. Not Present- Chest Pain, Claudications, Fainting and Palpitations. Gastrointestinal Not Present- Black, Tarry Stool and Difficulty Swallowing. Female Genitourinary Present- Menorrhagia. Musculoskeletal Not Present- Decreased Range of Motion and Muscle Atrophy. Neurological Not Present- Attention Deficit. Psychiatric Not Present- Personality Changes and Suicidal Ideation. Endocrine Not Present- Cold Intolerance and Heat Intolerance. Hematology Not Present- Abnormal Bleeding. All other systems negative  Vitals Sarah English; 12/17/2017 3:58 PM) 12/17/2017 3:55 PM Weight: 289.38 lb Height: 66in Body Surface Area: 2.34 m Body Mass Index: 46.71 kg/m  Pulse: 101 (Regular)  P.OX: 98% (Room air) BP: 133/73 (Sitting, Left Arm, Standard)       Physical Exam Sarah Gaskins  Patwardhan MD; 12/17/2017 5:26 PM) General Mental Status-Alert. General Appearance-Cooperative and Appears stated age. Build & Nutrition-Moderately built and Moderately obese.  Head and Neck Thyroid Gland Characteristics - normal size and consistency and no palpable nodules.  Chest and Lung Exam Chest and lung exam reveals -quiet, even and easy respiratory effort with no use of accessory muscles, non-tender and on auscultation, normal breath sounds, no adventitious sounds.  Cardiovascular Cardiovascular examination reveals -carotid auscultation reveals no bruits, abdominal aorta auscultation reveals no bruits and no prominent pulsation, femoral artery auscultation bilaterally reveals normal pulses, no bruits, no thrills and normal pedal pulses bilaterally. Auscultation Rhythm - Regular. Murmurs & Other Heart Sounds: Murmur - Location - Mitral Area. Timing - Holodiastolic. Grade - II/VI.  Abdomen Palpation/Percussion Normal exam - Non Tender and No hepatosplenomegaly.  Peripheral Vascular Lower Extremity Palpation - Edema - Bilateral - Trace edema. Carotid arteries - Bilateral-No Carotid bruit.  Neurologic Neurologic evaluation reveals -alert and oriented x 3 with no impairment of recent or remote memory. Motor-Grossly intact without any focal deficits.  Musculoskeletal Global Assessment Left Lower Extremity - no deformities, masses or tenderness, no known fractures. Right Lower Extremity - no deformities, masses or tenderness, no known fractures.   Results Sarah Gaskins Patwardhan MD; 12/17/2017 5:26 PM) Procedures  Name Value Date Echocardiography, transthoracic, real-time with image documentation (2D), includes M-mode recording, when performed, complete, with spectral Doppler echocardiography, and with color flow Doppler echocardiography (22025) Comments: Echocardiogram 12/13/2017: 1. Left ventricle cavity is normal in size. Moderate concentric hypertrophy of  the left ventricle. Abnormal septal wall motion due to left bundle branch block. All other walls are severely hypokinetic. Indeterminate diastolic filling pattern. Severely depressed systolic function. Calculated EF 21%. 2. Left atrial cavity is moderate to severely dilated. 3. Mild to moderate mitral regurgitation. 4. Mild tricuspid regurgitation. No evidence of pulmonary hypertension. 5. IVC is dilated with respiratory variation.  Performed: 12/13/2017 1:50 PM Myocardial perfusion imaging, tomographic (SPECT) (including attenuation correction, qualitative or quantitative wall motion, ejection fraction by first pass or gated technique, additional quantification, when performed); multiple studies, Comments: [employer]  stress test 12/17/2017: 1. Lexiscan stress test performed. Exercise capacity was not assessed. No stress symptoms reported. Normal blood pressure. Stress EKG is non diagnostic for ischemia as it is a pharmacologic stress. In addition, it showed electrocardiogram showed sinus tachycardia, IVCD, no stress arrhythmias and nonspecific ST-T changes.  2. The overall quality of the study is good. Left ventricular cavity is noted to be enlarged on the rest and stress studies. Gated SPECT images reveal severe global reduction in myocardial thickening and wall motion. The left ventricular ejection fraction was calculated or visually estimated to be 14%. SPECT images demonstrate large perfusion abnormality of moderate intensity in the basal inferoseptal, basal inferior, mid inferoseptal, mid inferior, apical septal and apical inferior myocardial wall(s) on the stress and rest images with no significant reversibility. While findings most likely suggest dilated nonischemic cardiomyopathy, large area of scar cannot be excluded. Clinical correlation recommended. 3. High risk study.  Performed: 12/17/2017 1:50 PM    Assessment & Plan Sarah Gaskins Patwardhan MD; 12/17/2017 5:25 PM) Heart  failure with reduced ejection fraction, NYHA class III (I50.20) Current Plans Started Entresto 24-26MG, 1 (one) Tablet twice daily, #28, 14 days starting 12/17/2017, No Refill. [Samples Given] Started Entresto 49-51MG. Started Spironolactone 25MG,  Tablet Once daily, #30, 30 days starting 12/17/2017, Ref. x3. METABOLIC PANEL, BASIC (81275) LBBB (left bundle branch block) (I44.7) Story: Echo- 12/13/2017 1. Left ventricle cavity is normal in size. Moderate concentric hypertrophy of the left ventricle. Abnormal septal wall motion due to left bundle branch block. All other walls are severely hypokinetic. Indeterminate diastolic filling pattern. Severely depressed systolic function. Calculated EF 21%. 2. Left atrial cavity is moderate to severely dilated. 3. Mild to moderate mitral regurgitation. 4. Mild tricuspid regurgitation. No evidence of pulmonary hypertension. 5. IVC is dilated with respiratory variation. EKG 12/12/2017: Sinus rhythm 87 bpm. Possible lead reversal. LBBB. Preoperative cardiovascular examination (Z01.810) Endometriosis (N80.9) Menorrhagia (N92.0) Laboratory examination (Z01.89) Story: Labs 12/10/2017: H/H 14/44. MCV 91. Platelets 352 Glucose 88. BUN/creatinine 13/1.05. EGFR > 60. 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  While nonischemic cardio myopathy is most likely etiology, she warrants left and right heart catheterization with coronary angiography to rule out coronary artery disease. This is scheduled for tomorrow 12/18/2017. I wonder if there is any correlation between her multiple autoimmune illnesses and her cardiomyopathy.   Patient took her last dose of lisinopril/HCTZ on 12/16/2017 early-morning. She should start Entresto 24-26 milligrams  twice daily on 12/18/2017 morning, along with spironolactone 12.5 mg once daily. I will plan on starting beta blocker in 2 weeks after achieving adequate afterload reduction, and then uptitrate her guidelines directed medical therapy for heart failure.  With regards to her scheduled surgery on 12/19/2017 for endometriosis and menorrhagia, new diagnosis of cardiomyopathy certainly increases her perioperative cardiac risk. However, she appears relatively euvolemic on clinical exam. Heart catheterization tomorrow will provide more information regarding her hemodynamics. I will discuss with her surgeon Dr. Orpah Greek regarding her perioperative risk. While I do not anticipate remarkable improvement in her EF immediately, I am hopeful that she will imporvove her LV function with guideline directed medical therapy in several months from now. I will discuss with him regarding the timing of the surgery.  I will see her back in 2 weeks.  Cc Tamsen Roers, MD Cc Sarah Fowler, MD  Signed electronically by Sarah Leep, MD (12/17/2017 5:27  PM)

## 2017-12-18 ENCOUNTER — Encounter (HOSPITAL_COMMUNITY): Payer: Self-pay | Admitting: *Deleted

## 2017-12-18 ENCOUNTER — Ambulatory Visit (HOSPITAL_COMMUNITY)
Admission: RE | Disposition: A | Discharge status: Home / Self Care | Payer: Self-pay | Source: Ambulatory Visit | Attending: Cardiology

## 2017-12-18 ENCOUNTER — Ambulatory Visit (HOSPITAL_COMMUNITY)
Admission: RE | Admit: 2017-12-18 | Discharge: 2017-12-18 | Disposition: A | Discharge status: Home / Self Care | Payer: Medicaid Other | Source: Ambulatory Visit | Attending: Cardiology | Admitting: Cardiology

## 2017-12-18 DIAGNOSIS — I428 Other cardiomyopathies: Secondary | ICD-10-CM | POA: Diagnosis present

## 2017-12-18 DIAGNOSIS — I272 Pulmonary hypertension, unspecified: Secondary | ICD-10-CM | POA: Diagnosis not present

## 2017-12-18 DIAGNOSIS — I447 Left bundle-branch block, unspecified: Secondary | ICD-10-CM | POA: Insufficient documentation

## 2017-12-18 DIAGNOSIS — Z6841 Body Mass Index (BMI) 40.0 and over, adult: Secondary | ICD-10-CM | POA: Diagnosis not present

## 2017-12-18 DIAGNOSIS — K219 Gastro-esophageal reflux disease without esophagitis: Secondary | ICD-10-CM | POA: Diagnosis not present

## 2017-12-18 DIAGNOSIS — I420 Dilated cardiomyopathy: Secondary | ICD-10-CM | POA: Diagnosis not present

## 2017-12-18 DIAGNOSIS — N809 Endometriosis, unspecified: Secondary | ICD-10-CM | POA: Insufficient documentation

## 2017-12-18 DIAGNOSIS — E063 Autoimmune thyroiditis: Secondary | ICD-10-CM | POA: Diagnosis not present

## 2017-12-18 DIAGNOSIS — I5020 Unspecified systolic (congestive) heart failure: Secondary | ICD-10-CM

## 2017-12-18 DIAGNOSIS — M5412 Radiculopathy, cervical region: Secondary | ICD-10-CM | POA: Insufficient documentation

## 2017-12-18 DIAGNOSIS — M3500 Sicca syndrome, unspecified: Secondary | ICD-10-CM | POA: Insufficient documentation

## 2017-12-18 DIAGNOSIS — M349 Systemic sclerosis, unspecified: Secondary | ICD-10-CM | POA: Diagnosis not present

## 2017-12-18 DIAGNOSIS — I7300 Raynaud's syndrome without gangrene: Secondary | ICD-10-CM | POA: Diagnosis not present

## 2017-12-18 DIAGNOSIS — I10 Essential (primary) hypertension: Secondary | ICD-10-CM | POA: Diagnosis not present

## 2017-12-18 DIAGNOSIS — N920 Excessive and frequent menstruation with regular cycle: Secondary | ICD-10-CM | POA: Diagnosis not present

## 2017-12-18 DIAGNOSIS — Z87891 Personal history of nicotine dependence: Secondary | ICD-10-CM | POA: Diagnosis not present

## 2017-12-18 DIAGNOSIS — R9439 Abnormal result of other cardiovascular function study: Secondary | ICD-10-CM

## 2017-12-18 HISTORY — PX: RIGHT/LEFT HEART CATH AND CORONARY ANGIOGRAPHY: CATH118266

## 2017-12-18 LAB — POCT I-STAT 3, ART BLOOD GAS (G3+)
Acid-base deficit: 1 mmol/L (ref 0.0–2.0)
Bicarbonate: 24.5 mmol/L (ref 20.0–28.0)
O2 Saturation: 99 %
PCO2 ART: 42.9 mmHg (ref 32.0–48.0)
PH ART: 7.365 (ref 7.350–7.450)
PO2 ART: 163 mmHg — AB (ref 83.0–108.0)
TCO2: 26 mmol/L (ref 22–32)

## 2017-12-18 LAB — POCT I-STAT 3, VENOUS BLOOD GAS (G3P V)
BICARBONATE: 25.9 mmol/L (ref 20.0–28.0)
O2 Saturation: 69 %
PCO2 VEN: 48 mmHg (ref 44.0–60.0)
PO2 VEN: 39 mmHg (ref 32.0–45.0)
TCO2: 27 mmol/L (ref 22–32)
pH, Ven: 7.34 (ref 7.250–7.430)

## 2017-12-18 LAB — PREGNANCY, URINE: PREG TEST UR: NEGATIVE

## 2017-12-18 SURGERY — RIGHT/LEFT HEART CATH AND CORONARY ANGIOGRAPHY
Anesthesia: LOCAL

## 2017-12-18 MED ORDER — SODIUM CHLORIDE 0.9% FLUSH: 3.0000 mL | Freq: Two times a day (BID) | INTRAVENOUS | Status: DC

## 2017-12-18 MED ORDER — ASPIRIN 81 MG PO CHEW: 81.0000 mg | CHEWABLE_TABLET | ORAL | Status: DC

## 2017-12-18 MED ORDER — VERAPAMIL HCL 2.5 MG/ML IV SOLN
INTRAVENOUS | Status: AC
  Filled 2017-12-18: qty 2

## 2017-12-18 MED ORDER — MIDAZOLAM HCL 2 MG/2ML IJ SOLN
INTRAMUSCULAR | Status: DC | PRN
  Administered 2017-12-18: 1 mg via INTRAVENOUS

## 2017-12-18 MED ORDER — HEPARIN SODIUM (PORCINE) 1000 UNIT/ML IJ SOLN
INTRAMUSCULAR | Status: DC | PRN
  Administered 2017-12-18: 6000 [IU] via INTRAVENOUS

## 2017-12-18 MED ORDER — HEPARIN (PORCINE) IN NACL 2-0.9 UNITS/ML
INTRAMUSCULAR | Status: AC | PRN
  Administered 2017-12-18 (×2): 500 mL

## 2017-12-18 MED ORDER — IOPAMIDOL (ISOVUE-370) INJECTION 76%
INTRAVENOUS | Status: DC | PRN
  Administered 2017-12-18: 30 mL via INTRA_ARTERIAL

## 2017-12-18 MED ORDER — ONDANSETRON HCL 4 MG/2ML IJ SOLN: 4.0000 mg | Freq: Four times a day (QID) | INTRAMUSCULAR | Status: DC | PRN

## 2017-12-18 MED ORDER — FENTANYL CITRATE (PF) 100 MCG/2ML IJ SOLN
INTRAMUSCULAR | Status: DC | PRN
  Administered 2017-12-18: 25 ug via INTRAVENOUS

## 2017-12-18 MED ORDER — SODIUM CHLORIDE 0.9% FLUSH: 3.0000 mL | INTRAVENOUS | Status: DC | PRN

## 2017-12-18 MED ORDER — SODIUM CHLORIDE 0.9 % IV SOLN: 250.0000 mL | INTRAVENOUS | Status: DC | PRN

## 2017-12-18 MED ORDER — ATROPINE SULFATE 1 MG/10ML IJ SOSY
PREFILLED_SYRINGE | INTRAMUSCULAR | Status: DC | PRN
Start: 2017-12-18 — End: 2017-12-18
  Administered 2017-12-18: 0.5 mg via INTRAVENOUS

## 2017-12-18 MED ORDER — MIDAZOLAM HCL 2 MG/2ML IJ SOLN
INTRAMUSCULAR | Status: AC
  Filled 2017-12-18: qty 2

## 2017-12-18 MED ORDER — ASPIRIN 81 MG PO CHEW
CHEWABLE_TABLET | ORAL | Status: AC
  Filled 2017-12-18: qty 1

## 2017-12-18 MED ORDER — FENTANYL CITRATE (PF) 100 MCG/2ML IJ SOLN
INTRAMUSCULAR | Status: AC
  Filled 2017-12-18: qty 2

## 2017-12-18 MED ORDER — SODIUM CHLORIDE 0.9 % IV SOLN: INTRAVENOUS | Status: DC

## 2017-12-18 MED ORDER — LIDOCAINE HCL (PF) 1 % IJ SOLN
INTRAMUSCULAR | Status: AC
  Filled 2017-12-18: qty 30

## 2017-12-18 MED ORDER — IOPAMIDOL (ISOVUE-370) INJECTION 76%
INTRAVENOUS | Status: AC
  Filled 2017-12-18: qty 100

## 2017-12-18 MED ORDER — ATROPINE SULFATE 1 MG/10ML IJ SOSY
PREFILLED_SYRINGE | INTRAMUSCULAR | Status: AC
  Filled 2017-12-18: qty 10

## 2017-12-18 MED ORDER — ACETAMINOPHEN 325 MG PO TABS: 650.0000 mg | ORAL_TABLET | ORAL | Status: DC | PRN

## 2017-12-18 MED ORDER — ASPIRIN 81 MG PO CHEW
CHEWABLE_TABLET | ORAL | Status: DC | PRN
  Administered 2017-12-18: 81 mg via ORAL

## 2017-12-18 MED ORDER — LIDOCAINE HCL (PF) 1 % IJ SOLN
INTRAMUSCULAR | Status: DC | PRN
  Administered 2017-12-18 (×2): 2 mL

## 2017-12-18 MED ORDER — VERAPAMIL HCL 2.5 MG/ML IV SOLN
INTRAVENOUS | Status: DC | PRN
  Administered 2017-12-18: 10 mL via INTRA_ARTERIAL

## 2017-12-18 SURGICAL SUPPLY — 13 items
CATH BALLN WEDGE 5F 110CM (CATHETERS) ×2 IMPLANT
CATH INFINITI 5 FR JL3.5 (CATHETERS) ×2 IMPLANT
CATH INFINITI JR4 5F (CATHETERS) ×2 IMPLANT
DEVICE RAD COMP TR BAND LRG (VASCULAR PRODUCTS) ×2 IMPLANT
GLIDESHEATH SLEND A-KIT 6F 22G (SHEATH) ×2 IMPLANT
GUIDEWIRE INQWIRE 1.5J.035X260 (WIRE) ×1 IMPLANT
INQWIRE 1.5J .035X260CM (WIRE) ×2
KIT HEART LEFT (KITS) ×2 IMPLANT
PACK CARDIAC CATHETERIZATION (CUSTOM PROCEDURE TRAY) ×2 IMPLANT
SHEATH GLIDE SLENDER 4/5FR (SHEATH) ×2 IMPLANT
TRANSDUCER W/STOPCOCK (MISCELLANEOUS) ×2 IMPLANT
TUBING CIL FLEX 10 FLL-RA (TUBING) ×2 IMPLANT
WIRE EMERALD 3MM-J .025X260CM (WIRE) ×2 IMPLANT

## 2017-12-18 NOTE — Research (Signed)
CADFEM Informed Consent   Subject Name: Sarah English  Subject met inclusion and exclusion criteria.  The informed consent form, study requirements and expectations were reviewed with the subject and questions and concerns were addressed prior to the signing of the consent form.  The subject verbalized understanding of the trail requirements.  The subject agreed to participate in the CADFEM trial and signed the informed consent.  The informed consent was obtained prior to performance of any protocol-specific procedures for the subject.  A copy of the signed informed consent was given to the subject and a copy was placed in the subject's medical record.  [name] 12/18/2017, 10:45 AM

## 2017-12-18 NOTE — Interval H&P Note (Signed)
History and Physical Interval Note:  12/18/2017 12:39 PM  Sarah English  has presented today for surgery, with the diagnosis of abn nuke, sob  The various methods of treatment have been discussed with the patient and family. After consideration of risks, benefits and other options for treatment, the patient has consented to  Procedure(s): RIGHT/LEFT HEART CATH AND CORONARY ANGIOGRAPHY (N/A) as a surgical intervention .  The patient's history has been reviewed, patient examined, no change in status, stable for surgery.  I have reviewed the patient's chart and labs.  Questions were answered to the patient's satisfaction.    2012 Appropriate Use Criteria for Diagnostic Catheterization Home / Select Test of Interest Indication for RHC Cardiomyopathies Cardiomyopathies (Right and Left Heart Catheterization OR Right Heart Catheterization Alone With/Wit  Cardiomyopathies  (Right and Left Heart Catheterization OR  Right Heart Catheterization Alone With/Without Left Ventriculography and Coronary Angiography)  Link Here: [email or web address] Indication:  1. Known or suspected cardiomyopathy with or without heart failure A (7) Indication: 93; Score 7      Manish J Patwardhan

## 2017-12-18 NOTE — Discharge Instructions (Signed)
Drink plenty of fluids for 48 hours and keep right wrist heart level for 24 hours  Radial Site Care Refer to this sheet in the next few weeks. These instructions provide you with information about caring for yourself after your procedure. Your health care provider may also give you more specific instructions. Your treatment has been planned according to current medical practices, but problems sometimes occur. Call your health care provider if you have any problems or questions after your procedure. What can I expect after the procedure? After your procedure, it is typical to have the following:  Bruising at the radial site that usually fades within 1-2 weeks.  Blood collecting in the tissue (hematoma) that may be painful to the touch. It should usually decrease in size and tenderness within 1-2 weeks.  Follow these instructions at home:  Take medicines only as directed by your health care provider.  You may shower 24-48 hours after the procedure or as directed by your health care provider. Remove the bandage (dressing) and gently wash the site with plain soap and water. Pat the area dry with a clean towel. Do not rub the site, because this may cause bleeding.  Do not take baths, swim, or use a hot tub until your health care provider approves.  Check your insertion site every day for redness, swelling, or drainage.  Do not apply powder or lotion to the site.  Do not flex or bend the affected arm for 24 hours or as directed by your health care provider.  Do not push or pull heavy objects with the affected arm for 24 hours or as directed by your health care provider.  Do not lift over 10 lb (4.5 kg) for 5 days after your procedure or as directed by your health care provider.  Ask your health care provider when it is okay to: ? Return to work or school. ? Resume usual physical activities or sports. ? Resume sexual activity.  Do not drive home if you are discharged the same day as the  procedure. Have someone else drive you.  You may drive 24 hours after the procedure unless otherwise instructed by your health care provider.  Do not operate machinery or power tools for 24 hours after the procedure.  If your procedure was done as an outpatient procedure, which means that you went home the same day as your procedure, a responsible adult should be with you for the first 24 hours after you arrive home.  Keep all follow-up visits as directed by your health care provider. This is important. Contact a health care provider if:  You have a fever.  You have chills.  You have increased bleeding from the radial site. Hold pressure on the site. Get help right away if:  You have unusual pain at the radial site.  You have redness, warmth, or swelling at the radial site.  You have drainage (other than a small amount of blood on the dressing) from the radial site.  The radial site is bleeding, and the bleeding does not stop after 30 minutes of holding steady pressure on the site.  Your arm or hand becomes pale, cool, tingly, or numb. This information is not intended to replace advice given to you by your health care provider. Make sure you discuss any questions you have with your health care provider. Document Released: 09/09/2010 Document Revised: 01/13/2016 Document Reviewed: 02/23/2014 Elsevier Interactive Patient Education  2018 [employer].

## 2017-12-19 ENCOUNTER — Ambulatory Visit (HOSPITAL_COMMUNITY)
Admission: AD | Admit: 2017-12-19 | Payer: Medicaid Other | Source: Ambulatory Visit | Admitting: Obstetrics and Gynecology

## 2017-12-19 ENCOUNTER — Encounter (HOSPITAL_COMMUNITY): Payer: Self-pay | Admitting: Cardiology

## 2017-12-19 ENCOUNTER — Encounter (HOSPITAL_COMMUNITY): Admission: AD | Payer: Self-pay | Source: Ambulatory Visit

## 2017-12-19 SURGERY — LAPAROSCOPY, DIAGNOSTIC
Anesthesia: General

## 2017-12-19 MED FILL — Heparin Sod (Porcine)-NaCl IV Soln 1000 Unit/500ML-0.9%: INTRAVENOUS | Qty: 1000 | Status: AC

## 2017-12-25 ENCOUNTER — Encounter: Payer: Self-pay | Admitting: Neurology

## 2017-12-26 ENCOUNTER — Encounter: Payer: Self-pay | Admitting: Internal Medicine

## 2017-12-26 ENCOUNTER — Ambulatory Visit (INDEPENDENT_AMBULATORY_CARE_PROVIDER_SITE_OTHER): Payer: Medicaid Other | Admitting: Internal Medicine

## 2017-12-26 VITALS — BP 112/72 | HR 101 | Ht 66.0 in | Wt 288.2 lb

## 2017-12-26 DIAGNOSIS — E063 Autoimmune thyroiditis: Secondary | ICD-10-CM

## 2017-12-26 NOTE — Progress Notes (Addendum)
Patient ID: Sarah English, female   DOB: May 05, 1976, 42 y.o.   MRN: [id number]   HPI  Sarah English is a 42 y.o.-year-old female, returning for f/u for mild hypothyroidism 2/2 Hashimoto's thyroiditis.  Last visit almost 3 years ago.  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 She started on Entresto and Spironolactone.  She also had diverticulitis, PNA, the flu  - in last 2 years.  Also dx'ed with HTN 1 year ago.  She will have pelvic surgery for endometriosis after cleared by cardiology. 42 Now on [name].  Reviewed history: Pt. has been dx with thyroiditis based on the aspect of the thyroid at the time of a recent thyroid U/S: heterogenous aspect, c/w thyroiditis.   I reviewed patient's latest TFTs: Lab Results  Component Value Date   TSH 0.21 (L) 03/30/2015   TSH 5.423 (H) 07/30/2014   TSH 0.39 03/26/2014   TSH 0.30 (L) 02/10/2014   TSH 4.230 11/11/2013   FREET4 1.02 03/30/2015   FREET4 0.68 (L) 07/30/2014   FREET4 0.46 (L) 03/26/2014   FREET4 0.71 02/10/2014   FREET4 0.84 11/11/2013   TPO antibodies were elevated: Component     Latest Ref Rng & Units 11/11/2013  Thyroperoxidase Ab SerPl-aCnc     <35.0 IU/mL 74.0 (H)   In the past, she was taking Tri-iodine and Tyrosine (started 1.5 mo prior) but she was not using iodinated salt at home. I advised her to stop the supplements and to start using iodated salt.   As the TSH was slightly high in 07/2014, we started her on levothyroxine, but the subsequent TSH was slightly suppressed.  She was afterwards lost for follow-up.  When she was taking levothyroxine, she was taking it: - in am - fasting - at least 30 min from b'fast - no Ca, Fe, PPIs, MVI - not on Biotin  Pt mentions: - feeling nodules in neck - hoarseness  Denies: - dysphagia - choking - SOB with lying down  No FH of thyroid cancer. No h/o radiation tx to head or  neck.  No seaweed or kelp. + recent contrast studies (cath - 12/18/2017). No herbal supplements. No Biotin use. No recent steroids use.   She has a history of scleroderma, Sjogren's syndrome, fibromyalgia, vitamin D deficiency-all managed by her rheumatologist. She had a MRI, then lumbar puncture performed for R leg weakness with falls >> no MS.  She is a single mom.  At last visit, she was telling me that she is eating relatively clean diet: organic, minimally processed, no artificial sweeteners, no sodas.  ROS: Constitutional: + Fatigue, + weight gain, + feeling excessively cold, + hot flashes Eyes: no blurry vision, no xerophthalmia ENT: + sore throat, + see HPI Cardiovascular: no CP/ + SOB/no palpitations/+ leg swelling Respiratory: + cough/+ SOB/no wheezing Gastrointestinal: no N/no V/no D/no C/+ acid reflux Musculoskeletal: + muscle aches/+ joint aches Skin: + Rash, no hair loss Neurological: no tremors/no numbness/no tingling/no dizziness, + headache  I reviewed pt's medications, allergies, PMH, social hx, family hx, and changes were documented in the history of present illness. Otherwise, unchanged from my initial visit note.  Past Medical History:  Diagnosis Date  . Autoimmune disorder (HCC)   . Chronic kidney disease    kidney infections  . Endometriosis   . Fibromyalgia   . Fibromyalgia   . GERD (gastroesophageal reflux disease)   . Hashimoto thyroiditis, fibrous variant   .  Hashimoto's disease   . Headache    migraines  . History of kidney stones   . Hypertension   . Peripheral vascular disease (HCC)    raynoids syndrome  . Pneumonia    2- 3 months ago  . Scleroderma (HCC)   . Sjogren's syndrome (HCC)   . Tendinitis    lower back  . Thyroiditis    Past Surgical History:  Procedure Laterality Date  . CESAREAN SECTION    . CHOLECYSTECTOMY    . MANDIBLE FRACTURE SURGERY  1994   upper mandibular  . RIGHT/LEFT HEART CATH AND CORONARY ANGIOGRAPHY N/A  12/18/2017   Procedure: RIGHT/LEFT HEART CATH AND CORONARY ANGIOGRAPHY;  Surgeon: Elder Negus, MD;  Location: [facility];  Service: Cardiovascular;  Laterality: N/A;  . WISDOM TOOTH EXTRACTION     Social History   Socioeconomic History  . Marital status: Single    Spouse name: Not on file  . Number of children: Not on file  . Years of education: Not on file  . Highest education level: Not on file  Occupational History  . Not on file  Social Needs  . Financial resource strain: Not on file  . Food insecurity:    Worry: Not on file    Inability: Not on file  . Transportation needs:    Medical: Not on file    Non-medical: Not on file  Tobacco Use  . Smoking status: Former Smoker    Packs/day: 0.50    Years: 8.00    Pack years: 4.00    Types: Cigarettes    Last attempt to quit: 12/10/2012    Years since quitting: 5.0  . Smokeless tobacco: Never Used  . Tobacco comment: quit 5-6 years ago  Substance and Sexual Activity  . Alcohol use: No  . Drug use: No  . Sexual activity: Yes    Birth control/protection: Inserts  Lifestyle  . Physical activity:    Days per week: Not on file    Minutes per session: Not on file  . Stress: Not on file  Relationships  . Social connections:    Talks on phone: Not on file    Gets together: Not on file    Attends religious service: Not on file    Active member of club or organization: Not on file    Attends meetings of clubs or organizations: Not on file    Relationship status: Not on file  . Intimate partner violence:    Fear of current or ex partner: Not on file    Emotionally abused: Not on file    Physically abused: Not on file    Forced sexual activity: Not on file  Other Topics Concern  . Not on file  Social History Narrative  . Not on file   Current Outpatient Medications on File Prior to Visit  Medication Sig Dispense Refill  . albuterol (PROVENTIL HFA;VENTOLIN HFA) 108 (90 Base) MCG/ACT inhaler Inhale into the  lungs every 6 (six) hours as needed for wheezing or shortness of breath.    Marland Kitchen antiseptic oral rinse (BIOTENE) LIQD 1-2 application by Mouth Rinse route every hour as needed for dry mouth.    . chlorpheniramine-HYDROcodone (TUSSIONEX PENNKINETIC ER) 10-8 MG/5ML SUER Take 5 mLs by mouth at bedtime as needed for cough. (Patient not taking: Reported on 12/05/2017) 60 mL 0  . cyclobenzaprine (FLEXERIL) 10 MG tablet Take 10 mg by mouth 3 (three) times daily as needed for muscle spasms.    Marland Kitchen  cycloSPORINE (RESTASIS) 0.05 % ophthalmic emulsion Place 1 drop into both eyes 2 (two) times daily.     Marland Kitchen gabapentin (NEURONTIN) 300 MG capsule Take 300 mg by mouth as needed (pain).     . Homeopathic Products (CAMILIA PO) Take 0.35 mg by mouth daily.    Marland Kitchen ipratropium (ATROVENT) 0.06 % nasal spray Place 2 sprays into both nostrils 4 (four) times daily. (Patient not taking: Reported on 12/05/2017) 15 mL 0  . lisinopril-hydrochlorothiazide (PRINZIDE,ZESTORETIC) 20-25 MG tablet Take 1 tablet by mouth daily.    . sacubitril-valsartan (ENTRESTO) 24-26 MG Take 1 tablet by mouth daily.    . silver sulfADIAZINE (SILVADENE) 1 % cream Apply 1 application topically daily as needed (PAIN).    Marland Kitchen sodium chloride (AYR) 0.65 % nasal spray Place 1 spray into the nose daily.    Marland Kitchen spironolactone (ALDACTONE) 25 MG tablet Take 12.5 mg by mouth daily.    . traMADol (ULTRAM) 50 MG tablet Take 50 mg by mouth 2 (two) times daily as needed for moderate pain.      No current facility-administered medications on file prior to visit.    Allergies  Allergen Reactions  . Sulfa Antibiotics Nausea And Vomiting   Family History  Problem Relation Age of Onset  . Diabetes Mother        Type II  . Heart disease Mother        stent surgery  . Hypertension Mother   . Hyperlipidemia Mother   . Diabetes Father        Type II  . Hypertension Father   . Hyperlipidemia Father    PE: BP 112/72   Pulse (!) 101   Ht 5\' 6"  (1.676 m)   Wt 288 lb  3.2 oz (130.7 kg)   SpO2 97%   BMI 46.52 kg/m  Body mass index is 46.52 kg/m. Wt Readings from Last 3 Encounters:  12/26/17 288 lb 3.2 oz (130.7 kg)  12/18/17 287 lb (130.2 kg)  12/10/17 287 lb 8 oz (130.4 kg)   Constitutional: overweight, in NAD Eyes: PERRLA, EOMI, no exophthalmos ENT: moist mucous membranes, no thyromegaly, no cervical lymphadenopathy Cardiovascular: tachycardia, RR, No MRG Respiratory: CTA B Gastrointestinal: abdomen soft, NT, ND, BS+ Musculoskeletal: no deformities, strength intact in all 4 Skin: moist, warm, no rashes Neurological: no tremor with outstretched hands, DTR normal in all 4  ASSESSMENT: 1. Hashimoto's thyroiditis  PLAN:  1. Patient with history of Hashimoto's thyroiditis, with mildly elevated TSH at last visits, after which we started a low-dose levothyroxine, 25 mcg daily. After we started, a f/u TSH was a little low >> we held the LT4 >> She was then lost for follow-up for almost 3 years and is now still offher levothyroxine.  She returns today to recheck her TFTs and make sure she does not need to restart her LT4.   - she tells me she does feel occasionally a lump in her neck and we discussed that this is possible with Hashimoto's thyroiditis due to the inflammation and increase in blood flow.  We reviewed together the results of her previous thyroid ultrasound from 2014 that only showed a 5 mm thyroid nodule, not worrisome.   - she had a medically eventful 2 year period since last visit (please see HPI), but overall does not feel much different that at last visit - we discussed about rechecking the TFTs and also her TPO Ab's which were elevated in the past, to check the activity of her Hashimoto's thyroiditis -  However, since she just had a cardiac catheterization approximately a week ago, we cannot check her TFTs today because of the high iodine load.  She will return in 4 weeks for these tests. -We discussed about correct intake of levothyroxine if  we need to restart: Every day, with water, more than 30 minutes before breakfast, separated by more than 4 hours from acid reflux medications, calcium, iron, multivitamins. - If labs today are abnormal, she will need to return in 6 weeks for repeat labs - If these are normal, I will see her back in 1 year  - time spent with the patient: 25 min, of which >50% was spent in obtaining information about her medical issues and symptoms since last visit, reviewing her previous labs, evaluations, and treatments, counseling her about her condition, and developing a plan to further investigate and treat it  Component     Latest Ref Rng & Units 01/28/2018  Triiodothyronine,Free,Serum     2.3 - 4.2 pg/mL 3.5  T4,Free(Direct)     0.60 - 1.60 ng/dL 1.61  TSH     0.96 - 0.45 uIU/mL 3.51  Thyroperoxidase Ab SerPl-aCnc     <9 IU/mL 26 (H)   Labs normal except elevated TPO Abs, which are improved, though. No need to restart LT4 for now.  Carlus Pavlov, MD PhD [facility]

## 2017-12-26 NOTE — Patient Instructions (Signed)
Please come back for labs in 1 month.  Please come back for a follow-up appointment in 1 year.

## 2018-01-02 ENCOUNTER — Encounter: Payer: Self-pay | Admitting: Cardiology

## 2018-01-02 ENCOUNTER — Other Ambulatory Visit (HOSPITAL_COMMUNITY): Payer: Self-pay | Admitting: Cardiology

## 2018-01-02 DIAGNOSIS — I420 Dilated cardiomyopathy: Secondary | ICD-10-CM

## 2018-01-09 ENCOUNTER — Ambulatory Visit (HOSPITAL_COMMUNITY)
Admission: RE | Admit: 2018-01-09 | Discharge: 2018-01-09 | Disposition: A | Discharge status: Home / Self Care | Payer: Medicaid Other | Source: Ambulatory Visit | Attending: Cardiology | Admitting: Cardiology

## 2018-01-09 DIAGNOSIS — I420 Dilated cardiomyopathy: Secondary | ICD-10-CM | POA: Diagnosis present

## 2018-01-09 MED ORDER — GADOBENATE DIMEGLUMINE 529 MG/ML IV SOLN
40.0000 mL | Freq: Once | INTRAVENOUS | Status: AC | PRN
  Administered 2018-01-09: 39 mL via INTRAVENOUS

## 2018-01-16 DIAGNOSIS — I429 Cardiomyopathy, unspecified: Secondary | ICD-10-CM | POA: Diagnosis not present

## 2018-01-28 ENCOUNTER — Other Ambulatory Visit (INDEPENDENT_AMBULATORY_CARE_PROVIDER_SITE_OTHER): Payer: Medicaid Other

## 2018-01-28 DIAGNOSIS — E063 Autoimmune thyroiditis: Secondary | ICD-10-CM | POA: Diagnosis not present

## 2018-01-28 LAB — T3, FREE: T3 FREE: 3.5 pg/mL (ref 2.3–4.2)

## 2018-01-28 LAB — TSH: TSH: 3.51 u[IU]/mL (ref 0.35–4.50)

## 2018-01-28 LAB — T4, FREE: FREE T4: 0.73 ng/dL (ref 0.60–1.60)

## 2018-01-29 LAB — THYROID PEROXIDASE ANTIBODY: Thyroperoxidase Ab SerPl-aCnc: 26 IU/mL — ABNORMAL HIGH (ref <9)

## 2018-02-02 ENCOUNTER — Encounter: Payer: Self-pay | Admitting: Internal Medicine

## 2018-03-18 ENCOUNTER — Encounter: Payer: Self-pay | Admitting: Neurology

## 2018-03-20 ENCOUNTER — Encounter: Payer: Self-pay | Admitting: Neurology

## 2018-03-20 ENCOUNTER — Ambulatory Visit: Payer: Medicaid Other | Admitting: Neurology

## 2018-03-20 VITALS — BP 128/83 | HR 65 | Ht 66.0 in | Wt 296.0 lb

## 2018-03-20 DIAGNOSIS — I5023 Acute on chronic systolic (congestive) heart failure: Secondary | ICD-10-CM

## 2018-03-20 DIAGNOSIS — M3501 Sicca syndrome with keratoconjunctivitis: Secondary | ICD-10-CM

## 2018-03-20 DIAGNOSIS — E063 Autoimmune thyroiditis: Secondary | ICD-10-CM | POA: Diagnosis not present

## 2018-03-20 DIAGNOSIS — R0683 Snoring: Secondary | ICD-10-CM

## 2018-03-20 DIAGNOSIS — M3500 Sicca syndrome, unspecified: Secondary | ICD-10-CM

## 2018-03-20 DIAGNOSIS — M349 Systemic sclerosis, unspecified: Secondary | ICD-10-CM

## 2018-03-20 DIAGNOSIS — Z6841 Body Mass Index (BMI) 40.0 and over, adult: Secondary | ICD-10-CM

## 2018-03-20 NOTE — Progress Notes (Signed)
SLEEP MEDICINE CLINIC   Provider:  Melvyn Novas, M D  Primary Care Physician:  Aida Puffer, MD   Referring Provider: Rosemary Holms, MD Cardiology   Chief Complaint  Patient presents with  . New Patient (Initial Visit)    pt alone, rm 11. pt states her cardiologist asked her about sleeping. wakes up frequently or has difficulty with falling asleep. pt states that she tired all the time. snores also.     HPI:  Sarah English is a 42 y.o. female patient, seen here in a referral from Dr. Rosemary Holms for a sleep consultation.   Chief complaint according to patient : ' I don't sleep well at all- I never have . I couldn't sleep well as a teenager. I  was diagnosed with an autoimmune disorder ( Hashimoto's thyroiditis , scleroderma and Sjgren's  Disease)  and I am fatigued - I have pain and deal always with inflammation". She reports it all started with mononucleosis.  [name] also suffers from morbid obesity, nonischemic cardiomyopathy with left bundle branch block, she had 2 cystoscopies as a child under age 40, she had jaw surgery to the upper jaw, in 1999 had an urgent cholecystectomy, in 2004 she had a C-section, she now deals with possible endometriosis as well.  She also carries a diagnosis of hypertension and ophthalmic migraine.  Sleep habits are as follows: 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 By 10:00 she may have to watch TV for an hour and she turns off all electronic devices, denies to light and retreats to her bedroom.   the bedroom would be cool,quiet and dark.  Depending on the pain intensity she may be able to sleep for 5 hours versus only 2 or 3 hours.  Some nights she spends on her sofa depending on her back pain. She sleeps best supine, and with 3 pillows or in a recliner, likes knee support.  She has to take  furosemide-Lasix twice a day to control her cardiomyopathy related heart failure. Nocturia 1-3 times. She snores loud, is not sure about pauses in breathing.She reports on average sleeping 4 hours at night.   Sleep medical history and family sleep history: father has PD, both parents have HTN, DM, Obesity, mother has CAD- nobody has Crohn's , RF, MS, LUPUS  or vitiligo.   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 She is the mother of one son, 69 years old, living on a farm. Non smoker for 6 years, ETOH - very seldomly, " I get sick " caffeine : coffee in AM 1 cup, 1 hot tea at night, decaffeinated. No energy drinks, rare  sodas ( 1 a month ).  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   Review of Systems: Out of a complete 14 system review, the patient complains of only the following symptoms, and all other reviewed systems are negative. She reports insomnia almost lifelong, daytime sleepiness and napping whenever she can find the possibility to gather some sleep, snoring, she does feel that she has more frequent headaches migrainous but also numbness, weakness and vision changes with her migraines.  She tends to bruise easily has joint swelling joint pain and aching muscles.  Epworth score 6, Fatigue severity score 59 out of 63 (!)   , depression score; she denies.    Social History   Socioeconomic History  . Marital status: Single    Spouse name: Not on file  . Number of children: Not on file  . Years of education: Not on file  . Highest education level: Not on file  Occupational History  . Not on file  Social Needs  . Financial resource strain: Not on file  . Food insecurity:    Worry: Not on file    Inability: Not on file  . Transportation needs:    Medical: Not on file    Non-medical: Not on file  Tobacco Use  . Smoking status: Former Smoker    Packs/day: 0.50     Years: 8.00    Pack years: 4.00    Types: Cigarettes    Last attempt to quit: 12/10/2012    Years since quitting: 5.2  . Smokeless tobacco: Never Used  . Tobacco comment: quit 5-6 years ago  Substance and Sexual Activity  . Alcohol use: No  . Drug use: No  . Sexual activity: Yes    Birth control/protection: Inserts  Lifestyle  . Physical activity:    Days per week: Not on file    Minutes per session: Not on file  . Stress: Not on file  Relationships  . Social connections:    Talks on phone: Not on file    Gets together: Not on file    Attends religious service: Not on file    Active member of club or organization: Not on file    Attends meetings of clubs or organizations: Not on file    Relationship status: Not on file  . Intimate partner violence:    Fear of current or ex partner: Not on file    Emotionally abused: Not on file    Physically abused: Not on file    Forced sexual activity: Not on file  Other Topics Concern  . Not on file  Social History Narrative  . Not on file    Family History  Problem Relation Age of Onset  . Diabetes Mother        Type II  . Heart disease Mother        stent surgery  . Hypertension Mother   . Hyperlipidemia Mother   . Diabetes Father        Type II  . Hypertension Father   . Hyperlipidemia Father     Past Medical History:  Diagnosis Date  . Autoimmune disorder (HCC)   . Chronic kidney disease    kidney infections  . Endometriosis   . Fibromyalgia   . Fibromyalgia   . GERD (gastroesophageal reflux disease)   . Hashimoto thyroiditis, fibrous variant   . Hashimoto's disease   . Headache    migraines  . History of kidney stones   . Hypertension   . Peripheral vascular disease (HCC)    raynoids syndrome  . Pneumonia    2- 3 months ago  . Scleroderma (HCC)   . Sjogren's syndrome (HCC)   . Tendinitis    lower back  . Thyroiditis     Past Surgical History:  Procedure Laterality Date  . CESAREAN SECTION    .  CHOLECYSTECTOMY    . MANDIBLE FRACTURE SURGERY  1994   upper mandibular  . RIGHT/LEFT HEART CATH AND CORONARY ANGIOGRAPHY N/A 12/18/2017   Procedure: RIGHT/LEFT HEART CATH AND CORONARY ANGIOGRAPHY;  Surgeon: Elder Negus, MD;  Location: [facility];  Service: Cardiovascular;  Laterality: N/A;  . WISDOM TOOTH EXTRACTION      Current Outpatient Medications  Medication Sig Dispense Refill  . albuterol (PROVENTIL HFA;VENTOLIN HFA) 108 (90 Base) MCG/ACT inhaler Inhale into the lungs every 6 (six) hours as needed for wheezing or shortness of breath.    Marland Kitchen antiseptic oral rinse (BIOTENE) LIQD 1-2 application by Mouth Rinse route every hour as needed for dry mouth.    Marland Kitchen CAMILA 0.35 MG tablet Take 1 tablet by mouth daily.  1  . cephALEXin (KEFLEX) 500 MG capsule Take 500 mg by mouth 3 (three) times daily.    Retia Passe 5 MG TABS tablet Take 5 mg by mouth 2 (two) times daily.  2  . cyclobenzaprine (FLEXERIL) 10 MG tablet Take 10 mg by mouth 3 (three) times daily as needed for muscle spasms.    . cycloSPORINE (RESTASIS) 0.05 % ophthalmic emulsion Place 1 drop into both eyes 2 (two) times daily.     . furosemide (LASIX) 20 MG tablet furosemide 20 mg tablet  TAKE 1 TABLET BY MOUTH EVERY DAY    . Lactobacillus Rhamnosus, GG, (CULTURELLE PROBIOTICS KIDS PO) Take 1 tablet by mouth daily.    . metoprolol succinate (TOPROL-XL) 25 MG 24 hr tablet Take 25 mg by mouth daily.  2  . omeprazole (PRILOSEC) 20 MG capsule Take 20 mg by mouth daily.    . sacubitril-valsartan (ENTRESTO) 24-26 MG Take 1 tablet by mouth 2 (two) times daily.     Marland Kitchen selenium (SELENIMIN-200) 200 MCG TABS tablet Take by mouth daily.    . silver sulfADIAZINE (SILVADENE) 1 % cream Apply 1 application topically daily as needed (PAIN).    Marland Kitchen sodium chloride (AYR) 0.65 % nasal spray Place 1 spray into the nose daily.    Marland Kitchen spironolactone (ALDACTONE) 25 MG tablet Take 12.5 mg by mouth daily.    . SUMAtriptan (IMITREX) 100 MG tablet Take  100 mg by mouth every 6 (six) hours as needed for migraine. May repeat in 2 hours if headache persists or recurs.    . traMADol (ULTRAM) 50 MG tablet Take 50 mg by mouth 2 (two) times daily as needed for moderate pain.     Marland Kitchen gabapentin (NEURONTIN) 300 MG capsule Take 300 mg by mouth as needed (pain).      No current facility-administered medications for this visit.     Allergies as of 03/20/2018 - Review Complete 03/20/2018  Allergen Reaction Noted  . Sulfa antibiotics Nausea And Vomiting 11/11/2013    Vitals: BP 128/83   Pulse 65   Ht 5\' 6"  (1.676 m)   Wt 296 lb (134.3 kg)   BMI 47.78 kg/m  Last Weight:  Wt Readings from Last 1 Encounters:  03/20/18 296 lb (134.3 kg)   ZOX:WRUE mass index is 47.78 kg/m.     Last Height:   Ht Readings from Last 1 Encounters:  03/20/18 5\' 6"  (1.676 m)    Physical exam:  General: The patient is awake, alert and appears not in acute distress. The patient is well groomed. Head: Normocephalic, atraumatic. Neck is supple. Mallampati 5,  neck circumference:17. 25 " . Nasal airflow patent , prognathia is seen. She clenches her teeth.  Cardiovascular:  Regular rate and rhythm , without  murmurs or carotid bruit, and without distended neck veins. Respiratory: Lungs are clear to auscultation. Skin:  With ankle edema,not rash, pasty looking skin.  Trunk: BMI  is 48. The patient's posture is erect.   Neurologic exam : The patient is awake and alert, oriented to place and time.   Attention span & concentration ability appears normal.  Speech is fluent,  without dysarthria, dysphonia or aphasia.  Mood and affect are appropriate.  Cranial nerves: Pupils are equal and briskly reactive to light. Funduscopic exam without evidence of pallor or edema.  Extraocular movements  in vertical and horizontal planes intact and without nystagmus. Visual fields by finger perimetry are intact. Hearing to finger rub intact. Facial sensation intact to fine touch.Facial  motor strength is symmetric, tongue and uvula move in midline. Shoulder shrug was symmetrical.  Motor exam:  Normal tone, muscle bulk and symmetric strength in all extremities. There is limited ROM over her left shoulder and  Mild rigidity over the left biceps. Equal grip strength.  Sensory:  Fine touch, pinprick and vibration were tested in all extremities. Proprioception tested in the upper extremities was normal. Coordination: Finger-to-nose maneuver  normal without evidence of ataxia, dysmetria or tremor. Gait and station: Patient walks without assistive device , wider based . Strength within normal limits.  Stance is stable and normal.   Deep tendon reflexes: in the upper and lower extremities are symmetric and intact.    I reviewed medication, VS and last cardiological notes.  Cardiac MRI was dated 16 Jan 2018, The test was interpreted by Dr. Traci Sermon and showed severe left ventricular enlargement with global dysfunction EF 60%, it showed a small area of subepicardial gadolinium uptake in the inferior wall consistent with a nonischemic cardiomyopathy dilated cardiomyopathy.  Moderate LAE, mild  mitral valve regurgitation, normal right ventricular size.   ECHO - Nuclear stress test done on 01-2018.   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  Recommendation: Continue guideline directed medical therapy for HFrEF  Manish Emiliano Dyer, MD [facility]     Assessment:  After physical and neurologic examination, review of laboratory studies,  Personal review of imaging studies, reports of other /same  Imaging studies, results of polysomnography and / or neurophysiology testing and pre-existing records as far as provided in visit., my assessment is   1)  Severe Cardiomyopathy attributed to  autoimmune disease - as multiple autoimmune diseases, reports onset of symptoms after mononucleosis infection.  SOB and high fatigue , prone to central apnea, hypoxemia and possibly hypercapnia.   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    The patient was advised of the nature of the diagnosed disorder , the treatment options and the  risks for general health and wellness arising from not treating the condition.   I spent more than 60 minutes of face to face time with the patient.  Greater than 50% of time was spent in counseling and coordination of care. We have discussed the diagnosis and differential and I answered the patient's questions.    Plan:  Treatment plan and additional workup :  this patient requires an attended sleep study with SPLIT , capnography,  She may use a sleep aid to help  a sleep study have a higher yield.  Sleep aids cause weird dreams.  I recommended to try melatonin, 2-6 mg at bedtime.         RV with me -   Melvyn Novas, MD 03/20/2018, 12:19 PM  Certified in Neurology by ABPN Certified in [place] by [facility] [address] [place],  [address]

## 2018-04-01 ENCOUNTER — Ambulatory Visit (INDEPENDENT_AMBULATORY_CARE_PROVIDER_SITE_OTHER): Payer: Medicaid Other | Admitting: Neurology

## 2018-04-01 DIAGNOSIS — M3501 Sicca syndrome with keratoconjunctivitis: Secondary | ICD-10-CM

## 2018-04-01 DIAGNOSIS — G4733 Obstructive sleep apnea (adult) (pediatric): Secondary | ICD-10-CM

## 2018-04-01 DIAGNOSIS — E063 Autoimmune thyroiditis: Secondary | ICD-10-CM

## 2018-04-01 DIAGNOSIS — Z6841 Body Mass Index (BMI) 40.0 and over, adult: Secondary | ICD-10-CM

## 2018-04-01 DIAGNOSIS — R0683 Snoring: Secondary | ICD-10-CM

## 2018-04-01 DIAGNOSIS — M349 Systemic sclerosis, unspecified: Secondary | ICD-10-CM

## 2018-04-01 DIAGNOSIS — M3500 Sicca syndrome, unspecified: Secondary | ICD-10-CM

## 2018-04-01 DIAGNOSIS — I447 Left bundle-branch block, unspecified: Secondary | ICD-10-CM

## 2018-04-01 DIAGNOSIS — I5023 Acute on chronic systolic (congestive) heart failure: Secondary | ICD-10-CM

## 2018-04-02 DIAGNOSIS — Z6841 Body Mass Index (BMI) 40.0 and over, adult: Secondary | ICD-10-CM

## 2018-04-02 DIAGNOSIS — M3501 Sicca syndrome with keratoconjunctivitis: Secondary | ICD-10-CM | POA: Insufficient documentation

## 2018-04-02 DIAGNOSIS — I5023 Acute on chronic systolic (congestive) heart failure: Secondary | ICD-10-CM | POA: Insufficient documentation

## 2018-04-02 DIAGNOSIS — M3500 Sicca syndrome, unspecified: Secondary | ICD-10-CM | POA: Insufficient documentation

## 2018-04-02 DIAGNOSIS — R0683 Snoring: Secondary | ICD-10-CM | POA: Insufficient documentation

## 2018-04-02 DIAGNOSIS — M349 Systemic sclerosis, unspecified: Secondary | ICD-10-CM | POA: Insufficient documentation

## 2018-04-02 NOTE — Procedures (Signed)
PATIENT'S NAME:  Sarah English, Sarah English DOB:      [birth year]      MR#:    [id number]     DATE OF RECORDING: 04/01/2018 REFERRING M.D.:  Truett Mainland, MD Study Performed:   Baseline Polysomnogram HISTORY:   Sarah English is a 42 y.o. female patient, seen here in a referral from Dr. Rosemary Holms for a sleep consultation. Chief complaint according to patient: ' I don't sleep well at all- I never have. I couldn't sleep well as a teenager. I was diagnosed with autoimmune disorders, namely Hashimoto's thyroiditis, Scleroderma, and Sjgren's Disease and I am fatigued - I have pain and deal always with inflammation". She reports it all started with a mononucleosis infection.  [name] also suffers from morbid obesity, has a non-ischemic cardiomyopathy with left bundle branch block, She also carries a diagnosis of hypertension and ophthalmic migraine. Depending on pain intensity she may be able to sleep for 5 hours versus only 2 or 3 hours.  Some nights she spends on her sofa depending on her back pain. She sleeps best supine, and with 3 pillows or in a recliner, likes knee support. She has to take furosemide-Lasix twice a day to control her cardiomyopathy related heart failure, thus having Nocturia 1-3 times. She snores loud, is not sure about pauses in breathing. She reports on average sleeping 4 hours at night.  The patient endorsed the Epworth Sleepiness Scale at 6/24 points, FSS at 59/63.   The patient's weight 296 pounds with a height of 66 (inches), resulting in a BMI of 47.5 kg/m2. The patient's neck circumference measured 17.2 inches.  CURRENT MEDICATIONS: Albuterol inhaler, Biotene, Camila, Keflex, Corlanor, Flexeril, Restasis, Lasix, Probiotics, Toprol XL, Prilosec, Entresto, Selenium, saline nasal spray, Aldactone, Imitrex, Ultram, Neurontin.   PROCEDURE:  This is a multichannel digital polysomnogram utilizing the Somnostar 11.2 system.  Electrodes and sensors were applied and monitored per AASM  Specifications.   EEG, EOG, Chin and Limb EMG, were sampled at 200 Hz.  ECG, Snore and Nasal Pressure, Thermal Airflow, Respiratory Effort, CPAP Flow and Pressure, Oximetry was sampled at 50 Hz. Digital video and audio were recorded.      BASELINE STUDY: Lights Out was at 22:09 and Lights On at 05:00.  Total recording time (TRT) was 411.5 minutes, with a total sleep time (TST) of 345 minutes.   The patient's sleep latency was 31 minutes.  REM latency was 0 minutes.  The sleep efficiency was 83.8 %. The sleep architecture was severely fragmented.     SLEEP ARCHITECTURE: WASO (Wake after sleep onset) was 43.5 minutes.  There were 13 minutes in Stage N1, 266 minutes Stage N2, 66 minutes Stage N3 and 0 minutes in Stage REM.  The percentage of Stage N1 was 3.8%, Stage N2 was 77.1%, Stage N3 was 19.1% and Stage R (REM sleep) was 0%.   RESPIRATORY ANALYSIS:  There were a total of 87 respiratory events:  16 obstructive apneas, 0 central apneas and 0 mixed apneas with 71 hypopneas. The patient also had 0 respiratory event related arousals (RERAs). The total APNEA/HYPOPNEA INDEX (AHI) was 15.1/hour and the total RESPIRATORY DISTURBANCE INDEX was 15.1 /hour.  0 events occurred in REM sleep and 142 events in NREM. The REM AHI was 0.0 /hour, versus a non-REM AHI of 15.1. The patient spent 176 minutes of total sleep time in the supine position and 169 minutes in non-supine. The supine AHI was 24.3/h, versus a non-supine AHI of 5.7.  OXYGEN SATURATION & C02:  The Wake baseline 02 saturation was 95%, with the lowest being 79%. Time spent below 89% saturation equaled 6 minutes. Average End Tidal CO2 during sleep was 36 torr.  Peak End Tidal CO2 during NREM sleep was 47 torr.  Total sleep time greater than 50 torr was 0.0 minutes.   PERIODIC LIMB MOVEMENTS:   The patient had a total of 0 Periodic Limb Movements The arousals were noted as: 58 were spontaneous, 0 were associated with PLMs, and only 18 were associated with  respiratory events.  Audio and video analysis did not show any abnormal or unusual movements, behaviors, phonations or vocalizations.  No nocturia. Mild Snoring noted. Abnormal EKG with BBB, and isolated irregular beats.  Post-study, the patient indicated that sleep was interrupted by pain, the same as usual.   IMPRESSION:  1. Obstructive Sleep Apnea (OSA) at AHI of 15.1/h, supine AHI at 24.3/h. 2. No Periodic Limb Movement Disorder (PLMD) 3. Mild Primary Snoring 4. Abnormal EKG with LBBB and related to cardiomyopathy.  5. Frequent, sudden spontaneous arousals- without any appreciable change in physiological markers.     RECOMMENDATIONS:  1. Advise full-night, attended, CPAP titration study to optimize therapy.    I certify that I have reviewed the entire raw data recording prior to the issuance of this report in accordance with the Standards of Accreditation of the American Academy of Sleep Medicine (AASM)   Melvyn Novas, MD    04-02-2018  Diplomat, American Board of Psychiatry and Neurology  Diplomat, American Board of Sleep Medicine Medical Director, [facility] [employer]

## 2018-04-02 NOTE — Procedures (Signed)
Sarah English is a 42 y.o. female patient. 1. Acute on chronic systolic heart failure (HCC)   2. Hashimoto's thyroiditis   3. Morbid obesity with body mass index (BMI) of 45.0 to 49.9 in adult (HCC)   4. Sjogren's syndrome with keratoconjunctivitis sicca (HCC)   5. Sicca complex (HCC)   6. Scleroderma (HCC)   7. Snoring   8. CHF (congestive heart failure), NYHA class III, acute on chronic, systolic (HCC)    Past Medical History:  Diagnosis Date  . Autoimmune disorder (HCC)   . Chronic kidney disease    kidney infections  . Endometriosis   . Fibromyalgia   . Fibromyalgia   . GERD (gastroesophageal reflux disease)   . Hashimoto thyroiditis, fibrous variant   . Hashimoto's disease   . Headache    migraines  . History of kidney stones   . Hypertension   . Peripheral vascular disease (HCC)    raynoids syndrome  . Pneumonia    2- 3 months ago  . Scleroderma (HCC)   . Sjogren's syndrome (HCC)   . Tendinitis    lower back  . Thyroiditis    There were no vitals taken for this visit.  Procedures  Linton Ham 04/02/2018

## 2018-04-02 NOTE — Addendum Note (Signed)
Addended by: Melvyn Novas on: 04/02/2018 05:52 PM   Modules accepted: Orders

## 2018-04-03 ENCOUNTER — Telehealth: Payer: Self-pay | Admitting: Neurology

## 2018-04-03 NOTE — Telephone Encounter (Signed)
-----   Message from Melvyn Novas, MD sent at 04/02/2018  5:52 PM EDT ----- IMPRESSION:  1. Obstructive Sleep Apnea (OSA) at AHI of 15.1/h, supine AHI at  24.3/h. 2. No Periodic Limb Movement Disorder (PLMD) 3. Mild Primary Snoring 4. Abnormal EKG with LBBB and related to cardiomyopathy.  5. Frequent, sudden spontaneous arousals- without any appreciable  change in physiological markers.    RECOMMENDATIONS:  1. Advise full-night, attended, CPAP titration study to optimize  therapy.   I certify that I have reviewed the entire raw data recording  prior to the issuance of this report in accordance with the  Standards of Accreditation of the American Academy of Sleep  Medicine (AASM)   Melvyn Novas, MD  04-02-2018  Cc Aida Puffer.

## 2018-04-03 NOTE — Telephone Encounter (Signed)
I called pt. I advised pt that Dr. Dohmeier  reviewed their sleep study results and found that mild to moderate sleep apnea and recommends that pt be treated with a cpap. Dr. Dohmeier recommends that pt return for a repeat sleep study in order to properly titrate the cpap and ensure a good mask fit. Pt is agreeable to returning for a titration study. I advised pt that our sleep lab will file with pt's insurance and call pt to schedule the sleep study when we hear back from the pt's insurance regarding coverage of this sleep study. Pt verbalized understanding of results. Pt had no questions at this time but was encouraged to call back if questions arise.  

## 2018-04-10 ENCOUNTER — Encounter (HOSPITAL_BASED_OUTPATIENT_CLINIC_OR_DEPARTMENT_OTHER): Payer: Self-pay | Admitting: *Deleted

## 2018-04-10 NOTE — Progress Notes (Addendum)
Requested lov note, stress test, echo, and ekg from pt cardiologist, dr Rosemary Holms, at [facility] cardiology.  ADDENDUM:  Received information from cardiologist via fax.  Reviewed stress test (ef 14%) dated 12-17-2017 and  Echo dated 12-13-2017 ef 21% and cardiac cath results in epic dated 12-18-2017.  Also received clearance stating pt is intermediate cardiac risk for surgery. Pt is not a ambulatory surgery candidate due to ef below 40%, per guidelines.  Called and spoke w/ Dahlia Client, dr meisinger or scheduler, informed her of this and to let dr Jackelyn Knife pt unable to be done at surgery center .

## 2018-04-11 ENCOUNTER — Inpatient Hospital Stay (HOSPITAL_COMMUNITY): Admission: RE | Admit: 2018-04-11 | Payer: Medicaid Other | Source: Ambulatory Visit

## 2018-04-12 ENCOUNTER — Encounter (HOSPITAL_COMMUNITY): Payer: Self-pay

## 2018-04-12 ENCOUNTER — Ambulatory Visit (INDEPENDENT_AMBULATORY_CARE_PROVIDER_SITE_OTHER): Payer: Medicaid Other | Admitting: Neurology

## 2018-04-12 ENCOUNTER — Other Ambulatory Visit: Payer: Self-pay

## 2018-04-12 ENCOUNTER — Telehealth (HOSPITAL_COMMUNITY): Payer: Self-pay

## 2018-04-12 ENCOUNTER — Encounter (HOSPITAL_COMMUNITY)
Admission: RE | Admit: 2018-04-12 | Discharge: 2018-04-12 | Disposition: A | Discharge status: Home / Self Care | Payer: Medicaid Other | Source: Ambulatory Visit | Attending: Obstetrics and Gynecology | Admitting: Obstetrics and Gynecology

## 2018-04-12 DIAGNOSIS — I447 Left bundle-branch block, unspecified: Secondary | ICD-10-CM

## 2018-04-12 DIAGNOSIS — I5023 Acute on chronic systolic (congestive) heart failure: Secondary | ICD-10-CM

## 2018-04-12 DIAGNOSIS — Z01812 Encounter for preprocedural laboratory examination: Secondary | ICD-10-CM | POA: Insufficient documentation

## 2018-04-12 DIAGNOSIS — G4733 Obstructive sleep apnea (adult) (pediatric): Secondary | ICD-10-CM | POA: Diagnosis not present

## 2018-04-12 DIAGNOSIS — Z6841 Body Mass Index (BMI) 40.0 and over, adult: Secondary | ICD-10-CM | POA: Diagnosis not present

## 2018-04-12 DIAGNOSIS — M3500 Sicca syndrome, unspecified: Secondary | ICD-10-CM

## 2018-04-12 DIAGNOSIS — R0683 Snoring: Secondary | ICD-10-CM

## 2018-04-12 HISTORY — DX: Bronchitis, not specified as acute or chronic: J40

## 2018-04-12 HISTORY — DX: Left bundle-branch block, unspecified: I44.7

## 2018-04-12 HISTORY — DX: Anemia, unspecified: D64.9

## 2018-04-12 LAB — COMPREHENSIVE METABOLIC PANEL
ALBUMIN: 4.3 g/dL (ref 3.5–5.0)
ALT: 45 U/L — ABNORMAL HIGH (ref 0–44)
ANION GAP: 10 (ref 5–15)
AST: 33 U/L (ref 15–41)
Alkaline Phosphatase: 54 U/L (ref 38–126)
BUN: 13 mg/dL (ref 6–20)
CO2: 25 mmol/L (ref 22–32)
Calcium: 9.4 mg/dL (ref 8.9–10.3)
Chloride: 100 mmol/L (ref 98–111)
Creatinine, Ser: 0.96 mg/dL (ref 0.44–1.00)
GFR calc Af Amer: >60 mL/min (ref >60)
GFR calc non Af Amer: >60 mL/min (ref >60)
GLUCOSE: 91 mg/dL (ref 70–99)
POTASSIUM: 4.9 mmol/L (ref 3.5–5.1)
SODIUM: 135 mmol/L (ref 135–145)
TOTAL PROTEIN: 7.6 g/dL (ref 6.5–8.1)
Total Bilirubin: 1.4 mg/dL — ABNORMAL HIGH (ref 0.3–1.2)

## 2018-04-12 LAB — CBC
HCT: 43.6 % (ref 36.0–46.0)
Hemoglobin: 14.4 g/dL (ref 12.0–15.0)
MCH: 30.4 pg (ref 26.0–34.0)
MCHC: 33 g/dL (ref 30.0–36.0)
MCV: 92.2 fL (ref 78.0–100.0)
PLATELETS: 302 10*3/uL (ref 150–400)
RBC: 4.73 MIL/uL (ref 3.87–5.11)
RDW: 14.8 % (ref 11.5–15.5)
WBC: 6.7 10*3/uL (ref 4.0–10.5)

## 2018-04-12 NOTE — Patient Instructions (Addendum)
Your procedure is scheduled on: Tuesday, August 27  Enter through the Main Entrance of [facility] at: 6 am  Pick up the phone at the desk and dial [phone].  Call this number if you have problems the morning of surgery: [phone].  Remember: Do NOT eat food or Do NOT drink clear liquids (including water) after midnight Monday.  Take these medicines the morning of surgery with a SIP OF WATER: Corlanor, metoprolol, omeprazole, gabapentin and grifulvin.  Bring albuterol inhaler with you on day of surgery.  Stop herbal medications, vitamin supplements, Ibuprofen/NSAIDS at this time.  Do NOT wear jewelry (body piercing), metal hair clips/bobby pins, make-up, or nail polish. Do NOT wear lotions, powders, or perfumes.  You may wear deoderant. Do NOT shave for 48 hours prior to surgery. Do NOT bring valuables to the hospital.  Have a responsible adult drive you home and stay with you for 24 hours after your procedure.  Home with Mother Shirlee Limerick cell [phone]

## 2018-04-12 NOTE — Telephone Encounter (Signed)
Called and spoke with patient in regards to Cardiac Rehab - patient is interested in the program. She does have surgery on Tuesday 04/16/18. Will follow up with patient on Wednesday as Surgery is outpatient.

## 2018-04-15 ENCOUNTER — Telehealth: Payer: Self-pay | Admitting: Neurology

## 2018-04-15 NOTE — Procedures (Signed)
PATIENT'S NAME:  Sarah English, Sarah English DOB:      [birth year]      MR#:    [id number]     DATE OF RECORDING: 04/12/2018 REFERRING M.D.:  Truett Mainland, M.D. Study Performed:  Return for  CPAP  Titration HISTORY:  Mrs. Ericson was diagnosed with mild OSA at an AHI of 15.1, supine AHI of 24.3/h in a baseline PSG on 04-01-2018, an abnormal LBBB pattern on EKG was noted.  Her medical History is important for the pre procedure diagnoses of CHF, Hashimoto's, Morbid obesity grade 3, Sjgren's, Scleroderma, CKD.   The patient endorsed the Epworth Sleepiness Scale at 6 points and the Fatigue Score at 59/63 points.   The patient's weight 296 pounds with a height of 66 (inches), resulting in a BMI of 47.5 kg/m2. The patient's neck circumference measured 17.2 inches.  CURRENT MEDICATIONS: Proventil, Biotin, Camila, Keflex, Colanor, Flexeril, Restasis, Lasix, Probiotics, Toprol XL, Prilosec, Entresto, Selenimin, Ayr, Aldactone, Imitrex, Ultram, Neurontin.   PROCEDURE:  This is a multichannel digital polysomnogram utilizing the SomnoStar 11.2 system.  Electrodes and sensors were applied and monitored per AASM Specifications.   EEG, EOG, Chin and Limb EMG, were sampled at 200 Hz.  ECG, Snore and Nasal Pressure, Thermal Airflow, Respiratory Effort, CPAP Flow and Pressure, Oximetry was sampled at 50 Hz. Digital video and audio were recorded.      CPAP was initiated at 5 cmH20 with heated humidity per AASM split night standards and pressure was advanced to 10 cmH20 because of hypopneas, apneas and desaturations.  At a PAP pressure of 10 cmH20 with EPR of 1 cm water there was a reduction of the AHI to 0.0 with 99.4% sleep efficiency, and with 168.5 minutes of sleep - great improvement of sleep apnea. A ResMed nasal P 10 interface in medium size was used.   Lights Out was at 22:33 and Lights On at 05:00. Total recording time (TRT) was 387 minutes, with a total sleep time (TST) of 360 minutes. The patient's sleep latency was 26  minutes. REM latency was 71.5 minutes.  The sleep efficiency was 93.1 %.    SLEEP ARCHITECTURE: WASO (Wake after sleep onset) was 0.5 minutes.  There were 21 minutes in Stage N1, 164 minutes Stage N2, 68.5 minutes Stage N3 and 106.5 minutes in Stage REM.  The percentage of Stage N1 was 5.8%, Stage N2 was 45.6%, Stage N3 was19.1% and Stage R (REM sleep) was 29.6%. The sleep architecture was notable for unfragmented sleep.  RESPIRATORY ANALYSIS:  There was a total of 27 respiratory events: 14 obstructive apneas, 13 hypopneas with 0 respiratory event related arousals (RERAs).     The total APNEA/HYPOPNEA INDEX  (AHI) was 4.5 /hour and the total RESPIRATORY DISTURBANCE INDEX was 4.5 /hour  3 events occurred in REM sleep and 24 events in NREM. The REM AHI was 1.7 /hour versus a non-REM AHI of 5.7 /hour.  The patient spent 360 minutes of total sleep time in the supine position and 0 minutes in non-supine. The supine AHI was 4.5, versus a non-supine AHI of 0.0.  OXYGEN SATURATION & C02:  The baseline 02 saturation was 94%, with the lowest being 82%. Time spent below 89% saturation equaled 9 minutes. AROUSALS:  The patient had a total of 0 Periodic Limb Movements.  The arousals were noted as: 38 were spontaneous, 0 were associated with PLMs, and 20 were associated with respiratory events. EKG with BBB Post-study, the patient indicated that sleep was so much better than usual,  waking very refreshed.   DIAGNOSIS 1. Sleep Apnea alleviated at 10 cm water CPAP with 1 cm EPR, nasal pillow P10 in medium size.    PLANS/RECOMMENDATIONS: CPAP auto-titration capable device, Setting to 10 cm water with 1 cm EPR and nasal pillow P10 in medium size.  1. Maintain lean body weight. 2. The patient should avoid evening sedatives, hypnotics, and alcohol beverage consumption 3. CPAP therapy compliance is defined as 4 hours of nightly use.   A follow up appointment will be scheduled in the Sleep Clinic at [facility].   Please call [phone] with any questions.      I certify that I have reviewed the entire raw data recording prior to the issuance of this report in accordance with the Standards of Accreditation of the American Academy of Sleep Medicine (AASM)      Melvyn Novas, M.D.  04-14-2018  Diplomat, American Board of Psychiatry and Neurology  Diplomat, American Board of Sleep Medicine Medical Director, [facility]

## 2018-04-15 NOTE — Telephone Encounter (Signed)
-----   Message from Melvyn Novas, MD sent at 04/15/2018  2:14 PM EDT ----- 1. Sleep Apnea alleviated at 10 cm water CPAP with 1 cm EPR, nasal pillow P10 in medium size.    PLANS/RECOMMENDATIONS: CPAP auto-titration capable device, Setting to 10 cm water with 1 cm EPR and nasal pillow P10 in medium size.  1. Maintain lean body weight. 2. The patient should avoid evening sedatives, hypnotics, and alcohol beverage consumption 3. CPAP therapy compliance is defined as 4 hours or more of nightly use. Use CPAP if you take naps.   A follow up appointment will be scheduled in the Sleep Clinic at [facility].   Please call [phone] with any questions.     Cc Dr. Clarene Duke , MD

## 2018-04-15 NOTE — H&P (Signed)
Sarah English is an 42 y.o. female.  She was seen early February for problem visit with AUB and pelvic/abdominal pain. Restarted Nuvaring at that visit, but has continued to have some irregular bleeding, and was eventually changed to POP with minimal improvement. Also continues to have pelvic/low abdominal pain, no improvement since restarting Nuvaring or POP. No specific aggravating or relieving factors, trying to use breathing to help with pain-can't use NSAIDs or narcotics. No F/C, normal urination and BM. No other unusual discharge other than bleeding. not recently sexually active.  Pelvic ultrasound in March was essentially normal.  She was scheduled for surgery in April, but had to postpone due to some cardiac issues.  She is now stable for surgery.  Pertinent Gynecological History: Last mammogram: normal Date: 10/2017 Last pap: normal Date: 12/2013 OB History: G2, P1011   Menstrual History: Patient's last menstrual period was 04/05/2018 (approximate).    Past Medical History:  Diagnosis Date  . Anemia   . Autoimmune disorder (HCC)   . Bronchitis    hx -uses inhaler prn  . Chronic kidney disease    kidney infections  . Endometriosis   . Fibromyalgia   . GERD (gastroesophageal reflux disease)    occasional  . Hashimoto thyroiditis, fibrous variant   . Hashimoto's disease   . Headache    migraines  . History of kidney stones   . Hypertension   . IVCD (intraventricular conduction defect)   . LBBB (left bundle branch block)   . Nonischemic cardiomyopathy (HCC)    per stress test 12-17-2017  ef 14%;  per echo 12-13-2017 ef 21%  . Peripheral vascular disease (HCC)    raynoids syndrome  . Pneumonia    only x 1 - 2- 3 months ago  . 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 Scleroderma (HCC)   . Sjogren's syndrome (HCC)   . Systemic sclerosis (HCC)   . Systolic CHF with reduced left ventricular function, NYHA class 3 [facility])    cardiologist-  dr Sarah English  . Tendinitis    lower back  . Thyroiditis     Past Surgical History:  Procedure Laterality Date  . CESAREAN SECTION  2004   x 1  . CHOLECYSTECTOMY    . COLONOSCOPY    . CYSTOSCOPY     x 2 - bladder as a child  . MANDIBLE FRACTURE SURGERY  1994   upper mandibular  . RIGHT/LEFT HEART CATH AND CORONARY ANGIOGRAPHY N/A 12/18/2017   Procedure: RIGHT/LEFT HEART CATH AND CORONARY ANGIOGRAPHY;  Surgeon: Sarah Negus, MD;  Location: [facility] INVASIVE CV LAB;  Service: Cardiovascular;  Laterality: N/A;  . UPPER GI ENDOSCOPY    . WISDOM TOOTH EXTRACTION      Family History  Problem Relation Age of Onset  . Diabetes Mother        Type II  . Heart disease Mother        stent surgery  . Hypertension Mother   . Hyperlipidemia Mother   . Diabetes Father        Type II  . Hypertension Father   . Hyperlipidemia Father     Social History:  reports that she quit smoking about 5 years ago. Her smoking use included cigarettes. She has a 4.00 pack-year smoking history. She has never used smokeless tobacco. She reports that she drinks alcohol. She reports that she does not use drugs.  Allergies:  Allergies  Allergen Reactions  .  Sulfa Antibiotics Nausea And Vomiting    No medications prior to admission.    Review of Systems  Respiratory: Negative.   Cardiovascular: Negative.     Height 5\' 6"  (1.676 m), weight 134.3 kg, last menstrual period 04/05/2018. Physical Exam  Constitutional: She appears well-developed and well-nourished.  Cardiovascular: Normal rate, regular rhythm and normal heart sounds.  Respiratory: Effort normal and breath sounds normal. No respiratory distress.  GI: Soft. There is tenderness (diffuse, RLQ greates).  Transverse scar  Genitourinary: Vagina normal and uterus normal.  Genitourinary Comments: bilat adnexal tenderness, R>L    No results found for this or any previous visit (from the past 24 hour(s)).  No results  found.  Assessment/Plan: Chronic pelvic pain, abnormal uterine bleeding.  All medical and surgical options have been discussed.  Surgical procedure, risks, chances of relieving symptoms have all been discussed.  Will admit for diagnostic/possible operative laparoscopy, hysteroscopy D&C.  Sarah English 04/15/2018, 9:29 PM

## 2018-04-15 NOTE — Addendum Note (Signed)
Addended by: Melvyn Novas on: 04/15/2018 02:14 PM   Modules accepted: Orders

## 2018-04-15 NOTE — Telephone Encounter (Signed)
I called pt. I advised pt that Dr. Vickey Huger reviewed their sleep study results and found that pt has sleep apnea. Dr. Vickey Huger recommends that pt starts a CPAP at a pressure of 10 cm . I reviewed PAP compliance expectations with the pt. Pt is agreeable to starting a CPAP. I advised pt that an order will be sent to a DME, [facility], and [facility] will call the pt within about one week after they file with the pt's insurance. [facility] will show the pt how to use the machine, fit for masks, and troubleshoot the CPAP if needed. A follow up appt was made for insurance purposes with Butch Penny, NP on Nov 14,2019 at 10:00 am Pt verbalized understanding to arrive 15 minutes early and bring their CPAP. A letter with all of this information in it will be mailed to the pt as a reminder. I verified with the pt that the address we have on file is correct. Pt verbalized understanding of results. Pt had no questions at this time but was encouraged to call back if questions arise.

## 2018-04-16 ENCOUNTER — Ambulatory Visit (HOSPITAL_COMMUNITY): Payer: Medicaid Other | Admitting: Anesthesiology

## 2018-04-16 ENCOUNTER — Encounter (HOSPITAL_COMMUNITY)
Admission: RE | Disposition: A | Discharge status: Home / Self Care | Payer: Self-pay | Source: Ambulatory Visit | Attending: Obstetrics and Gynecology

## 2018-04-16 ENCOUNTER — Other Ambulatory Visit: Payer: Self-pay

## 2018-04-16 ENCOUNTER — Ambulatory Visit (HOSPITAL_COMMUNITY)
Admission: RE | Admit: 2018-04-16 | Discharge: 2018-04-16 | Disposition: A | Discharge status: Home / Self Care | Payer: Medicaid Other | Source: Ambulatory Visit | Attending: Obstetrics and Gynecology | Admitting: Obstetrics and Gynecology

## 2018-04-16 ENCOUNTER — Encounter (HOSPITAL_COMMUNITY): Payer: Self-pay | Admitting: *Deleted

## 2018-04-16 DIAGNOSIS — R102 Pelvic and perineal pain: Secondary | ICD-10-CM | POA: Diagnosis present

## 2018-04-16 DIAGNOSIS — K660 Peritoneal adhesions (postprocedural) (postinfection): Secondary | ICD-10-CM | POA: Insufficient documentation

## 2018-04-16 DIAGNOSIS — I5022 Chronic systolic (congestive) heart failure: Secondary | ICD-10-CM | POA: Insufficient documentation

## 2018-04-16 DIAGNOSIS — Z87891 Personal history of nicotine dependence: Secondary | ICD-10-CM | POA: Insufficient documentation

## 2018-04-16 DIAGNOSIS — I130 Hypertensive heart and chronic kidney disease with heart failure and stage 1 through stage 4 chronic kidney disease, or unspecified chronic kidney disease: Secondary | ICD-10-CM | POA: Diagnosis not present

## 2018-04-16 DIAGNOSIS — G4730 Sleep apnea, unspecified: Secondary | ICD-10-CM | POA: Diagnosis not present

## 2018-04-16 DIAGNOSIS — G8929 Other chronic pain: Secondary | ICD-10-CM | POA: Diagnosis not present

## 2018-04-16 DIAGNOSIS — N939 Abnormal uterine and vaginal bleeding, unspecified: Secondary | ICD-10-CM | POA: Diagnosis present

## 2018-04-16 DIAGNOSIS — Z6841 Body Mass Index (BMI) 40.0 and over, adult: Secondary | ICD-10-CM | POA: Insufficient documentation

## 2018-04-16 DIAGNOSIS — N189 Chronic kidney disease, unspecified: Secondary | ICD-10-CM | POA: Diagnosis not present

## 2018-04-16 HISTORY — DX: Other cardiomyopathies: I42.8

## 2018-04-16 HISTORY — PX: HYSTEROSCOPY WITH D & C: SHX1775

## 2018-04-16 HISTORY — PX: LAPAROSCOPIC LYSIS OF ADHESIONS: SHX5905

## 2018-04-16 HISTORY — DX: Pulmonary hypertension, unspecified: I27.20

## 2018-04-16 HISTORY — DX: Nonspecific intraventricular block: I45.4

## 2018-04-16 HISTORY — DX: Sleep apnea, unspecified: G47.30

## 2018-04-16 HISTORY — DX: Unspecified systolic (congestive) heart failure: I50.20

## 2018-04-16 HISTORY — DX: Systemic sclerosis, unspecified: M34.9

## 2018-04-16 LAB — PREGNANCY, URINE: Preg Test, Ur: NEGATIVE

## 2018-04-16 SURGERY — DILATATION AND CURETTAGE /HYSTEROSCOPY
Anesthesia: General

## 2018-04-16 MED ORDER — SUCCINYLCHOLINE CHLORIDE 20 MG/ML IJ SOLN
INTRAMUSCULAR | Status: DC | PRN
  Administered 2018-04-16: 120 mg via INTRAVENOUS

## 2018-04-16 MED ORDER — FENTANYL CITRATE (PF) 100 MCG/2ML IJ SOLN: 25.0000 ug | INTRAMUSCULAR | Status: DC | PRN

## 2018-04-16 MED ORDER — SCOPOLAMINE 1 MG/3DAYS TD PT72
MEDICATED_PATCH | TRANSDERMAL | Status: AC
  Administered 2018-04-16: 1.5 mg via TRANSDERMAL
  Filled 2018-04-16: qty 1

## 2018-04-16 MED ORDER — LIDOCAINE HCL (CARDIAC) PF 100 MG/5ML IV SOSY
PREFILLED_SYRINGE | INTRAVENOUS | Status: DC | PRN
  Administered 2018-04-16: 100 mg via INTRAVENOUS

## 2018-04-16 MED ORDER — FENTANYL CITRATE (PF) 100 MCG/2ML IJ SOLN
INTRAMUSCULAR | Status: DC | PRN
  Administered 2018-04-16 (×5): 50 ug via INTRAVENOUS

## 2018-04-16 MED ORDER — ETOMIDATE 2 MG/ML IV SOLN
INTRAVENOUS | Status: AC
  Filled 2018-04-16: qty 10

## 2018-04-16 MED ORDER — PROPOFOL 10 MG/ML IV BOLUS
INTRAVENOUS | Status: AC
  Filled 2018-04-16: qty 20

## 2018-04-16 MED ORDER — METOCLOPRAMIDE HCL 5 MG/ML IJ SOLN: 10.0000 mg | Freq: Once | INTRAMUSCULAR | Status: DC | PRN

## 2018-04-16 MED ORDER — ETOMIDATE 2 MG/ML IV SOLN
INTRAVENOUS | Status: DC | PRN
  Administered 2018-04-16: 16 mg via INTRAVENOUS

## 2018-04-16 MED ORDER — LACTATED RINGERS IV SOLN
INTRAVENOUS | Status: DC
  Administered 2018-04-16: 07:00:00 via INTRAVENOUS

## 2018-04-16 MED ORDER — KETOROLAC TROMETHAMINE 30 MG/ML IJ SOLN
INTRAMUSCULAR | Status: AC
  Filled 2018-04-16: qty 1

## 2018-04-16 MED ORDER — ROCURONIUM BROMIDE 100 MG/10ML IV SOLN
INTRAVENOUS | Status: AC
  Filled 2018-04-16: qty 1

## 2018-04-16 MED ORDER — ONDANSETRON HCL 4 MG/2ML IJ SOLN
INTRAMUSCULAR | Status: DC | PRN
  Administered 2018-04-16: 4 mg via INTRAVENOUS

## 2018-04-16 MED ORDER — HYDROCODONE-ACETAMINOPHEN 5-325 MG PO TABS: 1.0000 | ORAL_TABLET | Freq: Four times a day (QID) | ORAL | 0 refills | Status: DC | PRN

## 2018-04-16 MED ORDER — LIDOCAINE HCL 1 % IJ SOLN
INTRAMUSCULAR | Status: AC
  Filled 2018-04-16: qty 20

## 2018-04-16 MED ORDER — SODIUM CHLORIDE 0.9 % IR SOLN
Status: DC | PRN
  Administered 2018-04-16: 3000 mL

## 2018-04-16 MED ORDER — DEXAMETHASONE SODIUM PHOSPHATE 10 MG/ML IJ SOLN
INTRAMUSCULAR | Status: DC | PRN
  Administered 2018-04-16: 10 mg via INTRAVENOUS

## 2018-04-16 MED ORDER — SUCCINYLCHOLINE CHLORIDE 200 MG/10ML IV SOSY
PREFILLED_SYRINGE | INTRAVENOUS | Status: AC
  Filled 2018-04-16: qty 10

## 2018-04-16 MED ORDER — ONDANSETRON HCL 4 MG/2ML IJ SOLN
INTRAMUSCULAR | Status: AC
  Filled 2018-04-16: qty 2

## 2018-04-16 MED ORDER — SODIUM CHLORIDE 0.9 % IJ SOLN
INTRAMUSCULAR | Status: DC | PRN
  Administered 2018-04-16: 10 mL

## 2018-04-16 MED ORDER — ROCURONIUM BROMIDE 100 MG/10ML IV SOLN
INTRAVENOUS | Status: DC | PRN
  Administered 2018-04-16: 30 mg via INTRAVENOUS

## 2018-04-16 MED ORDER — MIDAZOLAM HCL 2 MG/2ML IJ SOLN
INTRAMUSCULAR | Status: AC
  Filled 2018-04-16: qty 2

## 2018-04-16 MED ORDER — LIDOCAINE HCL 2 % IJ SOLN
INTRAMUSCULAR | Status: DC | PRN
  Administered 2018-04-16: 16 mL

## 2018-04-16 MED ORDER — SUGAMMADEX SODIUM 500 MG/5ML IV SOLN
INTRAVENOUS | Status: DC | PRN
  Administered 2018-04-16: 300 mg via INTRAVENOUS

## 2018-04-16 MED ORDER — LIDOCAINE HCL (CARDIAC) PF 100 MG/5ML IV SOSY
PREFILLED_SYRINGE | INTRAVENOUS | Status: AC
  Filled 2018-04-16: qty 5

## 2018-04-16 MED ORDER — SCOPOLAMINE 1 MG/3DAYS TD PT72
1.0000 | MEDICATED_PATCH | Freq: Once | TRANSDERMAL | Status: DC
  Administered 2018-04-16: 1.5 mg via TRANSDERMAL

## 2018-04-16 MED ORDER — MEPERIDINE HCL 25 MG/ML IJ SOLN: 6.2500 mg | INTRAMUSCULAR | Status: DC | PRN

## 2018-04-16 MED ORDER — GLYCOPYRROLATE 0.2 MG/ML IJ SOLN
INTRAMUSCULAR | Status: AC
  Filled 2018-04-16: qty 1

## 2018-04-16 MED ORDER — MIDAZOLAM HCL 2 MG/2ML IJ SOLN
INTRAMUSCULAR | Status: DC | PRN
  Administered 2018-04-16: 1 mg via INTRAVENOUS

## 2018-04-16 MED ORDER — BUPIVACAINE HCL (PF) 0.25 % IJ SOLN
INTRAMUSCULAR | Status: DC | PRN
  Administered 2018-04-16: 14 mL

## 2018-04-16 MED ORDER — FENTANYL CITRATE (PF) 250 MCG/5ML IJ SOLN
INTRAMUSCULAR | Status: AC
  Filled 2018-04-16: qty 5

## 2018-04-16 MED ORDER — DEXAMETHASONE SODIUM PHOSPHATE 10 MG/ML IJ SOLN
INTRAMUSCULAR | Status: AC
  Filled 2018-04-16: qty 1

## 2018-04-16 MED ORDER — LACTATED RINGERS IV SOLN: INTRAVENOUS | Status: DC

## 2018-04-16 MED ORDER — BUPIVACAINE HCL (PF) 0.25 % IJ SOLN
INTRAMUSCULAR | Status: AC
  Filled 2018-04-16: qty 30

## 2018-04-16 MED ORDER — SODIUM CHLORIDE 0.9 % IJ SOLN
INTRAMUSCULAR | Status: AC
  Filled 2018-04-16: qty 10

## 2018-04-16 SURGICAL SUPPLY — 47 items
ABLATOR SURESOUND NOVASURE (ABLATOR) IMPLANT
BIPOLAR CUTTING LOOP 21FR (ELECTRODE)
CABLE HIGH FREQUENCY MONO STRZ (ELECTRODE) IMPLANT
CANISTER SUCT 3000ML PPV (MISCELLANEOUS) ×4 IMPLANT
CATH ROBINSON RED A/P 16FR (CATHETERS) ×4 IMPLANT
DECANTER SPIKE VIAL GLASS SM (MISCELLANEOUS) ×4 IMPLANT
DERMABOND ADVANCED (GAUZE/BANDAGES/DRESSINGS) ×2
DERMABOND ADVANCED .7 DNX12 (GAUZE/BANDAGES/DRESSINGS) ×2 IMPLANT
DRSG OPSITE POSTOP 3X4 (GAUZE/BANDAGES/DRESSINGS) IMPLANT
DURAPREP 26ML APPLICATOR (WOUND CARE) ×4 IMPLANT
ELECT COAG BIPOL BALL 21FR (ELECTRODE) IMPLANT
ELECT REM PT RETURN 9FT ADLT (ELECTROSURGICAL)
ELECTRODE REM PT RTRN 9FT ADLT (ELECTROSURGICAL) IMPLANT
GLOVE BIO SURGEON STRL SZ8 (GLOVE) ×4 IMPLANT
GLOVE BIOGEL PI IND STRL 7.0 (GLOVE) ×4 IMPLANT
GLOVE BIOGEL PI INDICATOR 7.0 (GLOVE) ×4
GLOVE ORTHO TXT STRL SZ7.5 (GLOVE) ×4 IMPLANT
GOWN STRL REUS W/TWL 2XL LVL3 (GOWN DISPOSABLE) ×4 IMPLANT
GOWN STRL REUS W/TWL LRG LVL3 (GOWN DISPOSABLE) ×8 IMPLANT
LOOP CUTTING BIPOLAR 21FR (ELECTRODE) IMPLANT
NEEDLE EPID 17G 5 ECHO TUOHY (NEEDLE) IMPLANT
NEEDLE INSUFFLATION 120MM (ENDOMECHANICALS) ×4 IMPLANT
NS IRRIG 1000ML POUR BTL (IV SOLUTION) ×4 IMPLANT
PACK LAPAROSCOPY BASIN (CUSTOM PROCEDURE TRAY) ×4 IMPLANT
PACK TRENDGUARD 450 HYBRID PRO (MISCELLANEOUS) IMPLANT
PACK TRENDGUARD 600 HYBRD PROC (MISCELLANEOUS) ×2 IMPLANT
PACK VAGINAL MINOR WOMEN LF (CUSTOM PROCEDURE TRAY) ×4 IMPLANT
PAD OB MATERNITY 4.3X12.25 (PERSONAL CARE ITEMS) ×4 IMPLANT
POUCH SPECIMEN RETRIEVAL 10MM (ENDOMECHANICALS) IMPLANT
PROTECTOR NERVE ULNAR (MISCELLANEOUS) ×8 IMPLANT
SET IRRIG TUBING LAPAROSCOPIC (IRRIGATION / IRRIGATOR) IMPLANT
SHEARS HARMONIC ACE PLUS 36CM (ENDOMECHANICALS) ×4 IMPLANT
SLEEVE XCEL OPT CAN 5 100 (ENDOMECHANICALS) ×4 IMPLANT
SOLUTION ELECTROLUBE (MISCELLANEOUS) ×4 IMPLANT
SUT VICRYL 0 UR6 27IN ABS (SUTURE) IMPLANT
SUT VICRYL 4-0 PS2 18IN ABS (SUTURE) ×4 IMPLANT
TOWEL OR 17X24 6PK STRL BLUE (TOWEL DISPOSABLE) ×8 IMPLANT
TRAY FOLEY W/BAG SLVR 14FR (SET/KITS/TRAYS/PACK) IMPLANT
TRENDGUARD 450 HYBRID PRO PACK (MISCELLANEOUS)
TRENDGUARD 600 HYBRID PROC PK (MISCELLANEOUS) ×4
TROCAR BALLN 12MMX100 BLUNT (TROCAR) IMPLANT
TROCAR XCEL NON-BLD 11X100MML (ENDOMECHANICALS) IMPLANT
TROCAR XCEL NON-BLD 5MMX100MML (ENDOMECHANICALS) ×4 IMPLANT
TUBING AQUILEX INFLOW (TUBING) ×4 IMPLANT
TUBING AQUILEX OUTFLOW (TUBING) ×4 IMPLANT
TUBING INSUF HEATED (TUBING) ×4 IMPLANT
WARMER LAPAROSCOPE (MISCELLANEOUS) ×4 IMPLANT

## 2018-04-16 NOTE — Transfer of Care (Signed)
Immediate Anesthesia Transfer of Care Note  Patient: Sarah English  Procedure(s) Performed: DILATATION AND CURETTAGE /HYSTEROSCOPY (N/A ) LAPAROSCOPIC LYSIS OF ADHESIONS  Patient Location: PACU  Anesthesia Type:General  Level of Consciousness: awake, alert  and oriented  Airway & Oxygen Therapy: Patient Spontanous Breathing and Patient connected to face mask oxygen  Post-op Assessment: Report given to RN, Post -op Vital signs reviewed and stable and Patient moving all extremities X 4  Post vital signs: Reviewed and stable  Last Vitals:  Vitals Value Taken Time  BP 130/60 04/16/2018  8:52 AM  Temp    Pulse 72 04/16/2018  8:58 AM  Resp 17 04/16/2018  8:58 AM  SpO2 100 % 04/16/2018  8:58 AM  Vitals shown include unvalidated device data.  Last Pain:  Vitals:   04/16/18 8250  TempSrc: Oral  PainSc: 3       Patients Stated Pain Goal: 3 (04/16/18 5397)  Complications: No apparent anesthesia complications

## 2018-04-16 NOTE — Discharge Instructions (Signed)
DISCHARGE INSTRUCTIONS: Laparoscopy  The following instructions have been prepared to help you care for yourself upon your return home today.  Wound care:  Do not get the incision wet for the first 24 hours. The incision should be kept clean and dry.  Should the incision become sore, red, and swollen after the first week, check with your doctor.  Personal hygiene:  Shower the day after your procedure.  Activity and limitations:  Do NOT drive or operate any equipment today.  Do NOT lift anything more than 15 pounds for 2-3 weeks after surgery.  Do NOT rest in bed all day.  Walking is encouraged. Walk each day, starting slowly with 5-minute walks 3 or 4 times a day. Slowly increase the length of your walks.  Walk up and down stairs slowly.  Do NOT do strenuous activities, such as golfing, playing tennis, bowling, running, biking, weight lifting, gardening, mowing, or vacuuming for 2-4 weeks. Ask your doctor when it is okay to start.  Return to work: This is dependent on the type of work you do. For the most part you can return to a desk job within a week of surgery. If you are more active at work, please discuss this with your doctor.  What to expect after your surgery: You may have a slight burning sensation when you urinate on the first day. You may have a very small amount of blood in the urine. Expect to have a small amount of vaginal discharge/light bleeding for 1-2 weeks. It is not unusual to have abdominal soreness and bruising for up to 2 weeks. You may be tired and need more rest for about 1 week. You may experience shoulder pain for 24-72 hours. Lying flat in bed may relieve it.  Call your doctor for any of the following:  Develop a fever of 100.4 or greater  Inability to urinate 6 hours after discharge from hospital  Severe pain not relieved by pain medications  Persistent of heavy bleeding at incision site  Redness or swelling around incision site after a week   Increasing nausea or vomiting  DISCHARGE INSTRUCTIONS: D&C / Hysteroscopy The following instructions have been prepared to help you care for yourself upon your return home.   Personal hygiene:  Use sanitary pads for vaginal drainage, not tampons.  Shower the day after your procedure.  NO tub baths, pools or Jacuzzis for 2-3 weeks.  Wipe front to back after using the bathroom.  Activity and limitations:  Do NOT drive or operate any equipment for 24 hours. The effects of anesthesia are still present and drowsiness may result.  Do NOT rest in bed all day.  Walking is encouraged.  Walk up and down stairs slowly.  You may resume your normal activity in one to two days or as indicated by your physician.  Sexual activity: NO intercourse for at least 2 weeks after the procedure, or as indicated by your physician.  Return to work: You may resume your work activities in one to two days or as indicated by your doctor.  What to expect after your surgery: Expect to have vaginal bleeding/discharge for 2-3 days and spotting for up to 10 days. It is not unusual to have soreness for up to 1-2 weeks. You may have a slight burning sensation when you urinate for the first day. Mild cramps may continue for a couple of days. You may have a regular period in 2-6 weeks.  Call your doctor for any of the following:  Excessive vaginal bleeding, saturating and changing one pad every hour.  Inability to urinate 6 hours after discharge from hospital.  Pain not relieved by pain medication.  Fever of 100.4 F or greater.  Unusual vaginal discharge or odor.  Post Anesthesia Home Care Instructions  Activity: Get plenty of rest for the remainder of the day. A responsible individual must stay with you for 24 hours following the procedure.  For the next 24 hours, DO NOT: -Drive a car -Advertising copywriter -Drink alcoholic beverages -Take any medication unless instructed by your physician -Make any  legal decisions or sign important papers.  Meals: Start with liquid foods such as gelatin or soup. Progress to regular foods as tolerated. Avoid greasy, spicy, heavy foods. If nausea and/or vomiting occur, drink only clear liquids until the nausea and/or vomiting subsides. Call your physician if vomiting continues.  Special Instructions/Symptoms: Your throat may feel dry or sore from the anesthesia or the breathing tube placed in your throat during surgery. If this causes discomfort, gargle with warm salt water. The discomfort should disappear within 24 hours.  If you had a scopolamine patch placed behind your ear for the management of post- operative nausea and/or vomiting:  1. The medication in the patch is effective for 72 hours, after which it should be removed.  Wrap patch in a tissue and discard in the trash. Wash hands thoroughly with soap and water. 2. You may remove the patch earlier than 72 hours if you experience unpleasant side effects which may include dry mouth, dizziness or visual disturbances. 3. Avoid touching the patch. Wash your hands with soap and water after contact with the patch.

## 2018-04-16 NOTE — Interval H&P Note (Signed)
History and Physical Interval Note:  04/16/2018 7:06 AM  Sarah English  has presented today for surgery, with the diagnosis of pelvic pain, AUB  The various methods of treatment have been discussed with the patient and family. After consideration of risks, benefits and other options for treatment, the patient has consented to  Procedure(s) with comments: DILATATION AND CURETTAGE /HYSTEROSCOPY (N/A) LAPAROSCOPY DIAGNOSTIC (N/A) POSSIBLE LAPAROSCOPY OPERATIVE (N/A) - possible as a surgical intervention .  The patient's history has been reviewed, patient examined, no change in status, stable for surgery.  I have reviewed the patient's chart and labs.  Questions were answered to the patient's satisfaction.     Leighton Roach Aylene Acoff

## 2018-04-16 NOTE — Anesthesia Procedure Notes (Signed)
Procedure Name: Intubation Date/Time: 04/16/2018 7:42 AM Performed by: Lenox Ahr, CRNA Pre-anesthesia Checklist: Patient identified, Patient being monitored, Timeout performed, Emergency Drugs available and Suction available Patient Re-evaluated:Patient Re-evaluated prior to induction Oxygen Delivery Method: Circle System Utilized Preoxygenation: Pre-oxygenation with 100% oxygen Induction Type: IV induction Ventilation: Mask ventilation without difficulty Laryngoscope Size: Miller and 2 Grade View: Grade II Tube type: Oral Tube size: 7.0 mm Number of attempts: 1 Airway Equipment and Method: stylet Placement Confirmation: ETT inserted through vocal cords under direct vision,  positive ETCO2 and breath sounds checked- equal and bilateral Secured at: 22 cm Tube secured with: Tape Dental Injury: Teeth and Oropharynx as per pre-operative assessment

## 2018-04-16 NOTE — Anesthesia Postprocedure Evaluation (Signed)
Anesthesia Post Note  Patient: Sarah English  Procedure(s) Performed: DILATATION AND CURETTAGE /HYSTEROSCOPY (N/A ) LAPAROSCOPIC LYSIS OF ADHESIONS     Patient location during evaluation: PACU Anesthesia Type: General Level of consciousness: awake and alert Pain management: pain level controlled Vital Signs Assessment: post-procedure vital signs reviewed and stable Respiratory status: spontaneous breathing, nonlabored ventilation, respiratory function stable and patient connected to nasal cannula oxygen Cardiovascular status: blood pressure returned to baseline and stable Postop Assessment: no apparent nausea or vomiting Anesthetic complications: no    Last Vitals:  Vitals:   04/16/18 0945 04/16/18 1030  BP: 115/61 124/60  Pulse: 70 61  Resp: 17 16  Temp:    SpO2: 97% 95%    Last Pain:  Vitals:   04/16/18 1030  TempSrc:   PainSc: 3    Pain Goal: Patients Stated Pain Goal: 3 (04/16/18 1030)               Phillips Grout

## 2018-04-16 NOTE — Op Note (Signed)
Preoperative diagnosis: Pelvic pain, AUB Postoperative diagnosis: Same, omental adhesion Procedure: Diagnostic laparoscopy with adhesiolysis, hysteroscopy, D&C Surgeon: Lavina Hamman M.D.  Anesthesia: Gen. Endotracheal tube  Findings: She had a normal abdomen and pelvis with normal uterus tubes and ovaries, normal appendix, omentum adherent to anterior abdominal wall.  Normal endometrial cavity  Specimens: Endometrial curettings Estimated blood loss: Minimal  Fluid deficit:  40 cc Complications: None  Procedure in detail:  The patient was taken to the operating room and placed in the dorsosupine position. General anesthesia was induced. Her legs were placed in mobile stirrups and her left arm was tucked to her side. Abdomen perineum and vagina were then prepped and draped in the usual sterile fashion, bladder drained with a red Robinson catheter.  Infraumbilical skin was then infiltrated with quarter percent Marcaine and a 1 cm vertical incision was made. The veress needle was inserted into the peritoneal cavity and placement confirmed by the water drop test and an opening pressure of 9 mm of mercury. CO2 was insufflated to a pressure of 12 mm of mercury and the veress needle was removed. A 33mm disposable trocar was then introduced with direct visualization with the laparoscope. A 5 mm port was then placed on the left side also under direct visualization. Careful and thorough inspection revealed the above-mentioned findings.  Omental adhesion was taken down with harmonic scalpel.  No other source for pain was identified.  She had some white nodules in her right mesosalpinx, these were ablated with harmonic scalpel.  The 5 mm port was removed under direct visualization. All gas was allowed to deflate from the abdomen and the umbilical trocar was removed. Skin incisions were then closed with interrupted subcuticular sutures of 4-0 Vicryl followed by Dermabond.   Attention was turned vaginally.  Legs  were elevated in the stirrups.  A Graves speculum was inserted in the vagina and the anterior lip of the cervix was grasped with a single-tooth tenaculum. Paracervical block was performed with a total of 16 cc of 1% plain lidocaine. The uterus then sounded to 8 cm. The cervix was gradually dilated to a size 19 dilator without difficulty. The observer hysteroscope was inserted and good visualization was achieved. No significant endometrial lesions were identified. The hysteroscope was removed. Sharp curettage was performed which revealed minimal endometrial tissue. Hysteroscopy was again performed and revealed a small amount of tissue in the posterior midline. The hysteroscope was again removed and curettage was performed return remove this tissue. Hysteroscopy confirmed removal of the vast majority of endometrial tissue. The hysteroscope was again removed. The single-tooth tenaculum was removed from the cervix and bleeding was controlled with pressure. All instruments were then removed from the vagina. The patient tolerated the procedure well and was taken to the recovery in stable condition. Counts were correct and she had PAS hose on throughout the procedure.

## 2018-04-16 NOTE — Anesthesia Preprocedure Evaluation (Signed)
Anesthesia Evaluation  Patient identified by MRN, date of birth, ID band Patient awake    Reviewed: Allergy & Precautions, NPO status , Patient's Chart, lab work & pertinent test results  Airway Mallampati: II  TM Distance: >3 FB Neck ROM: Full    Dental no notable dental hx.    Pulmonary sleep apnea , former smoker,    Pulmonary exam normal breath sounds clear to auscultation       Cardiovascular Exercise Tolerance: Poor hypertension, +CHF (EF 21%)  Normal cardiovascular exam Rhythm:Regular Rate:Normal  Severe nonischemic dilated cardiomyopathy LBBB   Neuro/Psych negative neurological ROS  negative psych ROS   GI/Hepatic negative GI ROS, Neg liver ROS,   Endo/Other  Morbid obesity  Renal/GU negative Renal ROS  negative genitourinary   Musculoskeletal  (+) Fibromyalgia -Multiple autoimmune issues   Abdominal   Peds negative pediatric ROS (+)  Hematology negative hematology ROS (+)   Anesthesia Other Findings   Reproductive/Obstetrics negative OB ROS                             Anesthesia Physical Anesthesia Plan  ASA: IV  Anesthesia Plan: General   Post-op Pain Management:    Induction: Intravenous  PONV Risk Score and Plan: 3 and Ondansetron, Treatment may vary due to age or medical condition, Dexamethasone, Midazolam and Scopolamine patch - Pre-op  Airway Management Planned: Oral ETT  Additional Equipment:   Intra-op Plan:   Post-operative Plan: Extubation in OR  Informed Consent: I have reviewed the patients History and Physical, chart, labs and discussed the procedure including the risks, benefits and alternatives for the proposed anesthesia with the patient or authorized representative who has indicated his/her understanding and acceptance.   Dental advisory given  Plan Discussed with: CRNA  Anesthesia Plan Comments:         Anesthesia Quick Evaluation

## 2018-04-17 ENCOUNTER — Telehealth (HOSPITAL_COMMUNITY): Payer: Self-pay

## 2018-04-17 ENCOUNTER — Encounter (HOSPITAL_COMMUNITY): Payer: Self-pay | Admitting: Obstetrics and Gynecology

## 2018-04-17 NOTE — Telephone Encounter (Signed)
Called to follow up with patient in regards to surgery and Cardiac Rehab. Patient stated surgery went really well. Scheduled orientation on 05/28/18 at 8:30am. Patient will attend the 9:45am exc class. Mailed packet.

## 2018-04-26 ENCOUNTER — Other Ambulatory Visit (INDEPENDENT_AMBULATORY_CARE_PROVIDER_SITE_OTHER): Payer: Medicaid Other

## 2018-04-26 ENCOUNTER — Ambulatory Visit: Payer: Medicaid Other | Admitting: Neurology

## 2018-04-26 ENCOUNTER — Encounter: Payer: Self-pay | Admitting: Neurology

## 2018-04-26 VITALS — BP 110/70 | HR 72 | Ht 66.0 in | Wt 300.0 lb

## 2018-04-26 DIAGNOSIS — G5601 Carpal tunnel syndrome, right upper limb: Secondary | ICD-10-CM | POA: Diagnosis not present

## 2018-04-26 DIAGNOSIS — R202 Paresthesia of skin: Secondary | ICD-10-CM

## 2018-04-26 DIAGNOSIS — R413 Other amnesia: Secondary | ICD-10-CM

## 2018-04-26 LAB — VITAMIN B12: VITAMIN B 12: 379 pg/mL (ref 211–911)

## 2018-04-26 LAB — FOLATE: Folate: 15.3 ng/mL (ref >5.9)

## 2018-04-26 MED ORDER — GABAPENTIN 300 MG PO CAPS: 300.0000 mg | ORAL_CAPSULE | Freq: Three times a day (TID) | ORAL | 5 refills | Status: DC

## 2018-04-26 NOTE — Patient Instructions (Addendum)
NCS/EMG of the legs and NCS/EMG of the arm on separate days  Check labs
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
## 2018-04-30 LAB — IMMUNOFIXATION ELECTROPHORESIS
IGG (IMMUNOGLOBIN G), SERUM: 1340 mg/dL (ref 600–1640)
IGM, SERUM: 44 mg/dL — AB (ref 50–300)
IMMUNOGLOBULIN A: 216 mg/dL (ref 47–310)
Immunofix Electr Int: NOT DETECTED

## 2018-04-30 LAB — PROTEIN ELECTROPHORESIS, SERUM
ALPHA 1: 0.3 g/dL (ref 0.2–0.3)
Albumin ELP: 4.3 g/dL (ref 3.8–4.8)
Alpha 2: 0.7 g/dL (ref 0.5–0.9)
Beta 2: 0.4 g/dL (ref 0.2–0.5)
Beta Globulin: 0.5 g/dL (ref 0.4–0.6)
GAMMA GLOBULIN: 1.2 g/dL (ref 0.8–1.7)
Total Protein: 7.4 g/dL (ref 6.1–8.1)

## 2018-04-30 LAB — COPPER, SERUM: Copper: 106 ug/dL (ref 70–175)

## 2018-05-09 ENCOUNTER — Ambulatory Visit (INDEPENDENT_AMBULATORY_CARE_PROVIDER_SITE_OTHER): Payer: Medicaid Other | Admitting: Neurology

## 2018-05-09 DIAGNOSIS — R202 Paresthesia of skin: Secondary | ICD-10-CM

## 2018-05-09 NOTE — Procedures (Signed)
[facility]  [address] [place], [address]  [place], [place] [address] Tel: [phone] Fax:  [phone] Test Date:  05/09/2018  Patient: Sarah English DOB: 1975/10/02 Physician: Nita Sickle, DO  Sex: Female Height: ' 0" Ref Phys: Nita Sickle, DO  ID#: [id number] Temp: 35.0C Technician:    Patient Complaints: This is a 42 year old female referred for evaluation of diffuse paresthesias of the hands and feet.  NCV & EMG Findings: Electrodiagnostic testing of the right lower extremity and additional studies of the left shows: 1. Bilateral sural and superficial peroneal sensory responses are within normal limits. 2. Bilateral peroneal and tibial motor responses are within normal limits. 3. Bilateral tibial H-reflex studies are within normal limits. 4. There is no evidence of active or chronic motor axonal changes affecting any of the tested muscles.  Motor unit configuration and recruitment pattern is within normal limits.   Impression: This is a normal study of the lower extremities.  In particular, there is no evidence of a large fiber sensorimotor polyneuropathy or lumbosacral radiculopathy.     ___________________________ Nita Sickle, DO    Nerve Conduction Studies Anti Sensory Summary Table   Site NR Peak (ms) Norm Peak (ms) P-T Amp (V) Norm P-T Amp  Left Sup Peroneal Anti Sensory (Ant Lat Mall)  35C  12 cm    1.9 <4.5 17.7 >5  Right Sup Peroneal Anti Sensory (Ant Lat Mall)  35C  12 cm    2.1 <4.5 12.2 >5  Left Sural Anti Sensory (Lat Mall)  35C  Calf    2.4 <4.5 17.8 >5  Right Sural Anti Sensory (Lat Mall)  35C  Calf    2.7 <4.5 18.9 >5   Motor Summary Table   Site NR Onset (ms) Norm Onset (ms) O-P Amp (mV) Norm O-P Amp Site1 Site2 Delta-0 (ms) Dist (cm) Vel (m/s) Norm Vel (m/s)  Left Peroneal Motor (Ext Dig Brev)  35C  Ankle    3.2 <5.5 5.9 >3 B Fib Ankle 5.7 33.0 58 >40  B Fib    8.9  5.8  Poplt B Fib 0.9 8.0 89 >40  Poplt    9.8  5.6           Right Peroneal Motor (Ext Dig Brev)  35C  Ankle    2.5 <5.5 4.2 >3 B Fib Ankle 6.6 37.0 56 >40  B Fib    9.1  3.8  Poplt B Fib 1.0 9.0 90 >40  Poplt    10.1  3.7         Left Tibial Motor (Abd Hall Brev)  35C  Ankle    2.7 <6.0 11.1 >8 Knee Ankle 7.5 38.0 51 >40  Knee    10.2  10.3         Right Tibial Motor (Abd Hall Brev)  35C  Ankle    0.9 <6.0 13.6 >8 Knee Ankle 1.9 38.0 200 >40  Knee    2.8  9.1         Site 3    10.0  7.7          H Reflex Studies   NR H-Lat (ms) Lat Norm (ms) L-R H-Lat (ms)  Left Tibial (Gastroc)  35C     27.76 <35 3.26  Right Tibial (Gastroc)  35C     31.02 <35 3.26   EMG   Side Muscle Ins Act Fibs Psw Fasc Number Recrt Dur Dur. Amp Amp. Poly Poly. Comment  Right AntTibialis Nml Nml  Nml Nml Nml Nml Nml Nml Nml Nml Nml Nml N/A  Right Gastroc Nml Nml Nml Nml Nml Nml Nml Nml Nml Nml Nml Nml N/A  Right Flex Dig Long Nml Nml Nml Nml Nml Nml Nml Nml Nml Nml Nml Nml N/A  Right RectFemoris Nml Nml Nml Nml Nml Nml Nml Nml Nml Nml Nml Nml N/A  Right GluteusMed Nml Nml Nml Nml Nml Nml Nml Nml Nml Nml Nml Nml N/A  Left AntTibialis Nml Nml Nml Nml Nml Nml Nml Nml Nml Nml Nml Nml N/A  Left Gastroc Nml Nml Nml Nml Nml Nml Nml Nml Nml Nml Nml Nml N/A  Left Flex Dig Long Nml Nml Nml Nml Nml Nml Nml Nml Nml Nml Nml Nml N/A  Left RectFemoris Nml Nml Nml Nml Nml Nml Nml Nml Nml Nml Nml Nml N/A  Left GluteusMed Nml Nml Nml Nml Nml Nml Nml Nml Nml Nml Nml Nml N/A      Waveforms:

## 2018-05-14 ENCOUNTER — Ambulatory Visit: Payer: Medicaid Other | Admitting: Neurology

## 2018-05-14 DIAGNOSIS — G5602 Carpal tunnel syndrome, left upper limb: Secondary | ICD-10-CM

## 2018-05-14 DIAGNOSIS — R202 Paresthesia of skin: Secondary | ICD-10-CM | POA: Diagnosis not present

## 2018-05-14 NOTE — Procedures (Signed)
[facility]  [address] [place], [address]  [place], [place] [address] Tel: [phone] Fax:  [phone] Test Date:  05/14/2018  Patient: Sarah English DOB: 06-Jan-1976 Physician: Nita Sickle, DO  Sex: Female Height: 5\' 6"  Ref Phys: Nita Sickle, DO  ID#: [id number] Temp: 32.9 C Technician:    Patient Complaints: This is a 42 year old female with history of carpal tunnel release on the right referred for evaluation of bilateral hand paresthesias.  NCV & EMG Findings: Extensive electrodiagnostic testing of the right upper extremity and additional studies of the left shows:  1. Left median sensory response shows prolonged distal peak latency (3.6 ms).  Right median and bilateral ulnar sensory responses are within normal limits.   2. Bilateral median and ulnar motor responses are within normal limits.   3. There is no evidence of active or chronic motor axonal loss changes affecting any of the tested muscles.  Motor unit configuration and recruitment pattern is within normal limits.    Impression: 1. Left median neuropathy at or distal to the wrist, consistent with a clinical diagnosis of carpal tunnel syndrome.  Overall, these findings are mild in degree electrically. 2. There is no evidence of a sensorimotor polyneuropathy or cervical radiculopathy affecting the upper extremities.   ___________________________ Nita Sickle, DO    Nerve Conduction Studies Anti Sensory Summary Table   Site NR Peak (ms) Norm Peak (ms) P-T Amp (V) Norm P-T Amp  Left Median Anti Sensory (2nd Digit)  Wrist    3.6 <3.4 40.0 >20  Right Median Anti Sensory (2nd Digit)  32.9C  Wrist    2.7 <3.4 48.9 >20  Left Ulnar Anti Sensory (5th Digit)  32.9C  Wrist    2.4 <3.1 37.2 >12  Right Ulnar Anti Sensory (5th Digit)  32.9C  Wrist    2.1 <3.1 38.5 >12   Motor Summary Table   Site NR Onset (ms) Norm Onset (ms) O-P Amp (mV) Norm O-P Amp Site1 Site2 Delta-0 (ms) Dist (cm) Vel (m/s) Norm Vel  (m/s)  Left Median Motor (Abd Poll Brev)  32.9C  Wrist    3.8 <3.9 10.0 >6 Elbow Wrist 4.3 26.0 60 >50  Elbow    8.1  10.0         Right Median Motor (Abd Poll Brev)  32.9C  Wrist    3.4 <3.9 10.8 >6 Elbow Wrist 3.9 27.0 69 >50  Elbow    7.3  9.0         Left Ulnar Motor (Abd Dig Minimi)  32.9C  Wrist    2.0 <3.1 12.4 >7 B Elbow Wrist 3.4 24.0 71 >50  B Elbow    5.4  12.1  A Elbow B Elbow 1.2 10.0 83 >50  A Elbow    6.6  11.4         Right Ulnar Motor (Abd Dig Minimi)  32.9C  Wrist    2.0 <3.1 12.4 >7 B Elbow Wrist 3.2 23.0 72 >50  B Elbow    5.2  12.2  A Elbow B Elbow 1.4 10.0 71 >50  A Elbow    6.6  11.8          EMG   Side Muscle Ins Act Fibs Psw Fasc Number Recrt Dur Dur. Amp Amp. Poly Poly. Comment  Left 1stDorInt Nml Nml Nml Nml Nml Nml Nml Nml Nml Nml Nml Nml N/A  Left Abd Poll Brev Nml Nml Nml Nml Nml Nml Nml Nml Nml Nml Nml Nml N/A  Left PronatorTeres Nml Nml Nml Nml Nml Nml Nml Nml Nml Nml Nml Nml N/A  Left Biceps Nml Nml Nml Nml Nml Nml Nml Nml Nml Nml Nml Nml N/A  Left Triceps Nml Nml Nml Nml Nml Nml Nml Nml Nml Nml Nml Nml N/A  Left Deltoid Nml Nml Nml Nml Nml Nml Nml Nml Nml Nml Nml Nml N/A  Right 1stDorInt Nml Nml Nml Nml Nml Nml Nml Nml Nml Nml Nml Nml N/A  Right PronatorTeres Nml Nml Nml Nml Nml Nml Nml Nml Nml Nml Nml Nml N/A  Right Biceps Nml Nml Nml Nml Nml Nml Nml Nml Nml Nml Nml Nml N/A  Right Triceps Nml Nml Nml Nml Nml Nml Nml Nml Nml Nml Nml Nml N/A  Right Deltoid Nml Nml Nml Nml Nml Nml Nml Nml Nml Nml Nml Nml N/A      Waveforms:

## 2018-05-16 ENCOUNTER — Encounter: Payer: Self-pay | Admitting: *Deleted

## 2018-05-16 ENCOUNTER — Telehealth: Payer: Self-pay | Admitting: *Deleted

## 2018-05-16 NOTE — Telephone Encounter (Signed)
Results sent via My Chart.  

## 2018-05-16 NOTE — Telephone Encounter (Signed)
-----   Message from Glendale Chard, DO sent at 05/15/2018 10:01 AM EDT ----- Please inform patient that her NCS/EMG of the arms shows carpal tunnel on the left hand, which is mild.  Otherwise, no signs of neuropathy or a pinched nerve in the arms or legs. No explanation for her numbness/tingling, but worrisome causes have been eliminated.  Start using a left wrist splint at bedtime, which will help her carpal tunnel.  Return to clinic as needed.

## 2018-05-21 ENCOUNTER — Encounter (INDEPENDENT_AMBULATORY_CARE_PROVIDER_SITE_OTHER): Payer: Medicaid Other

## 2018-05-21 ENCOUNTER — Telehealth (HOSPITAL_COMMUNITY): Payer: Self-pay | Admitting: Pharmacist

## 2018-05-22 NOTE — Telephone Encounter (Signed)
Cardiac Rehab Medication Review by a Pharmacist  Does the patient  feel that his/her medications are working for him/her?  yes  Has the patient been experiencing any side effects to the medications prescribed?  Yes  Does the patient measure his/her own blood pressure or blood glucose at home?  yes   Does the patient have any problems obtaining medications due to transportation or finances?   no  Understanding of regimen: good Understanding of indications: good Potential of compliance: good  Pharmacist comments: Patient expresses that she has experienced constipation and drowsiness while taking tramadol and Norco. She mentions she tries to limit her use of these medications because of these adverse effects.    Gwynneth Albright, Ilda Basset D PGY1 Pharmacy Resident  Phone [phone] 05/22/2018   4:31 PM

## 2018-05-28 ENCOUNTER — Encounter (HOSPITAL_COMMUNITY)
Admission: RE | Admit: 2018-05-28 | Discharge: 2018-05-28 | Disposition: A | Discharge status: Home / Self Care | Payer: Medicaid Other | Source: Ambulatory Visit | Attending: Cardiology | Admitting: Cardiology

## 2018-05-28 VITALS — BP 120/80 | HR 81 | Ht 65.5 in | Wt 301.6 lb

## 2018-05-28 DIAGNOSIS — I420 Dilated cardiomyopathy: Secondary | ICD-10-CM | POA: Diagnosis present

## 2018-05-28 DIAGNOSIS — I5022 Chronic systolic (congestive) heart failure: Secondary | ICD-10-CM

## 2018-05-28 NOTE — Progress Notes (Signed)
Cardiac Individual Treatment Plan  Patient Details  Name: Sarah English MRN: [id number] Date of Birth: 11/01/1975 Referring Provider:   Flowsheet Row CARDIAC REHAB PHASE II ORIENTATION from 05/28/2018 in [facility] CARDIAC REHAB  Referring Provider  Truett Mainland, MD      Initial Encounter Date:  Flowsheet Row CARDIAC REHAB PHASE II ORIENTATION from 05/28/2018 in [facility] CARDIAC REHAB  Date  05/28/18      Visit Diagnosis: 2019 Chronic systolic congestive heart failure (HCC)   Patient's Home Medications on Admission:  Current Outpatient Medications:  .  albuterol (PROVENTIL HFA;VENTOLIN HFA) 108 (90 Base) MCG/ACT inhaler, Inhale into the lungs every 6 (six) hours as needed for wheezing or shortness of breath., Disp: , Rfl:  .  Aloe-Sodium Chloride (AYR SALINE NASAL GEL NA), Place 1 spray into the nose daily., Disp: , Rfl:  .  antiseptic oral rinse (BIOTENE) LIQD, 1-2 application by Mouth Rinse route every hour as needed for dry mouth., Disp: , Rfl:  .  CAMILA 0.35 MG tablet, Take 1 tablet by mouth at bedtime. , Disp: , Rfl: 1 .  CORLANOR 5 MG TABS tablet, Take 5 mg by mouth 2 (two) times daily., Disp: , Rfl: 2 .  cyclobenzaprine (FLEXERIL) 10 MG tablet, Take 10 mg by mouth 3 (three) times daily as needed for muscle spasms., Disp: , Rfl:  .  cycloSPORINE (RESTASIS) 0.05 % ophthalmic emulsion, Place 1 drop into both eyes 2 (two) times daily. , Disp: , Rfl:  .  furosemide (LASIX) 20 MG tablet, Take 20 mg by mouth daily. , Disp: , Rfl:  .  gabapentin (NEURONTIN) 300 MG capsule, Take 1 capsule (300 mg total) by mouth 3 (three) times daily., Disp: 90 capsule, Rfl: 5 .  griseofulvin (GRIFULVIN V) 500 MG tablet, Take 1 tablet by mouth daily. For 4 to 6 weeks, Disp: , Rfl: 1 .  HYDROcodone-acetaminophen (NORCO) 5-325 MG tablet, Take 1 tablet by mouth every 6 (six) hours as needed for severe pain., Disp: 30 tablet, Rfl: 0 .  Lactobacillus Rhamnosus, GG,  (CULTURELLE PROBIOTICS KIDS PO), Take 1 tablet by mouth daily., Disp: , Rfl:  .  metoprolol succinate (TOPROL-XL) 25 MG 24 hr tablet, Take 25 mg by mouth daily., Disp: , Rfl: 2 .  omeprazole (PRILOSEC) 20 MG capsule, Take 20 mg by mouth daily., Disp: , Rfl:  .  sacubitril-valsartan (ENTRESTO) 49-51 MG, Take 1 tablet by mouth 2 (two) times daily., Disp: , Rfl:  .  selenium (SELENIMIN-200) 200 MCG TABS tablet, Take 200 mcg by mouth daily. , Disp: , Rfl:  .  silver sulfADIAZINE (SILVADENE) 1 % cream, Apply 1 application topically daily as needed (PAIN)., Disp: , Rfl:  .  sodium chloride (AYR) 0.65 % nasal spray, Place 1 spray into the nose daily., Disp: , Rfl:  .  spironolactone (ALDACTONE) 25 MG tablet, Take 12.5 mg by mouth daily., Disp: , Rfl:  .  SUMAtriptan (IMITREX) 100 MG tablet, Take 100 mg by mouth every 6 (six) hours as needed for migraine. May repeat in 2 hours if headache persists or recurs., Disp: , Rfl:  .  traMADol (ULTRAM) 50 MG tablet, Take 50 mg by mouth 2 (two) times daily as needed for moderate pain. , Disp: , Rfl:   Past Medical History: Past Medical History:  Diagnosis Date  . Anemia   . Autoimmune disorder (HCC)   . Bronchitis    hx -uses inhaler prn  . Chronic kidney disease  kidney infections  . Endometriosis   . Fibromyalgia   . GERD (gastroesophageal reflux disease)    occasional  . Hashimoto thyroiditis, fibrous variant   . Hashimoto's disease   . Headache    migraines  . History of kidney stones   . Hypertension   . IVCD (intraventricular conduction defect)   . LBBB (left bundle branch block)   . Nonischemic cardiomyopathy (HCC)    per stress test 12-17-2017  ef 14%;  per echo 12-13-2017 ef 21%  . Peripheral vascular disease (HCC)    raynoids syndrome  . Pneumonia    only x 1 - 2- 3 months ago  . 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 Scleroderma (HCC)   . Sjogren's syndrome (HCC)   . Sleep apnea     recently dx 04/13/18, does not have CPAP machine yet.  . Systemic sclerosis (HCC)   . Systolic CHF with reduced left ventricular function, NYHA class 3 [facility])    cardiologist-  dr Evlyn Clines patwardhan  . Tendinitis    lower back  . Thyroiditis     Tobacco Use: Social History   Tobacco Use  Smoking Status Former Smoker  . Packs/day: 0.50  . Years: 8.00  . Pack years: 4.00  . Types: Cigarettes  . Last attempt to quit: 12/10/2012  . Years since quitting: 5.4  Smokeless Tobacco Never Used    Labs: Recent Review Advice worker    Labs for ITP Cardiac and Pulmonary Rehab Latest Ref Rng & Units 12/18/2017 12/18/2017   PHART 7.350 - 7.450 - 7.365   PCO2ART 32.0 - 48.0 mmHg - 42.9   HCO3 20.0 - 28.0 mmol/L 25.9 24.5   TCO2 22 - 32 mmol/L 27 26   ACIDBASEDEF 0.0 - 2.0 mmol/L - 1.0   O2SAT % 69.0 99.0      Capillary Blood Glucose: No results found for: GLUCAP   Exercise Target Goals: Exercise Program Goal: Individual exercise prescription set using results from initial 6 min walk test and THRR while considering  patient's activity barriers and safety.   Exercise Prescription Goal: Initial exercise prescription builds to 30-45 minutes a day of aerobic activity, 2-3 days per week.  Home exercise guidelines will be given to patient during program as part of exercise prescription that the participant will acknowledge.  Activity Barriers & Risk Stratification: Activity Barriers & Cardiac Risk Stratification - 05/28/18 0857    Activity Barriers & Cardiac Risk Stratification          Activity Barriers  Other (comment)    Comments  Autoimmune disease-right side gets weak. Joint pain    Cardiac Risk Stratification  High           6 Minute Walk: 6 Minute Walk    6 Minute Walk    Row Name 05/28/18 0950   Phase  Initial   Distance  1487 feet   Walk Time  6 minutes   # of Rest Breaks  0   MPH  2.82   METS  3.55   RPE  12   Perceived Dyspnea   0   VO2 Peak  12.41   Symptoms   Yes (comment)   Comments  Patient c/o bilateral ankle and foot pain/neuropathy, "pins and needles". Ankle pain "2-3/10" on the pain scale.   Resting HR  81 bpm   Resting BP  120/80   Resting Oxygen Saturation   98 %  Exercise Oxygen Saturation  during 6 min walk  99 %   Max Ex. HR  119 bpm   Max Ex. BP  120/70   2 Minute Post BP  104/74          Oxygen Initial Assessment:   Oxygen Re-Evaluation:   Oxygen Discharge (Final Oxygen Re-Evaluation):   Initial Exercise Prescription: Initial Exercise Prescription - 05/28/18 1000    Date of Initial Exercise RX and Referring Provider          Date  05/28/18    Referring Provider  Truett Mainland, MD    Expected Discharge Date  08/19/18        Recumbant Bike          Level  2    Minutes  10    METs  2.5        NuStep          Level  3    SPM  85    Minutes  10    METs  2.5        Track          Laps  12    Minutes  10    METs  3.09        Prescription Details          Frequency (times per week)  3    Duration  Progress to 30 minutes of continuous aerobic without signs/symptoms of physical distress        Intensity          THRR 40-80% of Max Heartrate  71-142    Ratings of Perceived Exertion  11-13    Perceived Dyspnea  0-4        Progression          Progression  Continue to progress workloads to maintain intensity without signs/symptoms of physical distress.        Resistance Training          Training Prescription  Yes    Weight  3lbs    Reps  10-15           Perform Capillary Blood Glucose checks as needed.  Exercise Prescription Changes:   Exercise Comments:   Exercise Goals and Review: Exercise Goals    Exercise Goals    Row Name 05/28/18 0857   Increase Physical Activity  Yes   Intervention  Provide advice, education, support and counseling about physical activity/exercise needs.;Develop an individualized exercise prescription for aerobic and resistive training based on  initial evaluation findings, risk stratification, comorbidities and participant's personal goals.   Expected Outcomes  Short Term: Attend rehab on a regular basis to increase amount of physical activity.;Long Term: Exercising regularly at least 3-5 days a week.;Long Term: Add in home exercise to make exercise part of routine and to increase amount of physical activity.   Increase Strength and Stamina  Yes   Intervention  Provide advice, education, support and counseling about physical activity/exercise needs.;Develop an individualized exercise prescription for aerobic and resistive training based on initial evaluation findings, risk stratification, comorbidities and participant's personal goals.   Expected Outcomes  Short Term: Increase workloads from initial exercise prescription for resistance, speed, and METs.;Short Term: Perform resistance training exercises routinely during rehab and add in resistance training at home;Long Term: Improve cardiorespiratory fitness, muscular endurance and strength as measured by increased METs and functional capacity ( )   Able to understand and use rate of perceived exertion (RPE) scale  Yes  Intervention  Provide education and explanation on how to use RPE scale   Expected Outcomes  Short Term: Able to use RPE daily in rehab to express subjective intensity level;Long Term:  Able to use RPE to guide intensity level when exercising independently   Knowledge and understanding of Target Heart Rate Range (THRR)  Yes   Intervention  Provide education and explanation of THRR including how the numbers were predicted and where they are located for reference   Expected Outcomes  Short Term: Able to state/look up THRR;Long Term: Able to use THRR to govern intensity when exercising independently;Short Term: Able to use daily as guideline for intensity in rehab   Able to check pulse independently  Yes   Intervention  Provide education and demonstration on how to check pulse  in carotid and radial arteries.;Review the importance of being able to check your own pulse for safety during independent exercise   Expected Outcomes  Short Term: Able to explain why pulse checking is important during independent exercise;Long Term: Able to check pulse independently and accurately   Understanding of Exercise Prescription  Yes   Intervention  Provide education, explanation, and written materials on patient's individual exercise prescription   Expected Outcomes  Short Term: Able to explain program exercise prescription;Long Term: Able to explain home exercise prescription to exercise independently          Exercise Goals Re-Evaluation :   Discharge Exercise Prescription (Final Exercise Prescription Changes):   Nutrition:  Target Goals: Understanding of nutrition guidelines, daily intake of sodium 1500mg , cholesterol 200mg , calories 30% from fat and 7% or less from saturated fats, daily to have 5 or more servings of fruits and vegetables.  Biometrics: Pre Biometrics - 05/28/18 0850    Pre Biometrics          Height  5' 5.5" (1.664 m)    Weight  (Abnormal) 136.8 kg    Waist Circumference  48.5 inches    Hip Circumference  57.75 inches    Waist to Hip Ratio  0.84 %    BMI (Calculated)  49.41    Triceps Skinfold  61 mm    % Body Fat  57.7 %    Grip Strength  33.5 kg    Flexibility  12.5 in    Single Leg Stand  30 seconds            Nutrition Therapy Plan and Nutrition Goals:   Nutrition Assessments:   Nutrition Goals Re-Evaluation:   Nutrition Goals Re-Evaluation:   Nutrition Goals Discharge (Final Nutrition Goals Re-Evaluation):   Psychosocial: Target Goals: Acknowledge presence or absence of significant depression and/or stress, maximize coping skills, provide positive support system. Participant is able to verbalize types and ability to use techniques and skills needed for reducing stress and depression.  Initial Review & Psychosocial  Screening: Initial Psych Review & Screening - 05/28/18 1022    Initial Review          Current issues with  None Identified        Family Dynamics          Good Support System?  Yes   parents, son, best friend, support group       Barriers          Psychosocial barriers to participate in program  There are no identifiable barriers or psychosocial needs.        Screening Interventions          Interventions  Encouraged to exercise  Quality of Life Scores: Quality of Life - 05/28/18 1022    Quality of Life          Select  Quality of Life        Quality of Life Scores          Health/Function Pre  14.5 %    Socioeconomic Pre  16.8 %    Psych/Spiritual Pre  19.6 %    Family Pre  24.1 %    GLOBAL Pre  17.4 %          Scores of 19 and below usually indicate a poorer quality of life in these areas.  A difference of  2-3 points is a clinically meaningful difference.  A difference of 2-3 points in the total score of the Quality of Life Index has been associated with significant improvement in overall quality of life, self-image, physical symptoms, and general health in studies assessing change in quality of life.  PHQ-9: Recent Review Flowsheet Data    There is no flowsheet data to display.     Interpretation of Total Score  Total Score Depression Severity:  1-4 = Minimal depression, 5-9 = Mild depression, 10-14 = Moderate depression, 15-19 = Moderately severe depression, 20-27 = Severe depression   Psychosocial Evaluation and Intervention:   Psychosocial Re-Evaluation:   Psychosocial Discharge (Final Psychosocial Re-Evaluation):   Vocational Rehabilitation: Provide vocational rehab assistance to qualifying candidates.   Vocational Rehab Evaluation & Intervention: Vocational Rehab - 05/28/18 1132    Initial Vocational Rehab Evaluation & Intervention          Assessment shows need for Vocational Rehabilitation  Yes    Vocational Rehab Packet  given to patient  05/28/18           Education: Education Goals: Education classes will be provided on a weekly basis, covering required topics. Participant will state understanding/return demonstration of topics presented.  Learning Barriers/Preferences: Learning Barriers/Preferences - 05/27/18 1552    Learning Barriers/Preferences          Learning Barriers  Sight    Learning Preferences  Written Material;Verbal Instruction;Video;Group Instruction           Education Topics: Count Your Pulse:  -Group instruction provided by verbal instruction, demonstration, patient participation and written materials to support subject.  Instructors address importance of being able to find your pulse and how to count your pulse when at home without a heart monitor.  Patients get hands on experience counting their pulse with staff help and individually.   Heart Attack, Angina, and Risk Factor Modification:  -Group instruction provided by verbal instruction, video, and written materials to support subject.  Instructors address signs and symptoms of angina and heart attacks.    Also discuss risk factors for heart disease and how to make changes to improve heart health risk factors.   Functional Fitness:  -Group instruction provided by verbal instruction, demonstration, patient participation, and written materials to support subject.  Instructors address safety measures for doing things around the house.  Discuss how to get up and down off the floor, how to pick things up properly, how to safely get out of a chair without assistance, and balance training.   Meditation and Mindfulness:  -Group instruction provided by verbal instruction, patient participation, and written materials to support subject.  Instructor addresses importance of mindfulness and meditation practice to help reduce stress and improve awareness.  Instructor also leads participants through a meditation exercise.    Stretching for  Flexibility and  Mobility:  -Group instruction provided by verbal instruction, patient participation, and written materials to support subject.  Instructors lead participants through series of stretches that are designed to increase flexibility thus improving mobility.  These stretches are additional exercise for major muscle groups that are typically performed during regular warm up and cool down.   Hands Only CPR:  -Group verbal, video, and participation provides a basic overview of AHA guidelines for community CPR. Role-play of emergencies allow participants the opportunity to practice calling for help and chest compression technique with discussion of AED use.   Hypertension: -Group verbal and written instruction that provides a basic overview of hypertension including the most recent diagnostic guidelines, risk factor reduction with self-care instructions and medication management.    Nutrition I class: Heart Healthy Eating:  -Group instruction provided by PowerPoint slides, verbal discussion, and written materials to support subject matter. The instructor gives an explanation and review of the Therapeutic Lifestyle Changes diet recommendations, which includes a discussion on lipid goals, dietary fat, sodium, fiber, plant stanol/sterol esters, sugar, and the components of a well-balanced, healthy diet.   Nutrition II class: Lifestyle Skills:  -Group instruction provided by PowerPoint slides, verbal discussion, and written materials to support subject matter. The instructor gives an explanation and review of label reading, grocery shopping for heart health, heart healthy recipe modifications, and ways to make healthier choices when eating out.   Diabetes Question & Answer:  -Group instruction provided by PowerPoint slides, verbal discussion, and written materials to support subject matter. The instructor gives an explanation and review of diabetes co-morbidities, pre- and post-prandial blood  glucose goals, pre-exercise blood glucose goals, signs, symptoms, and treatment of hypoglycemia and hyperglycemia, and foot care basics.   Diabetes Blitz:  -Group instruction provided by PowerPoint slides, verbal discussion, and written materials to support subject matter. The instructor gives an explanation and review of the physiology behind type 1 and type 2 diabetes, diabetes medications and rational behind using different medications, pre- and post-prandial blood glucose recommendations and Hemoglobin A1c goals, diabetes diet, and exercise including blood glucose guidelines for exercising safely.    Portion Distortion:  -Group instruction provided by PowerPoint slides, verbal discussion, written materials, and food models to support subject matter. The instructor gives an explanation of serving size versus portion size, changes in portions sizes over the last 20 years, and what consists of a serving from each food group.   Stress Management:  -Group instruction provided by verbal instruction, video, and written materials to support subject matter.  Instructors review role of stress in heart disease and how to cope with stress positively.     Exercising on Your Own:  -Group instruction provided by verbal instruction, power point, and written materials to support subject.  Instructors discuss benefits of exercise, components of exercise, frequency and intensity of exercise, and end points for exercise.  Also discuss use of nitroglycerin and activating EMS.  Review options of places to exercise outside of rehab.  Review guidelines for sex with heart disease.   Cardiac Drugs I:  -Group instruction provided by verbal instruction and written materials to support subject.  Instructor reviews cardiac drug classes: antiplatelets, anticoagulants, beta blockers, and statins.  Instructor discusses reasons, side effects, and lifestyle considerations for each drug class.   Cardiac Drugs II:  -Group  instruction provided by verbal instruction and written materials to support subject.  Instructor reviews cardiac drug classes: angiotensin converting enzyme inhibitors (ACE-I), angiotensin II receptor blockers (ARBs), nitrates, and calcium channel blockers.  Instructor discusses reasons, side effects, and lifestyle considerations for each drug class.   Anatomy and Physiology of the Circulatory System:  Group verbal and written instruction and models provide basic cardiac anatomy and physiology, with the coronary electrical and arterial systems. Review of: AMI, Angina, Valve disease, Heart Failure, Peripheral Artery Disease, Cardiac Arrhythmia, Pacemakers, and the ICD.   Other Education:  -Group or individual verbal, written, or video instructions that support the educational goals of the cardiac rehab program.   Holiday Eating Survival Tips:  -Group instruction provided by PowerPoint slides, verbal discussion, and written materials to support subject matter. The instructor gives patients tips, tricks, and techniques to help them not only survive but enjoy the holidays despite the onslaught of food that accompanies the holidays.   Knowledge Questionnaire Score: Knowledge Questionnaire Score - 05/28/18 1023    Knowledge Questionnaire Score          Pre Score  24/24           Core Components/Risk Factors/Patient Goals at Admission: Personal Goals and Risk Factors at Admission - 05/28/18 1121    Core Components/Risk Factors/Patient Goals on Admission           Weight Management  Yes;Obesity    Intervention  Weight Management/Obesity: Establish reasonable short term and long term weight goals.;Obesity: Provide education and appropriate resources to help participant work on and attain dietary goals.    Admit Weight  301 lb 9.4 oz (136.8 kg)    Expected Outcomes  Short Term: Continue to assess and modify interventions until short term weight is achieved;Long Term: Adherence to nutrition and  physical activity/exercise program aimed toward attainment of established weight goal;Weight Loss: Understanding of general recommendations for a balanced deficit meal plan, which promotes 1-2 lb weight loss per week and includes a negative energy balance of [phone] kcal/d;Understanding recommendations for meals to include 15-35% energy as protein, 25-35% energy from fat, 35-60% energy from carbohydrates, less than 200mg  of dietary cholesterol, 20-35 gm of total fiber daily;Understanding of distribution of calorie intake throughout the day with the consumption of 4-5 meals/snacks    Hypertension  Yes    Intervention  Provide education on lifestyle modifcations including regular physical activity/exercise, weight management, moderate sodium restriction and increased consumption of fresh fruit, vegetables, and low fat dairy, alcohol moderation, and smoking cessation.;Monitor prescription use compliance.    Expected Outcomes  Short Term: Continued assessment and intervention until BP is < 140/7mm HG in hypertensive participants. < 130/71mm HG in hypertensive participants with diabetes, heart failure or chronic kidney disease.;Long Term: Maintenance of blood pressure at goal levels.    Stress  Yes    Intervention  Offer individual and/or small group education and counseling on adjustment to heart disease, stress management and health-related lifestyle change. Teach and support self-help strategies.;Refer participants experiencing significant psychosocial distress to appropriate mental health specialists for further evaluation and treatment. When possible, include family members and significant others in education/counseling sessions.    Expected Outcomes  Short Term: Participant demonstrates changes in health-related behavior, relaxation and other stress management skills, ability to obtain effective social support, and compliance with psychotropic medications if prescribed.;Long Term: Emotional wellbeing is  indicated by absence of clinically significant psychosocial distress or social isolation.           Core Components/Risk Factors/Patient Goals Review:    Core Components/Risk Factors/Patient Goals at Discharge (Final Review):    ITP Comments: ITP Comments    Row Name 05/27/18 1549   ITP Comments  Dr. Armanda Magic, Medical Director       Comments: Patient attended orientation from [phone] to 1013  to review rules and guidelines for program. Completed 6 minute walk test, Intitial ITP, and exercise prescription.  VSS. Telemetry-sinus rhythm,   Asymptomatic.   Deveron Furlong, RN, BSN Cardiac Pulmonary Rehab  05/28/18 11:39 AM

## 2018-06-03 ENCOUNTER — Encounter (HOSPITAL_COMMUNITY): Payer: Medicaid Other

## 2018-06-03 ENCOUNTER — Encounter (HOSPITAL_COMMUNITY)
Admission: RE | Admit: 2018-06-03 | Discharge: 2018-06-03 | Disposition: A | Discharge status: Home / Self Care | Payer: Medicaid Other | Source: Ambulatory Visit | Attending: Cardiology | Admitting: Cardiology

## 2018-06-03 DIAGNOSIS — I420 Dilated cardiomyopathy: Secondary | ICD-10-CM | POA: Diagnosis not present

## 2018-06-03 DIAGNOSIS — I5022 Chronic systolic (congestive) heart failure: Secondary | ICD-10-CM

## 2018-06-03 NOTE — Progress Notes (Signed)
Sarah English 42 y.o. female DOB 1976/05/08 MRN [id number]       Nutrition  1. 2019 Chronic systolic congestive heart failure [facility])     Past Medical History:  Diagnosis Date  . Anemia   . Autoimmune disorder (HCC)   . Bronchitis    hx -uses inhaler prn  . Chronic kidney disease    kidney infections  . Endometriosis   . Fibromyalgia   . GERD (gastroesophageal reflux disease)    occasional  . Hashimoto thyroiditis, fibrous variant   . Hashimoto's disease   . Headache    migraines  . History of kidney stones   . Hypertension   . IVCD (intraventricular conduction defect)   . LBBB (left bundle branch block)   . Nonischemic cardiomyopathy (HCC)    per stress test 12-17-2017  ef 14%;  per echo 12-13-2017 ef 21%  . Peripheral vascular disease (HCC)    raynoids syndrome  . Pneumonia    only x 1 - 2- 3 months ago  . 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 Scleroderma (HCC)   . Sjogren's syndrome (HCC)   . Sleep apnea    recently dx 04/13/18, does not have CPAP machine yet.  . Systemic sclerosis (HCC)   . Systolic CHF with reduced left ventricular function, NYHA class 3 [facility])    cardiologist-  dr Evlyn Clines patwardhan  . Tendinitis    lower back  . Thyroiditis    Meds reviewed.    Current Outpatient Medications (Endocrine & Metabolic):  Marland Kitchen  CAMILA 0.35 MG tablet, Take 1 tablet by mouth at bedtime.   Current Outpatient Medications (Cardiovascular):  Marland Kitchen  CORLANOR 5 MG TABS tablet, Take 5 mg by mouth 2 (two) times daily. .  furosemide (LASIX) 20 MG tablet, Take 20 mg by mouth daily.  .  metoprolol succinate (TOPROL-XL) 25 MG 24 hr tablet, Take 25 mg by mouth daily. .  sacubitril-valsartan (ENTRESTO) 49-51 MG, Take 1 tablet by mouth 2 (two) times daily. Marland Kitchen  spironolactone (ALDACTONE) 25 MG tablet, Take 12.5 mg by mouth daily.  Current Outpatient Medications (Respiratory):  .  albuterol (PROVENTIL HFA;VENTOLIN HFA) 108 (90 Base)  MCG/ACT inhaler, Inhale into the lungs every 6 (six) hours as needed for wheezing or shortness of breath. .  Aloe-Sodium Chloride (AYR SALINE NASAL GEL NA), Place 1 spray into the nose daily. .  sodium chloride (AYR) 0.65 % nasal spray, Place 1 spray into the nose daily.  Current Outpatient Medications (Analgesics):  .  HYDROcodone-acetaminophen (NORCO) 5-325 MG tablet, Take 1 tablet by mouth every 6 (six) hours as needed for severe pain. .  SUMAtriptan (IMITREX) 100 MG tablet, Take 100 mg by mouth every 6 (six) hours as needed for migraine. May repeat in 2 hours if headache persists or recurs. .  traMADol (ULTRAM) 50 MG tablet, Take 50 mg by mouth 2 (two) times daily as needed for moderate pain.    Current Outpatient Medications (Other):  .  antiseptic oral rinse (BIOTENE) LIQD, 1-2 application by Mouth Rinse route every hour as needed for dry mouth. .  cyclobenzaprine (FLEXERIL) 10 MG tablet, Take 10 mg by mouth 3 (three) times daily as needed for muscle spasms. .  cycloSPORINE (RESTASIS) 0.05 % ophthalmic emulsion, Place 1 drop into both eyes 2 (two) times daily.  Marland Kitchen  gabapentin (NEURONTIN) 300 MG capsule, Take 1 capsule (300 mg total) by mouth 3 (three) times daily. Marland Kitchen  griseofulvin (  GRIFULVIN V) 500 MG tablet, Take 1 tablet by mouth daily. For 4 to 6 weeks .  Lactobacillus Rhamnosus, GG, (CULTURELLE PROBIOTICS KIDS PO), Take 1 tablet by mouth daily. Marland Kitchen  omeprazole (PRILOSEC) 20 MG capsule, Take 20 mg by mouth daily. Marland Kitchen  selenium (SELENIMIN-200) 200 MCG TABS tablet, Take 200 mcg by mouth daily.  .  silver sulfADIAZINE (SILVADENE) 1 % cream, Apply 1 application topically daily as needed (PAIN).   HT: Ht Readings from Last 1 Encounters:  05/28/18 5' 5.5" (1.664 m)    WT: Wt Readings from Last 5 Encounters:  05/28/18 (!) 301 lb 9.4 oz (136.8 kg)  04/26/18 300 lb (136.1 kg)  04/09/18 296 lb 1.2 oz (134.3 kg)  04/12/18 297 lb 6 oz (134.9 kg)  03/20/18 296 lb (134.3 kg)     Body mass  index is 49.42 kg/m.   Current tobacco use? No       Labs:  Lipid Panel  No results found for: CHOL, TRIG, HDL, CHOLHDL, VLDL, LDLCALC, LDLDIRECT  No results found for: HGBA1C CBG (last 3)  No results for input(s): GLUCAP in the last 72 hours.  Nutrition Diagnosis ? Food-and nutrition-related knowledge deficit related to lack of exposure to information as related to diagnosis of: ? CVD ? CHF  Nutrition Goal(s):  ? To be determined  Plan:  Pt to attend nutrition classes ? Nutrition I ? Nutrition II ? Portion Distortion Will provide client-centered nutrition education as part of interdisciplinary care.   Monitor and evaluate progress toward nutrition goal with team.  Ross Marcus, MS, RD, LDN 06/03/2018 11:45 AM

## 2018-06-03 NOTE — Progress Notes (Signed)
Daily Session Note  Patient Details  Name: Sarah English MRN: [id number] Date of Birth: [birth year] Referring Provider:     CARDIAC REHAB PHASE II ORIENTATION from 05/28/2018 in [facility] [place]  Referring Provider  Sarah Leep, MD      Encounter Date: 06/03/2018  Check In: Session Check In - 06/03/18 1530      Check-In   Supervising physician immediately available to respond to emergencies  [facility] immediately available    Physician(s)  Dr. Herbert English    Location  [facility]    Staff Present  Sarah Carol, MS, ACSM CEP, Exercise Physiologist;Sarah Vanlue Venetia Maxon, RN, Sarah Deiters, RN, BSN    Medication changes reported      No    Fall or balance concerns reported     No    Tobacco Cessation  No Change    Warm-up and Cool-down  Performed as group-led instruction    Resistance Training Performed  Yes    VAD Patient?  No    PAD/SET Patient?  No      Pain Assessment   Currently in Pain?  No/denies       Capillary Blood Glucose: No results found for this or any previous visit (from the past 24 hour(s)).    Social History   Tobacco Use  Smoking Status Former Smoker  . Packs/day: 0.50  . Years: 8.00  . Pack years: 4.00  . Types: Cigarettes  . Last attempt to quit: 12/10/2012  . Years since quitting: 5.4  Smokeless Tobacco Never Used    Goals Met:  Exercise tolerated well  Goals Unmet:  Not Applicable  Comments: Sarah English started cardiac rehab today.  Pt tolerated light exercise without difficulty. VSS, telemetry-Sinus Rhythm, asymptomatic.  Medication list reconciled. Pt denies barriers to medicaiton compliance.  PSYCHOSOCIAL ASSESSMENT:  PHQ-0. Pt exhibits positive coping skills, hopeful outlook with supportive family. No psychosocial needs identified at this time, no psychosocial interventions necessary.    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   Pt oriented to exercise equipment and routine. Sarah English  turned in vocational rehab questionnaire and medical release form. Sarah English would like to find a part time job.   Understanding verbalized.Sarah Pall, RN,BSN 06/03/2018 4:23 PM   Dr. Fransico English is Medical Director for Cardiac Rehab at [facility].

## 2018-06-05 ENCOUNTER — Encounter (HOSPITAL_COMMUNITY)
Admission: RE | Admit: 2018-06-05 | Discharge: 2018-06-05 | Disposition: A | Discharge status: Home / Self Care | Payer: Medicaid Other | Source: Ambulatory Visit | Attending: Cardiology | Admitting: Cardiology

## 2018-06-05 ENCOUNTER — Encounter (HOSPITAL_COMMUNITY): Payer: Medicaid Other

## 2018-06-05 DIAGNOSIS — I5022 Chronic systolic (congestive) heart failure: Secondary | ICD-10-CM

## 2018-06-05 DIAGNOSIS — I420 Dilated cardiomyopathy: Secondary | ICD-10-CM | POA: Diagnosis not present

## 2018-06-06 ENCOUNTER — Encounter (INDEPENDENT_AMBULATORY_CARE_PROVIDER_SITE_OTHER): Payer: Self-pay

## 2018-06-06 ENCOUNTER — Encounter (INDEPENDENT_AMBULATORY_CARE_PROVIDER_SITE_OTHER): Payer: Self-pay | Admitting: Bariatrics

## 2018-06-06 ENCOUNTER — Ambulatory Visit (INDEPENDENT_AMBULATORY_CARE_PROVIDER_SITE_OTHER): Payer: Medicaid Other | Admitting: Bariatrics

## 2018-06-07 ENCOUNTER — Encounter (HOSPITAL_COMMUNITY): Payer: Medicaid Other

## 2018-06-07 ENCOUNTER — Encounter (HOSPITAL_COMMUNITY)
Admission: RE | Admit: 2018-06-07 | Discharge: 2018-06-07 | Disposition: A | Discharge status: Home / Self Care | Payer: Medicaid Other | Source: Ambulatory Visit | Attending: Cardiology | Admitting: Cardiology

## 2018-06-07 DIAGNOSIS — I420 Dilated cardiomyopathy: Secondary | ICD-10-CM | POA: Diagnosis not present

## 2018-06-07 DIAGNOSIS — I5022 Chronic systolic (congestive) heart failure: Secondary | ICD-10-CM

## 2018-06-10 ENCOUNTER — Telehealth (HOSPITAL_COMMUNITY): Payer: Self-pay | Admitting: Family Medicine

## 2018-06-10 ENCOUNTER — Encounter (HOSPITAL_COMMUNITY): Payer: Medicaid Other

## 2018-06-12 ENCOUNTER — Encounter (HOSPITAL_COMMUNITY): Payer: Medicaid Other

## 2018-06-12 ENCOUNTER — Encounter (HOSPITAL_COMMUNITY)
Admission: RE | Admit: 2018-06-12 | Discharge: 2018-06-12 | Disposition: A | Discharge status: Home / Self Care | Payer: Medicaid Other | Source: Ambulatory Visit | Attending: Cardiology | Admitting: Cardiology

## 2018-06-12 DIAGNOSIS — I5022 Chronic systolic (congestive) heart failure: Secondary | ICD-10-CM

## 2018-06-12 DIAGNOSIS — I420 Dilated cardiomyopathy: Secondary | ICD-10-CM | POA: Diagnosis not present

## 2018-06-14 ENCOUNTER — Encounter (HOSPITAL_COMMUNITY): Payer: Medicaid Other

## 2018-06-14 ENCOUNTER — Encounter (HOSPITAL_COMMUNITY)
Admission: RE | Admit: 2018-06-14 | Discharge: 2018-06-14 | Disposition: A | Discharge status: Home / Self Care | Payer: Medicaid Other | Source: Ambulatory Visit | Attending: Cardiology | Admitting: Cardiology

## 2018-06-14 DIAGNOSIS — I5022 Chronic systolic (congestive) heart failure: Secondary | ICD-10-CM

## 2018-06-14 DIAGNOSIS — I420 Dilated cardiomyopathy: Secondary | ICD-10-CM | POA: Diagnosis not present

## 2018-06-14 NOTE — Progress Notes (Signed)
Reviewed home exercise guidelines with patient including endpoints, temperature precautions, target heart rate and rate of perceived exertion. Pt does yoga 20 minutes daily and is walking 2 miles ~30 minutes, 4 days/week as her mode of home exercise. Walking is limited due to joint, feet, and ankle soreness from autoimmune disease. Pt voices understanding of instructions given. Artist Pais, MS, ACSM CEP

## 2018-06-17 ENCOUNTER — Encounter (HOSPITAL_COMMUNITY)
Admission: RE | Admit: 2018-06-17 | Discharge: 2018-06-17 | Disposition: A | Discharge status: Home / Self Care | Payer: Medicaid Other | Source: Ambulatory Visit | Attending: Cardiology | Admitting: Cardiology

## 2018-06-17 ENCOUNTER — Encounter (HOSPITAL_COMMUNITY): Payer: Medicaid Other

## 2018-06-17 DIAGNOSIS — I420 Dilated cardiomyopathy: Secondary | ICD-10-CM | POA: Diagnosis not present

## 2018-06-17 DIAGNOSIS — I5022 Chronic systolic (congestive) heart failure: Secondary | ICD-10-CM

## 2018-06-19 ENCOUNTER — Encounter (HOSPITAL_COMMUNITY)
Admission: RE | Admit: 2018-06-19 | Discharge: 2018-06-19 | Disposition: A | Discharge status: Home / Self Care | Payer: Medicaid Other | Source: Ambulatory Visit | Attending: Cardiology | Admitting: Cardiology

## 2018-06-19 ENCOUNTER — Encounter (HOSPITAL_COMMUNITY): Payer: Medicaid Other

## 2018-06-19 DIAGNOSIS — I420 Dilated cardiomyopathy: Secondary | ICD-10-CM | POA: Diagnosis not present

## 2018-06-19 DIAGNOSIS — I5022 Chronic systolic (congestive) heart failure: Secondary | ICD-10-CM

## 2018-06-20 NOTE — Progress Notes (Signed)
Cardiac Individual Treatment Plan  Patient Details  Name: Sarah English MRN: [id number] Date of Birth: [birth year] Referring Provider:     CARDIAC REHAB PHASE II ORIENTATION from 05/28/2018 in [facility] CARDIAC REHAB  Referring Provider  Truett Mainland, MD      Initial Encounter Date:    CARDIAC REHAB PHASE II ORIENTATION from 05/28/2018 in [facility] CARDIAC REH[facility]  Date  05/28/18      Visit Diagnosis: 2019 Chronic systolic congestive heart failure (HCC)   Patient's Home Medications on Admission:  Current Outpatient Medications:  .  albuterol (PROVENTIL HFA;VENTOLIN HFA) 108 (90 Base) MCG/ACT inhaler, Inhale into the lungs every 6 (six) hours as needed for wheezing or shortness of breath., Disp: , Rfl:  .  Aloe-Sodium Chloride (AYR SALINE NASAL GEL NA), Place 1 spray into the nose daily., Disp: , Rfl:  .  antiseptic oral rinse (BIOTENE) LIQD, 1-2 application by Mouth Rinse route every hour as needed for dry mouth., Disp: , Rfl:  .  CAMILA 0.35 MG tablet, Take 1 tablet by mouth at bedtime. , Disp: , Rfl: 1 .  CORLANOR 5 MG TABS tablet, Take 5 mg by mouth 2 (two) times daily., Disp: , Rfl: 2 .  cyclobenzaprine (FLEXERIL) 10 MG tablet, Take 10 mg by mouth 3 (three) times daily as needed for muscle spasms., Disp: , Rfl:  .  cycloSPORINE (RESTASIS) 0.05 % ophthalmic emulsion, Place 1 drop into both eyes 2 (two) times daily. , Disp: , Rfl:  .  furosemide (LASIX) 20 MG tablet, Take 20 mg by mouth daily. , Disp: , Rfl:  .  gabapentin (NEURONTIN) 300 MG capsule, Take 1 capsule (300 mg total) by mouth 3 (three) times daily., Disp: 90 capsule, Rfl: 5 .  griseofulvin (GRIFULVIN V) 500 MG tablet, Take 1 tablet by mouth daily. For 4 to 6 weeks, Disp: , Rfl: 1 .  HYDROcodone-acetaminophen (NORCO) 5-325 MG tablet, Take 1 tablet by mouth every 6 (six) hours as needed for severe pain. (Patient not taking: Reported on 06/03/2018), Disp: 30 tablet, Rfl: 0 .   Lactobacillus Rhamnosus, GG, (CULTURELLE PROBIOTICS KIDS PO), Take 1 tablet by mouth daily., Disp: , Rfl:  .  metoprolol succinate (TOPROL-XL) 25 MG 24 hr tablet, Take 25 mg by mouth daily., Disp: , Rfl: 2 .  omeprazole (PRILOSEC) 20 MG capsule, Take 20 mg by mouth daily., Disp: , Rfl:  .  sacubitril-valsartan (ENTRESTO) 49-51 MG, Take 1 tablet by mouth 2 (two) times daily., Disp: , Rfl:  .  selenium (SELENIMIN-200) 200 MCG TABS tablet, Take 200 mcg by mouth daily. , Disp: , Rfl:  .  silver sulfADIAZINE (SILVADENE) 1 % cream, Apply 1 application topically daily as needed (PAIN)., Disp: , Rfl:  .  spironolactone (ALDACTONE) 25 MG tablet, Take 12.5 mg by mouth daily., Disp: , Rfl:  .  SUMAtriptan (IMITREX) 100 MG tablet, Take 100 mg by mouth every 6 (six) hours as needed for migraine. May repeat in 2 hours if headache persists or recurs., Disp: , Rfl:  .  traMADol (ULTRAM) 50 MG tablet, Take 50 mg by mouth 2 (two) times daily as needed for moderate pain. , Disp: , Rfl:   Past Medical History: Past Medical History:  Diagnosis Date  . Anemia   . Autoimmune disorder (HCC)   . Bronchitis    hx -uses inhaler prn  . Chronic kidney disease    kidney infections  . Endometriosis   . Fibromyalgia   . GERD (  gastroesophageal reflux disease)    occasional  . Hashimoto thyroiditis, fibrous variant   . Hashimoto's disease   . Headache    migraines  . History of kidney stones   . Hypertension   . IVCD (intraventricular conduction defect)   . LBBB (left bundle branch block)   . Nonischemic cardiomyopathy (HCC)    per stress test 12-17-2017  ef 14%;  per echo 12-13-2017 ef 21%  . Peripheral vascular disease (HCC)    raynoids syndrome  . Pneumonia    only x 1 - 2- 3 months ago  . 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 Scleroderma (HCC)   . Sjogren's syndrome (HCC)   . Sleep apnea    recently dx 04/13/18, does not have CPAP machine yet.  . Systemic  sclerosis (HCC)   . Systolic CHF with reduced left ventricular function, NYHA class 3 [facility])    cardiologist-  dr Evlyn Clines patwardhan  . Tendinitis    lower back  . Thyroiditis     Tobacco Use: Social History   Tobacco Use  Smoking Status Former Smoker  . Packs/day: 0.50  . Years: 8.00  . Pack years: 4.00  . Types: Cigarettes  . Last attempt to quit: 12/10/2012  . Years since quitting: 5.5  Smokeless Tobacco Never Used    Labs: Recent Review Advice worker    Labs for ITP Cardiac and Pulmonary Rehab Latest Ref Rng & Units 12/18/2017 12/18/2017   PHART 7.350 - 7.450 - 7.365   PCO2ART 32.0 - 48.0 mmHg - 42.9   HCO3 20.0 - 28.0 mmol/L 25.9 24.5   TCO2 22 - 32 mmol/L 27 26   ACIDBASEDEF 0.0 - 2.0 mmol/L - 1.0   O2SAT % 69.0 99.0      Capillary Blood Glucose: No results found for: GLUCAP   Exercise Target Goals: Exercise Program Goal: Individual exercise prescription set using results from initial 6 min walk test and THRR while considering  patient's activity barriers and safety.   Exercise Prescription Goal: Initial exercise prescription builds to 30-45 minutes a day of aerobic activity, 2-3 days per week.  Home exercise guidelines will be given to patient during program as part of exercise prescription that the participant will acknowledge.  Activity Barriers & Risk Stratification: Activity Barriers & Cardiac Risk Stratification - 05/28/18 0857      Activity Barriers & Cardiac Risk Stratification   Activity Barriers  Other (comment)    Comments  Autoimmune disease-right side gets weak. Joint pain    Cardiac Risk Stratification  High       6 Minute Walk: 6 Minute Walk    Row Name 05/28/18 0950         6 Minute Walk   Phase  Initial     Distance  1487 feet     Walk Time  6 minutes     # of Rest Breaks  0     MPH  2.82     METS  3.55     RPE  12     Perceived Dyspnea   0     VO2 Peak  12.41     Symptoms  Yes (comment)     Comments  Patient c/o bilateral  ankle and foot pain/neuropathy, "pins and needles". Ankle pain "2-3/10" on the pain scale.     Resting HR  81 bpm     Resting BP  120/80  Resting Oxygen Saturation   98 %     Exercise Oxygen Saturation  during 6 min walk  99 %     Max Ex. HR  119 bpm     Max Ex. BP  120/70     2 Minute Post BP  104/74        Oxygen Initial Assessment:   Oxygen Re-Evaluation:   Oxygen Discharge (Final Oxygen Re-Evaluation):   Initial Exercise Prescription: Initial Exercise Prescription - 05/28/18 1000      Date of Initial Exercise RX and Referring Provider   Date  05/28/18    Referring Provider  Truett Mainland, MD    Expected Discharge Date  08/19/18      Recumbant Bike   Level  2    Minutes  10    METs  2.5      NuStep   Level  3    SPM  85    Minutes  10    METs  2.5      Track   Laps  12    Minutes  10    METs  3.09      Prescription Details   Frequency (times per week)  3    Duration  Progress to 30 minutes of continuous aerobic without signs/symptoms of physical distress      Intensity   THRR 40-80% of Max Heartrate  71-142    Ratings of Perceived Exertion  11-13    Perceived Dyspnea  0-4      Progression   Progression  Continue to progress workloads to maintain intensity without signs/symptoms of physical distress.      Resistance Training   Training Prescription  Yes    Weight  3lbs    Reps  10-15       Perform Capillary Blood Glucose checks as needed.  Exercise Prescription Changes: Exercise Prescription Changes    Row Name 06/03/18 1452 06/14/18 1500           Response to Exercise   Blood Pressure (Admit)  98/64  124/82      Blood Pressure (Exercise)  132/80  142/72      Blood Pressure (Exit)  108/80  118/78      Heart Rate (Admit)  86 bpm  98 bpm      Heart Rate (Exercise)  112 bpm  156 bpm      Heart Rate (Exit)  85 bpm  98 bpm      Rating of Perceived Exertion (Exercise)  12  12      Symptoms  Parasthesia, both feet, chronic.   Parasthesia, both feet, chronic.      Duration  Progress to 30 minutes of  aerobic without signs/symptoms of physical distress  Progress to 30 minutes of  aerobic without signs/symptoms of physical distress      Intensity  THRR unchanged  THRR unchanged        Progression   Progression  Continue to progress workloads to maintain intensity without signs/symptoms of physical distress.  Continue to progress workloads to maintain intensity without signs/symptoms of physical distress.      Average METs  2.6  3.1        Resistance Training   Training Prescription  Yes  Yes      Weight  3lbs  3lbs      Reps  10-15  10-15      Time  10 Minutes  10 Minutes  Interval Training   Interval Training  No  No        Recumbant Bike   Level  2  4      Minutes  10  10      METs  2  2.9        NuStep   Level  3  5      SPM  85  85      Minutes  10  10      METs  2.5  2.9        Track   Laps  7  15      Minutes  5  5      METs  3.44  3.6        Home Exercise Plan   Plans to continue exercise at  -  Home (comment) walking and doing yoga at home.      Frequency  -  Add 4 additional days to program exercise sessions.      Initial Home Exercises Provided  -  06/14/18         Exercise Comments: Exercise Comments    Row Name 06/03/18 1547 06/14/18 1512         Exercise Comments  Patient tolerated 1st session of exercise well, some difficulty with walking due to parasthesia in both feet.  Reviewed home exercise guidelines, METs, and goals with patient.         Exercise Goals and Review: Exercise Goals    Row Name 05/28/18 0857             Exercise Goals   Increase Physical Activity  Yes       Intervention  Provide advice, education, support and counseling about physical activity/exercise needs.;Develop an individualized exercise prescription for aerobic and resistive training based on initial evaluation findings, risk stratification, comorbidities and participant's personal  goals.       Expected Outcomes  Short Term: Attend rehab on a regular basis to increase amount of physical activity.;Long Term: Exercising regularly at least 3-5 days a week.;Long Term: Add in home exercise to make exercise part of routine and to increase amount of physical activity.       Increase Strength and Stamina  Yes       Intervention  Provide advice, education, support and counseling about physical activity/exercise needs.;Develop an individualized exercise prescription for aerobic and resistive training based on initial evaluation findings, risk stratification, comorbidities and participant's personal goals.       Expected Outcomes  Short Term: Increase workloads from initial exercise prescription for resistance, speed, and METs.;Short Term: Perform resistance training exercises routinely during rehab and add in resistance training at home;Long Term: Improve cardiorespiratory fitness, muscular endurance and strength as measured by increased METs and functional capacity ( )       Able to understand and use rate of perceived exertion (RPE) scale  Yes       Intervention  Provide education and explanation on how to use RPE scale       Expected Outcomes  Short Term: Able to use RPE daily in rehab to express subjective intensity level;Long Term:  Able to use RPE to guide intensity level when exercising independently       Knowledge and understanding of Target Heart Rate Range (THRR)  Yes       Intervention  Provide education and explanation of THRR including how the numbers were predicted and where they are located for reference  Expected Outcomes  Short Term: Able to state/look up THRR;Long Term: Able to use THRR to govern intensity when exercising independently;Short Term: Able to use daily as guideline for intensity in rehab       Able to check English independently  Yes       Intervention  Provide education and demonstration on how to check English in carotid and radial arteries.;Review the  importance of being able to check your own English for safety during independent exercise       Expected Outcomes  Short Term: Able to explain why English checking is important during independent exercise;Long Term: Able to check English independently and accurately       Understanding of Exercise Prescription  Yes       Intervention  Provide education, explanation, and written materials on patient's individual exercise prescription       Expected Outcomes  Short Term: Able to explain program exercise prescription;Long Term: Able to explain home exercise prescription to exercise independently          Exercise Goals Re-Evaluation : Exercise Goals Re-Evaluation    Row Name 06/03/18 1547 06/14/18 1512           Exercise Goal Re-Evaluation   Exercise Goals Review  Increase Physical Activity;Able to understand and use rate of perceived exertion (RPE) scale  Increase Physical Activity;Able to understand and use rate of perceived exertion (RPE) scale;Understanding of Exercise Prescription;Knowledge and understanding of Target Heart Rate Range (THRR)      Comments  Patient able to understand and use RPE scale appropriately. Walking limited due to parasthesia, both feet.  Reviewed home exercise guidelines with patient including THRR, RPE scale, and endpoints for exercise. Pt is walking ~2 miles in 30 minutes, 4 days/week and is doing yoga 20 minutes daily. Walking is limited by joint, feet, and ankle soreness from autoimmune disaese. Pt given Sarah English handout.      Expected Outcomes  Increase workloads as tolerated to help increase strength and stamina and achieve persoanl health and fitness goals.  Continue to progress workloads to improve cardiorespiratory fitness.         Discharge Exercise Prescription (Final Exercise Prescription Changes): Exercise Prescription Changes - 06/14/18 1500      Response to Exercise   Blood Pressure (Admit)  124/82    Blood Pressure (Exercise)  142/72     Blood Pressure (Exit)  118/78    Heart Rate (Admit)  98 bpm    Heart Rate (Exercise)  156 bpm    Heart Rate (Exit)  98 bpm    Rating of Perceived Exertion (Exercise)  12    Symptoms  Parasthesia, both feet, chronic.    Duration  Progress to 30 minutes of  aerobic without signs/symptoms of physical distress    Intensity  THRR unchanged      Progression   Progression  Continue to progress workloads to maintain intensity without signs/symptoms of physical distress.    Average METs  3.1      Resistance Training   Training Prescription  Yes    Weight  3lbs    Reps  10-15    Time  10 Minutes      Interval Training   Interval Training  No      Recumbant Bike   Level  4    Minutes  10    METs  2.9      NuStep   Level  5    SPM  85  Minutes  10    METs  2.9      Track   Laps  15    Minutes  5    METs  3.6      Home Exercise Plan   Plans to continue exercise at  Home (comment)   walking and doing yoga at home.   Frequency  Add 4 additional days to program exercise sessions.    Initial Home Exercises Provided  06/14/18       Nutrition:  Target Goals: Understanding of nutrition guidelines, daily intake of sodium 1500mg , cholesterol 200mg , calories 30% from fat and 7% or less from saturated fats, daily to have 5 or more servings of fruits and vegetables.  Biometrics: Pre Biometrics - 05/28/18 0850      Pre Biometrics   Height  5' 5.5" (1.664 m)    Weight  (!) 136.8 kg    Waist Circumference  48.5 inches    Hip Circumference  57.75 inches    Waist to Hip Ratio  0.84 %    BMI (Calculated)  49.41    Triceps Skinfold  61 mm    % Body Fat  57.7 %    Grip Strength  33.5 kg    Flexibility  12.5 in    Single Leg Stand  30 seconds        Nutrition Therapy Plan and Nutrition Goals:   Nutrition Assessments:   Nutrition Goals Re-Evaluation:   Nutrition Goals Re-Evaluation:   Nutrition Goals Discharge (Final Nutrition Goals  Re-Evaluation):   Psychosocial: Target Goals: Acknowledge presence or absence of significant depression and/or stress, maximize coping skills, provide positive support system. Participant is able to verbalize types and ability to use techniques and skills needed for reducing stress and depression.  Initial Review & Psychosocial Screening: Initial Psych Review & Screening - 05/28/18 1022      Initial Review   Current issues with  None Identified      Family Dynamics   Good Support System?  Yes   parents, son, best friend, support group     Barriers   Psychosocial barriers to participate in program  There are no identifiable barriers or psychosocial needs.      Screening Interventions   Interventions  Encouraged to exercise       Quality of Life Scores: Quality of Life - 05/28/18 1022      Quality of Life   Select  Quality of Life      Quality of Life Scores   Health/Function Pre  14.5 %    Socioeconomic Pre  16.8 %    Psych/Spiritual Pre  19.6 %    Family Pre  24.1 %    GLOBAL Pre  17.4 %      Scores of 19 and below usually indicate a poorer quality of life in these areas.  A difference of  2-3 points is a clinically meaningful difference.  A difference of 2-3 points in the total score of the Quality of Life Index has been associated with significant improvement in overall quality of life, self-image, physical symptoms, and general health in studies assessing change in quality of life.  PHQ-9: Recent Review Flowsheet Data    Depression screen [facility] 2/9 06/03/2018   Decreased Interest 0   Down, Depressed, Hopeless 0   PHQ - 2 Score 0     Interpretation of Total Score  Total Score Depression Severity:  1-4 = Minimal depression, 5-9 = Mild depression, 10-14 = Moderate depression,  15-19 = Moderately severe depression, 20-27 = Severe depression   Psychosocial Evaluation and Intervention:   Psychosocial Re-Evaluation: Psychosocial Re-Evaluation    Row Name 06/20/18  1506             Psychosocial Re-Evaluation   Current issues with  Current Stress Concerns       Comments  Sarah English does have some health related concerns. Will continue to offer support as needed       Expected Outcomes  Sarah English will have decreased stress nupon discharge from cardiac rehab       Interventions  Encouraged to attend Cardiac Rehabilitation for the exercise;Stress management education         Initial Review   Source of Stress Concerns  Chronic Illness          Psychosocial Discharge (Final Psychosocial Re-Evaluation): Psychosocial Re-Evaluation - 06/20/18 1506      Psychosocial Re-Evaluation   Current issues with  Current Stress Concerns    Comments  Sarah English does have some health related concerns. Will continue to offer support as needed    Expected Outcomes  Sarah English will have decreased stress nupon discharge from cardiac rehab    Interventions  Encouraged to attend Cardiac Rehabilitation for the exercise;Stress management education      Initial Review   Source of Stress Concerns  Chronic Illness       Vocational Rehabilitation: Provide vocational rehab assistance to qualifying candidates.   Vocational Rehab Evaluation & Intervention: Vocational Rehab - 06/04/18 1549      Vocational Rehab Re-Evaulation   Comments  Vocational rehab formed faxed to Vocational Rehab office       Education: Education Goals: Education classes will be provided on a weekly basis, covering required topics. Participant will state understanding/return demonstration of topics presented.  Learning Barriers/Preferences: Learning Barriers/Preferences - 05/27/18 1552      Learning Barriers/Preferences   Learning Barriers  Sight    Learning Preferences  Written Material;Verbal Instruction;Video;Group Instruction       Education Topics: Count Your English:  -Group instruction provided by verbal instruction, demonstration, patient participation and written materials to support  subject.  Instructors address importance of being able to find your English and how to count your English when at home without a heart monitor.  Patients get hands on experience counting their English with staff help and individually.   Heart Attack, Angina, and Risk Factor Modification:  -Group instruction provided by verbal instruction, video, and written materials to support subject.  Instructors address signs and symptoms of angina and heart attacks.    Also discuss risk factors for heart disease and how to make changes to improve heart health risk factors.   Functional Fitness:  -Group instruction provided by verbal instruction, demonstration, patient participation, and written materials to support subject.  Instructors address safety measures for doing things around the house.  Discuss how to get up and down off the floor, how to pick things up properly, how to safely get out of a chair without assistance, and balance training.   Meditation and Mindfulness:  -Group instruction provided by verbal instruction, patient participation, and written materials to support subject.  Instructor addresses importance of mindfulness and meditation practice to help reduce stress and improve awareness.  Instructor also leads participants through a meditation exercise.    Stretching for Flexibility and Mobility:  -Group instruction provided by verbal instruction, patient participation, and written materials to support subject.  Instructors lead participants through series of stretches that are designed to  increase flexibility thus improving mobility.  These stretches are additional exercise for major muscle groups that are typically performed during regular warm up and cool down.   Hands Only CPR:  -Group verbal, video, and participation provides a basic overview of AHA guidelines for community CPR. Role-play of emergencies allow participants the opportunity to practice calling for help and chest compression  technique with discussion of AED use.   Hypertension: -Group verbal and written instruction that provides a basic overview of hypertension including the most recent diagnostic guidelines, risk factor reduction with self-care instructions and medication management.    Nutrition I class: Heart Healthy Eating:  -Group instruction provided by PowerPoint slides, verbal discussion, and written materials to support subject matter. The instructor gives an explanation and review of the Therapeutic Lifestyle Changes diet recommendations, which includes a discussion on lipid goals, dietary fat, sodium, fiber, plant stanol/sterol esters, sugar, and the components of a well-balanced, healthy diet.   Nutrition II class: Lifestyle Skills:  -Group instruction provided by PowerPoint slides, verbal discussion, and written materials to support subject matter. The instructor gives an explanation and review of label reading, grocery shopping for heart health, heart healthy recipe modifications, and ways to make healthier choices when eating out.   Diabetes Question & Answer:  -Group instruction provided by PowerPoint slides, verbal discussion, and written materials to support subject matter. The instructor gives an explanation and review of diabetes co-morbidities, pre- and post-prandial blood glucose goals, pre-exercise blood glucose goals, signs, symptoms, and treatment of hypoglycemia and hyperglycemia, and foot care basics.   Diabetes Blitz:  -Group instruction provided by PowerPoint slides, verbal discussion, and written materials to support subject matter. The instructor gives an explanation and review of the physiology behind type 1 and type 2 diabetes, diabetes medications and rational behind using different medications, pre- and post-prandial blood glucose recommendations and Hemoglobin A1c goals, diabetes diet, and exercise including blood glucose guidelines for exercising safely.    Portion Distortion:   -Group instruction provided by PowerPoint slides, verbal discussion, written materials, and food models to support subject matter. The instructor gives an explanation of serving size versus portion size, changes in portions sizes over the last 20 years, and what consists of a serving from each food group.   Stress Management:  -Group instruction provided by verbal instruction, video, and written materials to support subject matter.  Instructors review role of stress in heart disease and how to cope with stress positively.     Exercising on Your Own:  -Group instruction provided by verbal instruction, power point, and written materials to support subject.  Instructors discuss benefits of exercise, components of exercise, frequency and intensity of exercise, and end points for exercise.  Also discuss use of nitroglycerin and activating EMS.  Review options of places to exercise outside of rehab.  Review guidelines for sex with heart disease.   Cardiac Drugs I:  -Group instruction provided by verbal instruction and written materials to support subject.  Instructor reviews cardiac drug classes: antiplatelets, anticoagulants, beta blockers, and statins.  Instructor discusses reasons, side effects, and lifestyle considerations for each drug class.   Cardiac Drugs II:  -Group instruction provided by verbal instruction and written materials to support subject.  Instructor reviews cardiac drug classes: angiotensin converting enzyme inhibitors (ACE-I), angiotensin II receptor blockers (ARBs), nitrates, and calcium channel blockers.  Instructor discusses reasons, side effects, and lifestyle considerations for each drug class.   Anatomy and Physiology of the Circulatory System:  Group verbal and written instruction  and models provide basic cardiac anatomy and physiology, with the coronary electrical and arterial systems. Review of: AMI, Angina, Valve disease, Heart Failure, Peripheral Artery Disease,  Cardiac Arrhythmia, Pacemakers, and the ICD.   Other Education:  -Group or individual verbal, written, or video instructions that support the educational goals of the cardiac rehab program.   Holiday Eating Survival Tips:  -Group instruction provided by PowerPoint slides, verbal discussion, and written materials to support subject matter. The instructor gives patients tips, tricks, and techniques to help them not only survive but enjoy the holidays despite the onslaught of food that accompanies the holidays.   Knowledge Questionnaire Score: Knowledge Questionnaire Score - 05/28/18 1023      Knowledge Questionnaire Score   Pre Score  24/24       Core Components/Risk Factors/Patient Goals at Admission: Personal Goals and Risk Factors at Admission - 05/28/18 1121      Core Components/Risk Factors/Patient Goals on Admission    Weight Management  Yes;Obesity    Intervention  Weight Management/Obesity: Establish reasonable short term and long term weight goals.;Obesity: Provide education and appropriate resources to help participant work on and attain dietary goals.    Admit Weight  301 lb 9.4 oz (136.8 kg)    Expected Outcomes  Short Term: Continue to assess and modify interventions until short term weight is achieved;Long Term: Adherence to nutrition and physical activity/exercise program aimed toward attainment of established weight goal;Weight Loss: Understanding of general recommendations for a balanced deficit meal plan, which promotes 1-2 lb weight loss per week and includes a negative energy balance of [phone] kcal/d;Understanding recommendations for meals to include 15-35% energy as protein, 25-35% energy from fat, 35-60% energy from carbohydrates, less than 200mg  of dietary cholesterol, 20-35 gm of total fiber daily;Understanding of distribution of calorie intake throughout the day with the consumption of 4-5 meals/snacks    Hypertension  Yes    Intervention  Provide education on  lifestyle modifcations including regular physical activity/exercise, weight management, moderate sodium restriction and increased consumption of fresh fruit, vegetables, and low fat dairy, alcohol moderation, and smoking cessation.;Monitor prescription use compliance.    Expected Outcomes  Short Term: Continued assessment and intervention until BP is < 140/71mm HG in hypertensive participants. < 130/25mm HG in hypertensive participants with diabetes, heart failure or chronic kidney disease.;Long Term: Maintenance of blood pressure at goal levels.    Stress  Yes    Intervention  Offer individual and/or small group education and counseling on adjustment to heart disease, stress management and health-related lifestyle change. Teach and support self-help strategies.;Refer participants experiencing significant psychosocial distress to appropriate mental health specialists for further evaluation and treatment. When possible, include family members and significant others in education/counseling sessions.    Expected Outcomes  Short Term: Participant demonstrates changes in health-related behavior, relaxation and other stress management skills, ability to obtain effective social support, and compliance with psychotropic medications if prescribed.;Long Term: Emotional wellbeing is indicated by absence of clinically significant psychosocial distress or social isolation.       Core Components/Risk Factors/Patient Goals Review:  Goals and Risk Factor Review    Row Name 06/20/18 1508             Core Components/Risk Factors/Patient Goals Review   Personal Goals Review  Weight Management/Obesity;Hypertension;Stress       Review  Severina's vital signs have been stable at phase 2 cardiac rehab. Will continue to monitor stressors and offer support as needed       Expected  Outcomes  Emonii will continue to participate in phase 2 cardiac rehab for exercise, follow nutrtion and lifestyle modfication opportunities.           Core Components/Risk Factors/Patient Goals at Discharge (Final Review):  Goals and Risk Factor Review - 06/20/18 1508      Core Components/Risk Factors/Patient Goals Review   Personal Goals Review  Weight Management/Obesity;Hypertension;Stress    Review  Philicia's vital signs have been stable at phase 2 cardiac rehab. Will continue to monitor stressors and offer support as needed    Expected Outcomes  Anye will continue to participate in phase 2 cardiac rehab for exercise, follow nutrtion and lifestyle modfication opportunities.       ITP Comments: ITP Comments    Row Name 05/27/18 1549 06/20/18 1504         ITP Comments  Dr. Armanda Magic, Medical Director   30 Day ITP Review. Patient with good atendance and participation in phase 2 cardiac rehab.         Comments: See ITP Comments.Gladstone Lighter, RN,BSN 06/20/2018 3:17 PM

## 2018-06-21 ENCOUNTER — Encounter (HOSPITAL_COMMUNITY): Payer: Medicaid Other

## 2018-06-21 ENCOUNTER — Encounter (HOSPITAL_COMMUNITY)
Admission: RE | Admit: 2018-06-21 | Discharge: 2018-06-21 | Disposition: A | Discharge status: Home / Self Care | Payer: Medicaid Other | Source: Ambulatory Visit | Attending: Cardiology | Admitting: Cardiology

## 2018-06-21 DIAGNOSIS — I5022 Chronic systolic (congestive) heart failure: Secondary | ICD-10-CM

## 2018-06-21 DIAGNOSIS — I420 Dilated cardiomyopathy: Secondary | ICD-10-CM | POA: Diagnosis not present

## 2018-06-24 ENCOUNTER — Encounter (HOSPITAL_COMMUNITY): Payer: Medicaid Other

## 2018-06-24 ENCOUNTER — Telehealth (HOSPITAL_COMMUNITY): Payer: Self-pay | Admitting: Family Medicine

## 2018-06-26 ENCOUNTER — Encounter (HOSPITAL_COMMUNITY)
Admission: RE | Admit: 2018-06-26 | Discharge: 2018-06-26 | Disposition: A | Discharge status: Home / Self Care | Payer: Medicaid Other | Source: Ambulatory Visit | Attending: Cardiology | Admitting: Cardiology

## 2018-06-26 ENCOUNTER — Encounter (HOSPITAL_COMMUNITY): Payer: Medicaid Other

## 2018-06-26 DIAGNOSIS — I420 Dilated cardiomyopathy: Secondary | ICD-10-CM | POA: Diagnosis not present

## 2018-06-26 DIAGNOSIS — I5022 Chronic systolic (congestive) heart failure: Secondary | ICD-10-CM

## 2018-06-28 ENCOUNTER — Encounter (HOSPITAL_COMMUNITY): Payer: Medicaid Other

## 2018-06-28 ENCOUNTER — Encounter (HOSPITAL_COMMUNITY)
Admission: RE | Admit: 2018-06-28 | Discharge: 2018-06-28 | Disposition: A | Discharge status: Home / Self Care | Payer: Medicaid Other | Source: Ambulatory Visit | Attending: Cardiology | Admitting: Cardiology

## 2018-06-28 DIAGNOSIS — I420 Dilated cardiomyopathy: Secondary | ICD-10-CM | POA: Diagnosis not present

## 2018-06-28 DIAGNOSIS — I5022 Chronic systolic (congestive) heart failure: Secondary | ICD-10-CM

## 2018-07-01 ENCOUNTER — Encounter (HOSPITAL_COMMUNITY): Payer: Medicaid Other

## 2018-07-03 ENCOUNTER — Encounter: Payer: Self-pay | Admitting: Adult Health

## 2018-07-03 ENCOUNTER — Encounter (HOSPITAL_COMMUNITY): Payer: Medicaid Other

## 2018-07-03 ENCOUNTER — Telehealth (HOSPITAL_COMMUNITY): Payer: Self-pay | Admitting: Family Medicine

## 2018-07-04 ENCOUNTER — Encounter: Payer: Self-pay | Admitting: Adult Health

## 2018-07-04 ENCOUNTER — Ambulatory Visit (INDEPENDENT_AMBULATORY_CARE_PROVIDER_SITE_OTHER): Payer: Medicaid Other | Admitting: Adult Health

## 2018-07-04 VITALS — BP 149/87 | HR 85 | Ht 65.5 in | Wt 306.8 lb

## 2018-07-04 DIAGNOSIS — G4733 Obstructive sleep apnea (adult) (pediatric): Secondary | ICD-10-CM

## 2018-07-04 DIAGNOSIS — Z9989 Dependence on other enabling machines and devices: Secondary | ICD-10-CM

## 2018-07-04 NOTE — Progress Notes (Signed)
PATIENT: Sarah English DOB: 29-Apr-1976  REASON FOR VISIT: follow up CPAP HISTORY FROM: patient  HISTORY OF PRESENT ILLNESS: Today 07/04/18:  Sarah English is a 42 year old female with a history of obstructive sleep apnea on CPAP.  29 out of 30 days for compliance of 97%.  She used her machine greater than 4 hours 23 days for compliance of 77%.She returns today for follow-up.  Her download indicates that she used her machine on average she uses her machine 6 hours and 7 minutes.  Her residual AHI is 0.8 on 10 cmH2O with EPR of 1.  She does not have a significant leak.  She states that she can tell the benefit already.  She states that she is now getting 7 to 8 hours of sleep every night.  Her Epworth sleepiness score is 5.  She returns today for evaluation.  HISTORY (Copied from Dr.Dohmeier's note) Sarah English is a 42 y.o. female patient, seen here in a referral from Dr. Rosemary Holms for a sleep consultation.   Chief complaint according to patient : ' I don't sleep well at all- I never have . I couldn't sleep well as a teenager. I  was diagnosed with an autoimmune disorder ( Hashimoto's thyroiditis , scleroderma and Sjgren's  Disease)  and I am fatigued - I have pain and deal always with inflammation". She reports it all started with mononucleosis.  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  She also carries a diagnosis of hypertension and ophthalmic migraine.  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 By 10:00 she may have to watch TV for an hour and she  turns off all electronic devices, denies to light and retreats to her bedroom.   the bedroom would be cool,quiet and dark.  Depending on the pain intensity she may be able to sleep for 5 hours versus only 2 or 3 hours.  Some nights she spends on her sofa depending on her back pain. She sleeps best supine, and with 3 pillows or in a recliner, likes knee support.  She has to take furosemide-Lasix twice a day to control her cardiomyopathy related heart failure. Nocturia 1-3 times. She snores loud, is not sure about pauses in breathing.She reports on average sleeping 4 hours at night.   Sleep medical history and family sleep history: father has PD, both parents have HTN, DM, Obesity, mother has CAD- nobody has Crohn's , RF, MS, LUPUS  or vitiligo.   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 She is the mother of one son, 43 years old, living on a farm. Non smoker for 6 years, ETOH - very seldomly, " I get sick " caffeine : coffee in AM 1 cup, 1 hot tea at night, decaffeinated. No energy drinks, rare  sodas ( 1 a month ).  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   REVIEW OF SYSTEMS: Out of  a complete 14 system review of symptoms, the patient complains only of the following symptoms, and all other reviewed systems are negative.  See HPI  ALLERGIES: Allergies  Allergen Reactions  . Sulfa Antibiotics Nausea And Vomiting    HOME MEDICATIONS: Outpatient Medications Prior to Visit  Medication Sig Dispense Refill  . albuterol (PROVENTIL HFA;VENTOLIN HFA) 108 (90 Base) MCG/ACT inhaler Inhale into the lungs every 6 (six) hours as needed for wheezing or shortness of breath.    . Aloe-Sodium Chloride (AYR SALINE NASAL GEL NA) Place 1 spray into the nose daily.    Marland Kitchen antiseptic oral rinse (BIOTENE) LIQD 1-2 application by Mouth Rinse route every hour as needed for dry mouth.    Marland Kitchen CAMILA 0.35  MG tablet Take 1 tablet by mouth at bedtime.   1  . CORLANOR 5 MG TABS tablet Take 5 mg by mouth 2 (two) times daily.  2  . cyclobenzaprine (FLEXERIL) 10 MG tablet Take 10 mg by mouth 3 (three) times daily as needed for muscle spasms.    . cycloSPORINE (RESTASIS) 0.05 % ophthalmic emulsion Place 1 drop into both eyes 2 (two) times daily.     . furosemide (LASIX) 20 MG tablet Take 20 mg by mouth daily.     Marland Kitchen gabapentin (NEURONTIN) 300 MG capsule Take 1 capsule (300 mg total) by mouth 3 (three) times daily. 90 capsule 5  . griseofulvin (GRIFULVIN V) 500 MG tablet Take 1 tablet by mouth daily. For 4 to 6 weeks  1  . HYDROcodone-acetaminophen (NORCO) 5-325 MG tablet Take 1 tablet by mouth every 6 (six) hours as needed for severe pain. (Patient not taking: Reported on 06/03/2018) 30 tablet 0  . Lactobacillus Rhamnosus, GG, (CULTURELLE PROBIOTICS KIDS PO) Take 1 tablet by mouth daily.    . metoprolol succinate (TOPROL-XL) 25 MG 24 hr tablet Take 25 mg by mouth daily.  2  . omeprazole (PRILOSEC) 20 MG capsule Take 20 mg by mouth daily.    . sacubitril-valsartan (ENTRESTO) 49-51 MG Take 1 tablet by mouth 2 (two) times daily.    Marland Kitchen selenium (SELENIMIN-200) 200 MCG TABS tablet Take 200 mcg by mouth daily.     . silver sulfADIAZINE (SILVADENE) 1 % cream Apply 1 application topically daily as needed (PAIN).    Marland Kitchen spironolactone (ALDACTONE) 25 MG tablet Take 12.5 mg by mouth daily.    . SUMAtriptan (IMITREX) 100 MG tablet Take 100 mg by mouth every 6 (six) hours as needed for migraine. May repeat in 2 hours if headache persists or recurs.    . traMADol (ULTRAM) 50 MG tablet Take 50 mg by mouth 2 (two) times daily as needed for moderate pain.      No facility-administered medications prior to visit.     PAST MEDICAL HISTORY: Past Medical History:  Diagnosis Date  . Anemia   . Autoimmune disorder (HCC)   . Bronchitis    hx -uses inhaler prn  . Chronic kidney disease    kidney infections  . Endometriosis    . Fibromyalgia   . GERD (gastroesophageal reflux disease)    occasional  . Hashimoto thyroiditis, fibrous variant   . Hashimoto's disease   . Headache    migraines  . History of kidney stones   . Hypertension   . IVCD (intraventricular conduction defect)   . LBBB (left bundle branch block)   . Nonischemic cardiomyopathy (HCC)    per stress test 12-17-2017  ef 14%;  per echo 12-13-2017 ef 21%  .  Peripheral vascular disease (HCC)    raynoids syndrome  . Pneumonia    only x 1 - 2- 3 months ago  . 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 Scleroderma (HCC)   . Sjogren's syndrome (HCC)   . Sleep apnea    recently dx 04/13/18, does not have CPAP machine yet.  . Systemic sclerosis (HCC)   . 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 Tendinitis    lower back  . Thyroiditis     PAST SURGICAL HISTORY: Past Surgical History:  Procedure Laterality Date  . CESAREAN SECTION  2004   x 1  . CHOLECYSTECTOMY    . COLONOSCOPY    . CYSTOSCOPY     x 2 - bladder as a child  . HYSTEROSCOPY W/D&C N/A 04/16/2018   Procedure: DILATATION AND CURETTAGE /HYSTEROSCOPY;  Surgeon: Lavina Hamman, MD;  Location: [facility];  Service: Gynecology;  Laterality: N/A;  . LAPAROSCOPIC LYSIS OF ADHESIONS  04/16/2018   Procedure: LAPAROSCOPIC LYSIS OF ADHESIONS;  Surgeon: Lavina Hamman, MD;  Location: [facility];  Service: Gynecology;;  . MANDIBLE FRACTURE SURGERY  1994   upper mandibular  . RIGHT/LEFT HEART CATH AND CORONARY ANGIOGRAPHY N/A 12/18/2017   Procedure: RIGHT/LEFT HEART CATH AND CORONARY ANGIOGRAPHY;  Surgeon: Elder Negus, MD;  Location: [facility];  Service: Cardiovascular;  Laterality: N/A;  . UPPER GI ENDOSCOPY    . WISDOM TOOTH EXTRACTION      FAMILY HISTORY: Family History  Problem Relation Age of Onset  . Diabetes Mother        Type II  . Heart disease  Mother        stent surgery  . Hypertension Mother   . Hyperlipidemia Mother   . Diabetes Father        Type II  . Hypertension Father   . Hyperlipidemia Father     SOCIAL HISTORY: Social History   Socioeconomic History  . Marital status: Single    Spouse name: Not on file  . Number of children: 1  . Years of education: 88  . Highest education level: Associate degree: occupational, Scientist, product/process development, or vocational program  Occupational History  . 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 Financial resource strain: Not on file  . Food insecurity:    Worry: Not on file    Inability: Not on file  . Transportation needs:    Medical: Not on file    Non-medical: Not on file  Tobacco Use  . Smoking status: Former Smoker    Packs/day: 0.50    Years: 8.00    Pack years: 4.00    Types: Cigarettes    Last attempt to quit: 12/10/2012    Years since quitting: 5.5  . Smokeless tobacco: Never Used  Substance and Sexual Activity  . Alcohol use: Yes    Comment: occasional wine  . Drug use: No  . Sexual activity: Yes    Birth control/protection: Pill  Lifestyle  . Physical activity:    Days per week: Not on file    Minutes per session: Not on file  . Stress: Not on file  Relationships  . Social connections:    Talks on phone: Not on file    Gets together: Not on file    Attends religious service: Not on file    Active member of club  or organization: Not on file    Attends meetings of clubs or organizations: Not on file    Relationship status: Not on file  . Intimate partner violence:    Fear of current or ex partner: Not on file    Emotionally abused: Not on file    Physically abused: Not on file    Forced sexual activity: Not on file  Other Topics Concern  . Not on file  Social History Narrative   Lives with son, renting a room from her mother.  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     Education: college.       PHYSICAL  EXAM  Vitals:   07/04/18 0911  Weight: (!) 306 lb 12.8 oz (139.2 kg)  Height: 5' 5.5" (1.664 m)   Body mass index is 50.28 kg/m.  Generalized: Well developed, in no acute distress   Neurological examination  Mentation: Alert oriented to time, place, history taking. Follows all commands speech and language fluent Cranial nerve II-XII: Pupils were equal round reactive to light. Extraocular movements were full, visual field were full on confrontational test. Facial sensation and strength were normal. Uvula tongue midline. Head turning and shoulder shrug  were normal and symmetric. Motor: The motor testing reveals 5 over 5 strength of all 4 extremities. Good symmetric motor tone is noted throughout.  Sensory: Sensory testing is intact to soft touch on all 4 extremities. No evidence of extinction is noted.  Coordination: Cerebellar testing reveals good finger-nose-finger and heel-to-shin bilaterally.  Gait and station: Gait is normal.    DIAGNOSTIC DATA (LABS, IMAGING, TESTING) - I reviewed patient records, labs, notes, testing and imaging myself where available.  Lab Results  Component Value Date   WBC 6.7 04/12/2018   HGB 14.4 04/12/2018   HCT 43.6 04/12/2018   MCV 92.2 04/12/2018   PLT 302 04/12/2018      Component Value Date/Time   NA 135 04/12/2018 1130   K 4.9 04/12/2018 1130   CL 100 04/12/2018 1130   CO2 25 04/12/2018 1130   GLUCOSE 91 04/12/2018 1130   BUN 13 04/12/2018 1130   CREATININE 0.96 04/12/2018 1130   CALCIUM 9.4 04/12/2018 1130   PROT 7.4 04/26/2018 0940   ALBUMIN 4.3 04/12/2018 1130   AST 33 04/12/2018 1130   ALT 45 (H) 04/12/2018 1130   ALKPHOS 54 04/12/2018 1130   BILITOT 1.4 (H) 04/12/2018 1130   GFRNONAA >60 04/12/2018 1130   GFRAA >60 04/12/2018 1130   No results found for: CHOL, HDL, LDLCALC, LDLDIRECT, TRIG, CHOLHDL No results found for: NGEX5M Lab Results  Component Value Date   VITAMINB12 379 04/26/2018   Lab Results  Component Value  Date   TSH 3.51 01/28/2018      ASSESSMENT AND PLAN 42 y.o. year old female  has a past medical history of Anemia, Autoimmune disorder (HCC), Bronchitis, Chronic kidney disease, Endometriosis, Fibromyalgia, GERD (gastroesophageal reflux disease), Hashimoto thyroiditis, fibrous variant, Hashimoto's disease, Headache, History of kidney stones, Hypertension, IVCD (intraventricular conduction defect), LBBB (left bundle branch block), Nonischemic cardiomyopathy (HCC), Peripheral vascular disease (HCC), Pneumonia, Pulmonary hypertension (HCC), Scleroderma (HCC), Sjogren's syndrome (HCC), Sleep apnea, Systemic sclerosis (HCC), Systolic CHF with reduced left ventricular function, NYHA class 3 (HCC), Tendinitis, and Thyroiditis. here with:  1.  Obstructive sleep apnea on CPAP  The patient CPAP download shows excellent compliance and good treatment of her apnea.  She is encouraged to continue using the CPAP nightly  and greater than 4 hours each night.  She is advised that if her symptoms worsen or she develops new symptoms she should let us know.  She will follow-up in 1 year or sooner if needed.  I spent 15 minutes with the patient. 50% of this time was spent reviewing CPAP download   Butch Penny, MSN, NP-C 07/04/2018, 9:16 AM [facility] [address] [place], [place] [address] [phone]

## 2018-07-04 NOTE — Patient Instructions (Signed)

## 2018-07-05 ENCOUNTER — Encounter (HOSPITAL_COMMUNITY): Payer: Medicaid Other

## 2018-07-05 ENCOUNTER — Telehealth (HOSPITAL_COMMUNITY): Payer: Self-pay | Admitting: Family Medicine

## 2018-07-08 ENCOUNTER — Encounter (HOSPITAL_COMMUNITY): Payer: Medicaid Other

## 2018-07-08 ENCOUNTER — Encounter (HOSPITAL_COMMUNITY)
Admission: RE | Admit: 2018-07-08 | Discharge: 2018-07-08 | Disposition: A | Discharge status: Home / Self Care | Payer: Medicaid Other | Source: Ambulatory Visit | Attending: Cardiology | Admitting: Cardiology

## 2018-07-08 DIAGNOSIS — I5022 Chronic systolic (congestive) heart failure: Secondary | ICD-10-CM

## 2018-07-08 DIAGNOSIS — I420 Dilated cardiomyopathy: Secondary | ICD-10-CM | POA: Diagnosis not present

## 2018-07-10 ENCOUNTER — Encounter (HOSPITAL_COMMUNITY): Payer: Medicaid Other

## 2018-07-10 ENCOUNTER — Encounter (HOSPITAL_COMMUNITY)
Admission: RE | Admit: 2018-07-10 | Discharge: 2018-07-10 | Disposition: A | Discharge status: Home / Self Care | Payer: Medicaid Other | Source: Ambulatory Visit | Attending: Cardiology | Admitting: Cardiology

## 2018-07-10 DIAGNOSIS — I5022 Chronic systolic (congestive) heart failure: Secondary | ICD-10-CM

## 2018-07-10 DIAGNOSIS — I420 Dilated cardiomyopathy: Secondary | ICD-10-CM | POA: Diagnosis not present

## 2018-07-11 NOTE — Progress Notes (Signed)
QUALITY OF LIFE SCORE REVIEW  Pt completed Quality of Life survey as a participant in Cardiac Rehab. Scores 21.0 or below are considered low. Pt score very low in several areas Overall 17.4, Health and Function 14,5, socioeconomic 16.8, physiological and spiritual 19.6, family 24.10. Patient quality of life slightly altered by physical constraints which limits ability to perform as prior to recent cardiac illness. Lesleigh reports feeling somewhat better and having more energy since starting cardiac rehab.  Offered emotional support and reassurance.  Will continue to monitor and intervene as necessary. Joelene denies being depressed.Will forward Jameka's quality of life questionnaire to Dr Damian Leavell office for review.Gladstone Lighter, RN,BSN 07/11/2018 3:38 PM

## 2018-07-11 NOTE — Progress Notes (Signed)
Cardiac Individual Treatment Plan  Patient Details  Name: Sarah English MRN: [id number] Date of Birth: [birth year] Referring Provider:     CARDIAC REHAB PHASE II ORIENTATION from 05/28/2018 in [facility] CARD[facility]AC REHAB  Referring Provider  Truett Mainland, MD      Initial Encounter Date:    CARDIAC REHAB PHASE II ORIENTATION from 05/28/2018 in [facility] CARDIAC REHAB  Date  05/28/18      Visit Diagnosis: 2019 Chronic systolic congestive heart failure (HCC)   Patient's Home Medications on Admission:  Current Outpatient Medications:  .  albuterol (PROVENTIL HFA;VENTOLIN HFA) 108 (90 Base) MCG/ACT inhaler, Inhale into the lungs every 6 (six) hours as needed for wheezing or shortness of breath., Disp: , Rfl:  .  Aloe-Sodium Chloride (AYR SALINE NASAL GEL NA), Place 1 spray into the nose daily., Disp: , Rfl:  .  antiseptic oral rinse (BIOTENE) LIQD, 1-2 application by Mouth Rinse route every hour as needed for dry mouth., Disp: , Rfl:  .  CAMILA 0.35 MG tablet, Take 1 tablet by mouth at bedtime. , Disp: , Rfl: 1 .  CORLANOR 5 MG TABS tablet, Take 5 mg by mouth 2 (two) times daily., Disp: , Rfl: 2 .  cyclobenzaprine (FLEXERIL) 10 MG tablet, Take 10 mg by mouth 3 (three) times daily as needed for muscle spasms., Disp: , Rfl:  .  cycloSPORINE (RESTASIS) 0.05 % ophthalmic emulsion, Place 1 drop into both eyes 2 (two) times daily. , Disp: , Rfl:  .  etonogestrel-ethinyl estradiol (NUVARING) 0.12-0.015 MG/24HR vaginal ring, Place 1 each vaginally every 28 (twenty-eight) days. Insert vaginally and leave in place for 3 consecutive weeks, then remove for 1 week., Disp: , Rfl:  .  furosemide (LASIX) 20 MG tablet, Take 20 mg by mouth daily. , Disp: , Rfl:  .  gabapentin (NEURONTIN) 300 MG capsule, Take 1 capsule (300 mg total) by mouth 3 (three) times daily., Disp: 90 capsule, Rfl: 5 .  griseofulvin (GRIFULVIN V) 500 MG tablet, Take 1 tablet by mouth daily. For 4 to  6 weeks, Disp: , Rfl: 1 .  HYDROcodone-acetaminophen (NORCO) 5-325 MG tablet, Take 1 tablet by mouth every 6 (six) hours as needed for severe pain. (Patient not taking: Reported on 06/03/2018), Disp: 30 tablet, Rfl: 0 .  Lactobacillus Rhamnosus, GG, (CULTURELLE PROBIOTICS KIDS PO), Take 1 tablet by mouth daily., Disp: , Rfl:  .  metoprolol succinate (TOPROL-XL) 25 MG 24 hr tablet, Take 25 mg by mouth daily., Disp: , Rfl: 2 .  omeprazole (PRILOSEC) 20 MG capsule, Take 20 mg by mouth daily., Disp: , Rfl:  .  sacubitril-valsartan (ENTRESTO) 49-51 MG, Take 1 tablet by mouth 2 (two) times daily., Disp: , Rfl:  .  selenium (SELENIMIN-200) 200 MCG TABS tablet, Take 200 mcg by mouth daily. , Disp: , Rfl:  .  silver sulfADIAZINE (SILVADENE) 1 % cream, Apply 1 application topically daily as needed (PAIN)., Disp: , Rfl:  .  spironolactone (ALDACTONE) 25 MG tablet, Take 12.5 mg by mouth daily., Disp: , Rfl:  .  SUMAtriptan (IMITREX) 100 MG tablet, Take 100 mg by mouth every 6 (six) hours as needed for migraine. May repeat in 2 hours if headache persists or recurs., Disp: , Rfl:  .  traMADol (ULTRAM) 50 MG tablet, Take 50 mg by mouth 2 (two) times daily as needed for moderate pain. , Disp: , Rfl:   Past Medical History: Past Medical History:  Diagnosis Date  . Anemia   .  Autoimmune disorder (HCC)   . Bronchitis    hx -uses inhaler prn  . Chronic kidney disease    kidney infections  . Endometriosis   . Fibromyalgia   . GERD (gastroesophageal reflux disease)    occasional  . Hashimoto thyroiditis, fibrous variant   . Hashimoto's disease   . Headache    migraines  . History of kidney stones   . Hypertension   . IVCD (intraventricular conduction defect)   . LBBB (left bundle branch block)   . Nonischemic cardiomyopathy (HCC)    per stress test 12-17-2017  ef 14%;  per echo 12-13-2017 ef 21%  . Peripheral vascular disease (HCC)    raynoids syndrome  . Pneumonia    only x 1 - 2- 3 months ago  .  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 Scleroderma (HCC)   . Sjogren's syndrome (HCC)   . Sleep apnea    recently dx 04/13/18, does not have CPAP machine yet.  . Systemic sclerosis (HCC)   . Systolic CHF with reduced left ventricular function, NYHA class 3 [facility])    cardiologist-  dr Evlyn Clines patwardhan  . Tendinitis    lower back  . Thyroiditis     Tobacco Use: Social History   Tobacco Use  Smoking Status Former Smoker  . Packs/day: 0.50  . Years: 8.00  . Pack years: 4.00  . Types: Cigarettes  . Last attempt to quit: 12/10/2012  . Years since quitting: 5.5  Smokeless Tobacco Never Used    Labs: Recent Review Advice worker    Labs for ITP Cardiac and Pulmonary Rehab Latest Ref Rng & Units 12/18/2017 12/18/2017   PHART 7.350 - 7.450 - 7.365   PCO2ART 32.0 - 48.0 mmHg - 42.9   HCO3 20.0 - 28.0 mmol/L 25.9 24.5   TCO2 22 - 32 mmol/L 27 26   ACIDBASEDEF 0.0 - 2.0 mmol/L - 1.0   O2SAT % 69.0 99.0      Capillary Blood Glucose: No results found for: GLUCAP   Exercise Target Goals: Exercise Program Goal: Individual exercise prescription set using results from initial 6 min walk test and THRR while considering  patient's activity barriers and safety.   Exercise Prescription Goal: Initial exercise prescription builds to 30-45 minutes a day of aerobic activity, 2-3 days per week.  Home exercise guidelines will be given to patient during program as part of exercise prescription that the participant will acknowledge.  Activity Barriers & Risk Stratification: Activity Barriers & Cardiac Risk Stratification - 05/28/18 0857      Activity Barriers & Cardiac Risk Stratification   Activity Barriers  Other (comment)    Comments  Autoimmune disease-right side gets weak. Joint pain    Cardiac Risk Stratification  High       6 Minute Walk: 6 Minute Walk    Row Name 05/28/18 0950         6 Minute Walk   Phase  Initial      Distance  1487 feet     Walk Time  6 minutes     # of Rest Breaks  0     MPH  2.82     METS  3.55     RPE  12     Perceived Dyspnea   0     VO2 Peak  12.41     Symptoms  Yes (comment)     Comments  Patient c/o bilateral  ankle and foot pain/neuropathy, "pins and needles". Ankle pain "2-3/10" on the pain scale.     Resting HR  81 bpm     Resting BP  120/80     Resting Oxygen Saturation   98 %     Exercise Oxygen Saturation  during 6 min walk  99 %     Max Ex. HR  119 bpm     Max Ex. BP  120/70     2 Minute Post BP  104/74        Oxygen Initial Assessment:   Oxygen Re-Evaluation:   Oxygen Discharge (Final Oxygen Re-Evaluation):   Initial Exercise Prescription: Initial Exercise Prescription - 05/28/18 1000      Date of Initial Exercise RX and Referring Provider   Date  05/28/18    Referring Provider  Truett Mainland, MD    Expected Discharge Date  08/19/18      Recumbant Bike   Level  2    Minutes  10    METs  2.5      NuStep   Level  3    SPM  85    Minutes  10    METs  2.5      Track   Laps  12    Minutes  10    METs  3.09      Prescription Details   Frequency (times per week)  3    Duration  Progress to 30 minutes of continuous aerobic without signs/symptoms of physical distress      Intensity   THRR 40-80% of Max Heartrate  71-142    Ratings of Perceived Exertion  11-13    Perceived Dyspnea  0-4      Progression   Progression  Continue to progress workloads to maintain intensity without signs/symptoms of physical distress.      Resistance Training   Training Prescription  Yes    Weight  3lbs    Reps  10-15       Perform Capillary Blood Glucose checks as needed.  Exercise Prescription Changes: Exercise Prescription Changes    Row Name 06/03/18 1452 06/14/18 1500 07/08/18 1505         Response to Exercise   Blood Pressure (Admit)  98/64  124/82  120/78     Blood Pressure (Exercise)  132/80  142/72  142/80     Blood Pressure (Exit)   108/80  118/78  104/70     Heart Rate (Admit)  86 bpm  98 bpm  94 bpm     Heart Rate (Exercise)  112 bpm  156 bpm  146 bpm     Heart Rate (Exit)  85 bpm  98 bpm  94 bpm     Rating of Perceived Exertion (Exercise)  12  12  13      Symptoms  Parasthesia, both feet, chronic.  Parasthesia, both feet, chronic.  Parasthesia, both feet, chronic.     Duration  Progress to 30 minutes of  aerobic without signs/symptoms of physical distress  Progress to 30 minutes of  aerobic without signs/symptoms of physical distress  Progress to 30 minutes of  aerobic without signs/symptoms of physical distress     Intensity  THRR unchanged  THRR unchanged  THRR unchanged       Progression   Progression  Continue to progress workloads to maintain intensity without signs/symptoms of physical distress.  Continue to progress workloads to maintain intensity without signs/symptoms of physical distress.  Continue to progress  workloads to maintain intensity without signs/symptoms of physical distress.     Average METs  2.6  3.1  3.6       Resistance Training   Training Prescription  Yes  Yes  Yes     Weight  3lbs  3lbs  4lbs     Reps  10-15  10-15  10-15     Time  10 Minutes  10 Minutes  10 Minutes       Interval Training   Interval Training  No  No  No       Recumbant Bike   Level  2  4  -     Minutes  10  10  -     METs  2  2.9  -       NuStep   Level  3  5  6      SPM  85  85  85     Minutes  10  10  10      METs  2.5  2.9  3.1       Rower   Level  -  -  5     Watts  -  -  41     Minutes  -  -  10     METs  -  -  4.56       Track   Laps  7  15  13      Minutes  5  5  10      METs  3.44  3.6  3.26       Home Exercise Plan   Plans to continue exercise at  -  Home (comment) walking and doing yoga at home.  Home (comment) walking and doing yoga at home.     Frequency  -  Add 4 additional days to program exercise sessions.  Add 4 additional days to program exercise sessions.     Initial Home Exercises  Provided  -  06/14/18  06/14/18        Exercise Comments: Exercise Comments    Row Name 06/03/18 1547 06/14/18 1512 07/08/18 1533       Exercise Comments  Patient tolerated 1st session of exercise well, some difficulty with walking due to parasthesia in both feet.  Reviewed home exercise guidelines, METs, and goals with patient.  Reviewed METs and goals with patient.        Exercise Goals and Review: Exercise Goals    Row Name 05/28/18 0857             Exercise Goals   Increase Physical Activity  Yes       Intervention  Provide advice, education, support and counseling about physical activity/exercise needs.;Develop an individualized exercise prescription for aerobic and resistive training based on initial evaluation findings, risk stratification, comorbidities and participant's personal goals.       Expected Outcomes  Short Term: Attend rehab on a regular basis to increase amount of physical activity.;Long Term: Exercising regularly at least 3-5 days a week.;Long Term: Add in home exercise to make exercise part of routine and to increase amount of physical activity.       Increase Strength and Stamina  Yes       Intervention  Provide advice, education, support and counseling about physical activity/exercise needs.;Develop an individualized exercise prescription for aerobic and resistive training based on initial evaluation findings, risk stratification, comorbidities and participant's personal goals.       Expected Outcomes  Short  Term: Increase workloads from initial exercise prescription for resistance, speed, and METs.;Short Term: Perform resistance training exercises routinely during rehab and add in resistance training at home;Long Term: Improve cardiorespiratory fitness, muscular endurance and strength as measured by increased METs and functional capacity ( )       Able to understand and use rate of perceived exertion (RPE) scale  Yes       Intervention  Provide education and  explanation on how to use RPE scale       Expected Outcomes  Short Term: Able to use RPE daily in rehab to express subjective intensity level;Long Term:  Able to use RPE to guide intensity level when exercising independently       Knowledge and understanding of Target Heart Rate Range (THRR)  Yes       Intervention  Provide education and explanation of THRR including how the numbers were predicted and where they are located for reference       Expected Outcomes  Short Term: Able to state/look up THRR;Long Term: Able to use THRR to govern intensity when exercising independently;Short Term: Able to use daily as guideline for intensity in rehab       Able to check pulse independently  Yes       Intervention  Provide education and demonstration on how to check pulse in carotid and radial arteries.;Review the importance of being able to check your own pulse for safety during independent exercise       Expected Outcomes  Short Term: Able to explain why pulse checking is important during independent exercise;Long Term: Able to check pulse independently and accurately       Understanding of Exercise Prescription  Yes       Intervention  Provide education, explanation, and written materials on patient's individual exercise prescription       Expected Outcomes  Short Term: Able to explain program exercise prescription;Long Term: Able to explain home exercise prescription to exercise independently          Exercise Goals Re-Evaluation : Exercise Goals Re-Evaluation    Row Name 06/03/18 1547 06/14/18 1512 07/08/18 1533         Exercise Goal Re-Evaluation   Exercise Goals Review  Increase Physical Activity;Able to understand and use rate of perceived exertion (RPE) scale  Increase Physical Activity;Able to understand and use rate of perceived exertion (RPE) scale;Understanding of Exercise Prescription;Knowledge and understanding of Target Heart Rate Range (THRR)  Increase Physical Activity;Able to understand  and use rate of perceived exertion (RPE) scale;Understanding of Exercise Prescription;Knowledge and understanding of Target Heart Rate Range (THRR)     Comments  Patient able to understand and use RPE scale appropriately. Walking limited due to parasthesia, both feet.  Reviewed home exercise guidelines with patient including THRR, RPE scale, and endpoints for exercise. Pt is walking ~2 miles in 30 minutes, 4 days/week and is doing yoga 20 minutes daily. Walking is limited by joint, feet, and ankle soreness from autoimmune disaese. Pt given [employer] Your [employer]ulse handout.  Patient is making good progress with exercise at cardiac rehab. Pt switched from recumbent bike to rower and tolerated well. Pt continues to walk and do yoga as her mode of home exercise.     Expected Outcomes  Increase workloads as tolerated to help increase strength and stamina and achieve persoanl health and fitness goals.  Continue to progress workloads to improve cardiorespiratory fitness.  Patient will continue daily exercise routine walking and doing yoga at home in addition  to exercise at cardiac rehab.        Discharge Exercise Prescription (Final Exercise Prescription Changes): Exercise Prescription Changes - 07/08/18 1505      Response to Exercise   Blood Pressure (Admit)  120/78    Blood Pressure (Exercise)  142/80    Blood Pressure (Exit)  104/70    Heart Rate (Admit)  94 bpm    Heart Rate (Exercise)  146 bpm    Heart Rate (Exit)  94 bpm    Rating of Perceived Exertion (Exercise)  13    Symptoms  Parasthesia, both feet, chronic.    Duration  Progress to 30 minutes of  aerobic without signs/symptoms of physical distress    Intensity  THRR unchanged      Progression   Progression  Continue to progress workloads to maintain intensity without signs/symptoms of physical distress.    Average METs  3.6      Resistance Training   Training Prescription  Yes    Weight  4lbs    Reps  10-15    Time  10 Minutes       Interval Training   Interval Training  No      Recumbant Bike   Level  --    Minutes  --    METs  --      NuStep   Level  6    SPM  85    Minutes  10    METs  3.1      Rower   Level  5    Watts  41    Minutes  10    METs  4.56      Track   Laps  13    Minutes  10    METs  3.26      Home Exercise Plan   Plans to continue exercise at  Home (comment)   walking and doing yoga at home.   Frequency  Add 4 additional days to program exercise sessions.    Initial Home Exercises Provided  06/14/18       Nutrition:  Target Goals: Understanding of nutrition guidelines, daily intake of sodium 1500mg , cholesterol 200mg , calories 30% from fat and 7% or less from saturated fats, daily to have 5 or more servings of fruits and vegetables.  Biometrics: Pre Biometrics - 05/28/18 0850      Pre Biometrics   Height  5' 5.5" (1.664 m)    Weight  (!) 136.8 kg    Waist Circumference  48.5 inches    Hip Circumference  57.75 inches    Waist to Hip Ratio  0.84 %    BMI (Calculated)  49.41    Triceps Skinfold  61 mm    % Body Fat  57.7 %    Grip Strength  33.5 kg    Flexibility  12.5 in    Single Leg Stand  30 seconds        Nutrition Therapy Plan and Nutrition Goals:   Nutrition Assessments:   Nutrition Goals Re-Evaluation:   Nutrition Goals Re-Evaluation:   Nutrition Goals Discharge (Final Nutrition Goals Re-Evaluation):   Psychosocial: Target Goals: Acknowledge presence or absence of significant depression and/or stress, maximize coping skills, provide positive support system. Participant is able to verbalize types and ability to use techniques and skills needed for reducing stress and depression.  Initial Review & Psychosocial Screening: Initial Psych Review & Screening - 05/28/18 1022      Initial Review   Current  issues with  None Identified      Family Dynamics   Good Support System?  Yes   parents, son, best friend, support group     Barriers    Psychosocial barriers to participate in program  There are no identifiable barriers or psychosocial needs.      Screening Interventions   Interventions  Encouraged to exercise       Quality of Life Scores: Quality of Life - 05/28/18 1022      Quality of Life   Select  Quality of Life      Quality of Life Scores   Health/Function Pre  14.5 %    Socioeconomic Pre  16.8 %    Psych/Spiritual Pre  19.6 %    Family Pre  24.1 %    GLOBAL Pre  17.4 %      Scores of 19 and below usually indicate a poorer quality of life in these areas.  A difference of  2-3 points is a clinically meaningful difference.  A difference of 2-3 points in the total score of the Quality of Life Index has been associated with significant improvement in overall quality of life, self-image, physical symptoms, and general health in studies assessing change in quality of life.  PHQ-9: Recent Review Flowsheet Data    Depression screen [facility] 2/9 06/03/2018   Decreased Interest 0   Down, Depressed, Hopeless 0   PHQ - 2 Score 0     Interpretation of Total Score  Total Score Depression Severity:  1-4 = Minimal depression, 5-9 = Mild depression, 10-14 = Moderate depression, 15-19 = Moderately severe depression, 20-27 = Severe depression   Psychosocial Evaluation and Intervention:   Psychosocial Re-Evaluation: Psychosocial Re-Evaluation    Row Name 06/20/18 1506 07/11/18 1513           Psychosocial Re-Evaluation   Current issues with  Current Stress Concerns  Current Stress Concerns      Comments  Sarah English does have some health related concerns. Will continue to offer support as needed  Sarah English does have some health related concerns. Will continue to offer support as needed      Expected Outcomes  Sarah English will have decreased stress nupon discharge from cardiac rehab  Sarah English will have decreased stress upon discharge from cardiac rehab      Interventions  Encouraged to attend Cardiac Rehabilitation for the  exercise;Stress management education  Encouraged to attend Cardiac Rehabilitation for the exercise;Stress management education      Continue Psychosocial Services   -  Follow up required by staff        Initial Review   Source of Stress Concerns  Chronic Illness  Chronic Illness         Psychosocial Discharge (Final Psychosocial Re-Evaluation): Psychosocial Re-Evaluation - 07/11/18 1513      Psychosocial Re-Evaluation   Current issues with  Current Stress Concerns    Comments  Sarah English does have some health related concerns. Will continue to offer support as needed    Expected Outcomes  Sarah English will have decreased stress upon discharge from cardiac rehab    Interventions  Encouraged to attend Cardiac Rehabilitation for the exercise;Stress management education    Continue Psychosocial Services   Follow up required by staff      Initial Review   Source of Stress Concerns  Chronic Illness       Vocational Rehabilitation: Provide vocational rehab assistance to qualifying candidates.   Vocational Rehab Evaluation & Intervention: Vocational Rehab - 06/04/18  1549      Vocational Rehab Re-Evaulation   Comments  Vocational rehab formed faxed to Vocational Rehab office       Education: Education Goals: Education classes will be provided on a weekly basis, covering required topics. Participant will state understanding/return demonstration of topics presented.  Learning Barriers/Preferences: Learning Barriers/Preferences - 05/27/18 1552      Learning Barriers/Preferences   Learning Barriers  Sight    Learning Preferences  Written Material;Verbal Instruction;Video;Group Instruction       Education Topics: Count Your Pulse:  -Group instruction provided by verbal instruction, demonstration, patient participation and written materials to support subject.  Instructors address importance of being able to find your pulse and how to count your pulse when at home without a heart monitor.   Patients get hands on experience counting their pulse with staff help and individually.   Heart Attack, Angina, and Risk Factor Modification:  -Group instruction provided by verbal instruction, video, and written materials to support subject.  Instructors address signs and symptoms of angina and heart attacks.    Also discuss risk factors for heart disease and how to make changes to improve heart health risk factors.   Functional Fitness:  -Group instruction provided by verbal instruction, demonstration, patient participation, and written materials to support subject.  Instructors address safety measures for doing things around the house.  Discuss how to get up and down off the floor, how to pick things up properly, how to safely get out of a chair without assistance, and balance training.   Meditation and Mindfulness:  -Group instruction provided by verbal instruction, patient participation, and written materials to support subject.  Instructor addresses importance of mindfulness and meditation practice to help reduce stress and improve awareness.  Instructor also leads participants through a meditation exercise.    Stretching for Flexibility and Mobility:  -Group instruction provided by verbal instruction, patient participation, and written materials to support subject.  Instructors lead participants through series of stretches that are designed to increase flexibility thus improving mobility.  These stretches are additional exercise for major muscle groups that are typically performed during regular warm up and cool down.   Hands Only CPR:  -Group verbal, video, and participation provides a basic overview of AHA guidelines for community CPR. Role-play of emergencies allow participants the opportunity to practice calling for help and chest compression technique with discussion of AED use.   Hypertension: -Group verbal and written instruction that provides a basic overview of hypertension  including the most recent diagnostic guidelines, risk factor reduction with self-care instructions and medication management.    Nutrition I class: Heart Healthy Eating:  -Group instruction provided by PowerPoint slides, verbal discussion, and written materials to support subject matter. The instructor gives an explanation and review of the Therapeutic Lifestyle Changes diet recommendations, which includes a discussion on lipid goals, dietary fat, sodium, fiber, plant stanol/sterol esters, sugar, and the components of a well-balanced, healthy diet.   Nutrition II class: Lifestyle Skills:  -Group instruction provided by PowerPoint slides, verbal discussion, and written materials to support subject matter. The instructor gives an explanation and review of label reading, grocery shopping for heart health, heart healthy recipe modifications, and ways to make healthier choices when eating out.   Diabetes Question & Answer:  -Group instruction provided by PowerPoint slides, verbal discussion, and written materials to support subject matter. The instructor gives an explanation and review of diabetes co-morbidities, pre- and post-prandial blood glucose goals, pre-exercise blood glucose goals, signs, symptoms, and  treatment of hypoglycemia and hyperglycemia, and foot care basics.   Diabetes Blitz:  -Group instruction provided by PowerPoint slides, verbal discussion, and written materials to support subject matter. The instructor gives an explanation and review of the physiology behind type 1 and type 2 diabetes, diabetes medications and rational behind using different medications, pre- and post-prandial blood glucose recommendations and Hemoglobin A1c goals, diabetes diet, and exercise including blood glucose guidelines for exercising safely.    Portion Distortion:  -Group instruction provided by PowerPoint slides, verbal discussion, written materials, and food models to support subject matter. The  instructor gives an explanation of serving size versus portion size, changes in portions sizes over the last 20 years, and what consists of a serving from each food group.   Stress Management:  -Group instruction provided by verbal instruction, video, and written materials to support subject matter.  Instructors review role of stress in heart disease and how to cope with stress positively.     Exercising on Your Own:  -Group instruction provided by verbal instruction, power point, and written materials to support subject.  Instructors discuss benefits of exercise, components of exercise, frequency and intensity of exercise, and end points for exercise.  Also discuss use of nitroglycerin and activating EMS.  Review options of places to exercise outside of rehab.  Review guidelines for sex with heart disease.   Cardiac Drugs I:  -Group instruction provided by verbal instruction and written materials to support subject.  Instructor reviews cardiac drug classes: antiplatelets, anticoagulants, beta blockers, and statins.  Instructor discusses reasons, side effects, and lifestyle considerations for each drug class.   Cardiac Drugs II:  -Group instruction provided by verbal instruction and written materials to support subject.  Instructor reviews cardiac drug classes: angiotensin converting enzyme inhibitors (ACE-I), angiotensin II receptor blockers (ARBs), nitrates, and calcium channel blockers.  Instructor discusses reasons, side effects, and lifestyle considerations for each drug class.   Anatomy and Physiology of the Circulatory System:  Group verbal and written instruction and models provide basic cardiac anatomy and physiology, with the coronary electrical and arterial systems. Review of: AMI, Angina, Valve disease, Heart Failure, Peripheral Artery Disease, Cardiac Arrhythmia, Pacemakers, and the ICD.   Other Education:  -Group or individual verbal, written, or video instructions that support  the educational goals of the cardiac rehab program.   Holiday Eating Survival Tips:  -Group instruction provided by PowerPoint slides, verbal discussion, and written materials to support subject matter. The instructor gives patients tips, tricks, and techniques to help them not only survive but enjoy the holidays despite the onslaught of food that accompanies the holidays.   Knowledge Questionnaire Score: Knowledge Questionnaire Score - 05/28/18 1023      Knowledge Questionnaire Score   Pre Score  24/24       Core Components/Risk Factors/Patient Goals at Admission: Personal Goals and Risk Factors at Admission - 05/28/18 1121      Core Components/Risk Factors/Patient Goals on Admission    Weight Management  Yes;Obesity    Intervention  Weight Management/Obesity: Establish reasonable short term and long term weight goals.;Obesity: Provide education and appropriate resources to help participant work on and attain dietary goals.    Admit Weight  301 lb 9.4 oz (136.8 kg)    Expected Outcomes  Short Term: Continue to assess and modify interventions until short term weight is achieved;Long Term: Adherence to nutrition and physical activity/exercise program aimed toward attainment of established weight goal;Weight Loss: Understanding of general recommendations for a balanced deficit meal  plan, which promotes 1-2 lb weight loss per week and includes a negative energy balance of [phone] kcal/d;Understanding recommendations for meals to include 15-35% energy as protein, 25-35% energy from fat, 35-60% energy from carbohydrates, less than 200mg  of dietary cholesterol, 20-35 gm of total fiber daily;Understanding of distribution of calorie intake throughout the day with the consumption of 4-5 meals/snacks    Hypertension  Yes    Intervention  Provide education on lifestyle modifcations including regular physical activity/exercise, weight management, moderate sodium restriction and increased consumption of  fresh fruit, vegetables, and low fat dairy, alcohol moderation, and smoking cessation.;Monitor prescription use compliance.    Expected Outcomes  Short Term: Continued assessment and intervention until BP is < 140/28mm HG in hypertensive participants. < 130/69mm HG in hypertensive participants with diabetes, heart failure or chronic kidney disease.;Long Term: Maintenance of blood pressure at goal levels.    Stress  Yes    Intervention  Offer individual and/or small group education and counseling on adjustment to heart disease, stress management and health-related lifestyle change. Teach and support self-help strategies.;Refer participants experiencing significant psychosocial distress to appropriate mental health specialists for further evaluation and treatment. When possible, include family members and significant others in education/counseling sessions.    Expected Outcomes  Short Term: Participant demonstrates changes in health-related behavior, relaxation and other stress management skills, ability to obtain effective social support, and compliance with psychotropic medications if prescribed.;Long Term: Emotional wellbeing is indicated by absence of clinically significant psychosocial distress or social isolation.       Core Components/Risk Factors/Patient Goals Review:  Goals and Risk Factor Review    Row Name 06/20/18 1508 07/11/18 1514           Core Components/Risk Factors/Patient Goals Review   Personal Goals Review  Weight Management/Obesity;Hypertension;Stress  Weight Management/Obesity;Hypertension;Stress      Review  Sarah English's vital signs have been stable at phase 2 cardiac rehab. Will continue to monitor stressors and offer support as needed  Sarah English's vital signs have been stable at phase 2 cardiac rehab. Will continue to monitor stressors and offer support as needed      Expected Outcomes  Sarah English will continue to participate in phase 2 cardiac rehab for exercise, follow nutrtion  and lifestyle modfication opportunities.  Sarah English will continue to participate in phase 2 cardiac rehab for exercise, follow nutrtion and lifestyle modfication opportunities.         Core Components/Risk Factors/Patient Goals at Discharge (Final Review):  Goals and Risk Factor Review - 07/11/18 1514      Core Components/Risk Factors/Patient Goals Review   Personal Goals Review  Weight Management/Obesity;Hypertension;Stress    Review  Sarah English's vital signs have been stable at phase 2 cardiac rehab. Will continue to monitor stressors and offer support as needed    Expected Outcomes  Sarah English will continue to participate in phase 2 cardiac rehab for exercise, follow nutrtion and lifestyle modfication opportunities.       ITP Comments: ITP Comments    Row Name 05/27/18 1549 06/20/18 1504 07/11/18 1512       ITP Comments  Dr. Armanda Magic, Medical Director   30 Day ITP Review. Patient with good atendance and participation in phase 2 cardiac rehab.  30 Day ITP Review. Patient with good atendance and participation in phase 2 cardiac rehab. Sarah English has had some abscences due to non cardiac issues        Comments: See ITP comments.Gladstone Lighter, RN,BSN 07/11/2018 3:17 PM

## 2018-07-12 ENCOUNTER — Telehealth (HOSPITAL_COMMUNITY): Payer: Self-pay | Admitting: Family Medicine

## 2018-07-12 ENCOUNTER — Encounter (HOSPITAL_COMMUNITY): Payer: Medicaid Other

## 2018-07-15 ENCOUNTER — Encounter (HOSPITAL_COMMUNITY): Payer: Medicaid Other

## 2018-07-15 ENCOUNTER — Encounter (HOSPITAL_COMMUNITY)
Admission: RE | Admit: 2018-07-15 | Discharge: 2018-07-15 | Disposition: A | Discharge status: Home / Self Care | Payer: Medicaid Other | Source: Ambulatory Visit | Attending: Cardiology | Admitting: Cardiology

## 2018-07-15 DIAGNOSIS — I420 Dilated cardiomyopathy: Secondary | ICD-10-CM | POA: Diagnosis not present

## 2018-07-15 DIAGNOSIS — I5022 Chronic systolic (congestive) heart failure: Secondary | ICD-10-CM

## 2018-07-17 ENCOUNTER — Encounter (HOSPITAL_COMMUNITY): Payer: Medicaid Other

## 2018-07-19 ENCOUNTER — Encounter (HOSPITAL_COMMUNITY): Payer: Medicaid Other

## 2018-07-22 ENCOUNTER — Encounter (HOSPITAL_COMMUNITY): Payer: Medicaid Other

## 2018-07-24 ENCOUNTER — Encounter (HOSPITAL_COMMUNITY): Payer: Medicaid Other

## 2018-07-24 ENCOUNTER — Encounter (HOSPITAL_COMMUNITY)
Admission: RE | Admit: 2018-07-24 | Discharge: 2018-07-24 | Disposition: A | Discharge status: Home / Self Care | Payer: Medicaid Other | Source: Ambulatory Visit | Attending: Cardiology | Admitting: Cardiology

## 2018-07-24 DIAGNOSIS — I420 Dilated cardiomyopathy: Secondary | ICD-10-CM | POA: Insufficient documentation

## 2018-07-24 DIAGNOSIS — I5022 Chronic systolic (congestive) heart failure: Secondary | ICD-10-CM

## 2018-07-26 ENCOUNTER — Encounter (HOSPITAL_COMMUNITY): Payer: Medicaid Other

## 2018-07-26 ENCOUNTER — Encounter (HOSPITAL_COMMUNITY)
Admission: RE | Admit: 2018-07-26 | Discharge: 2018-07-26 | Disposition: A | Discharge status: Home / Self Care | Payer: Medicaid Other | Source: Ambulatory Visit | Attending: Cardiology | Admitting: Cardiology

## 2018-07-26 DIAGNOSIS — I5022 Chronic systolic (congestive) heart failure: Secondary | ICD-10-CM

## 2018-07-26 DIAGNOSIS — I420 Dilated cardiomyopathy: Secondary | ICD-10-CM | POA: Diagnosis not present

## 2018-07-29 ENCOUNTER — Encounter (HOSPITAL_COMMUNITY): Payer: Medicaid Other

## 2018-07-29 ENCOUNTER — Encounter (HOSPITAL_COMMUNITY)
Admission: RE | Admit: 2018-07-29 | Discharge: 2018-07-29 | Disposition: A | Discharge status: Home / Self Care | Payer: Medicaid Other | Source: Ambulatory Visit | Attending: Cardiology | Admitting: Cardiology

## 2018-07-29 DIAGNOSIS — I420 Dilated cardiomyopathy: Secondary | ICD-10-CM | POA: Diagnosis not present

## 2018-07-29 DIAGNOSIS — I5022 Chronic systolic (congestive) heart failure: Secondary | ICD-10-CM

## 2018-07-31 ENCOUNTER — Encounter (HOSPITAL_COMMUNITY): Payer: Medicaid Other

## 2018-07-31 ENCOUNTER — Encounter (HOSPITAL_COMMUNITY)
Admission: RE | Admit: 2018-07-31 | Discharge: 2018-07-31 | Disposition: A | Discharge status: Home / Self Care | Payer: Medicaid Other | Source: Ambulatory Visit | Attending: Cardiology | Admitting: Cardiology

## 2018-07-31 DIAGNOSIS — I420 Dilated cardiomyopathy: Secondary | ICD-10-CM | POA: Diagnosis not present

## 2018-07-31 DIAGNOSIS — I5022 Chronic systolic (congestive) heart failure: Secondary | ICD-10-CM

## 2018-08-01 NOTE — Progress Notes (Signed)
Cardiac Individual Treatment Plan  Patient Details  Name: Sarah English MRN: [id number] Date of Birth: Dec 10, 1975 Referring Provider:     CARDIAC REHAB PHASE II ORIENTATION from 05/28/2018 in [facility] CARDIAC REHAB  Referring Provider  Truett Mainland, MD      Initial Encounter Date:    CARDIAC REHAB PHASE II ORIENTATION from 05/28/2018 in [facility] CARDIAC REHAB  Date  05/28/18      Visit Diagnosis: 2019 Chronic systolic congestive heart failure (HCC)   Patient's Home Medications on Admission:  Current Outpatient Medications:  .  albuterol (PROVENTIL HFA;VENTOLIN HFA) 108 (90 Base) MCG/ACT inhaler, Inhale into the lungs every 6 (six) hours as needed for wheezing or shortness of breath., Disp: , Rfl:  .  Aloe-Sodium Chloride (AYR SALINE NASAL GEL NA), Place 1 spray into the nose daily., Disp: , Rfl:  .  antiseptic oral rinse (BIOTENE) LIQD, 1-2 application by Mouth Rinse route every hour as needed for dry mouth., Disp: , Rfl:  .  CAMILA 0.35 MG tablet, Take 1 tablet by mouth at bedtime. , Disp: , Rfl: 1 .  CORLANOR 5 MG TABS tablet, Take 5 mg by mouth 2 (two) times daily., Disp: , Rfl: 2 .  cyclobenzaprine (FLEXERIL) 10 MG tablet, Take 10 mg by mouth 3 (three) times daily as needed for muscle spasms., Disp: , Rfl:  .  cycloSPORINE (RESTASIS) 0.05 % ophthalmic emulsion, Place 1 drop into both eyes 2 (two) times daily. , Disp: , Rfl:  .  etonogestrel-ethinyl estradiol (NUVARING) 0.12-0.015 MG/24HR vaginal ring, Place 1 each vaginally every 28 (twenty-eight) days. Insert vaginally and leave in place for 3 consecutive weeks, then remove for 1 week., Disp: , Rfl:  .  furosemide (LASIX) 20 MG tablet, Take 20 mg by mouth daily. , Disp: , Rfl:  .  gabapentin (NEURONTIN) 300 MG capsule, Take 1 capsule (300 mg total) by mouth 3 (three) times daily., Disp: 90 capsule, Rfl: 5 .  griseofulvin (GRIFULVIN V) 500 MG tablet, Take 1 tablet by mouth daily. For 4 to  6 weeks, Disp: , Rfl: 1 .  HYDROcodone-acetaminophen (NORCO) 5-325 MG tablet, Take 1 tablet by mouth every 6 (six) hours as needed for severe pain. (Patient not taking: Reported on 06/03/2018), Disp: 30 tablet, Rfl: 0 .  Lactobacillus Rhamnosus, GG, (CULTURELLE PROBIOTICS KIDS PO), Take 1 tablet by mouth daily., Disp: , Rfl:  .  metoprolol succinate (TOPROL-XL) 25 MG 24 hr tablet, Take 25 mg by mouth daily., Disp: , Rfl: 2 .  omeprazole (PRILOSEC) 20 MG capsule, Take 20 mg by mouth daily., Disp: , Rfl:  .  sacubitril-valsartan (ENTRESTO) 49-51 MG, Take 1 tablet by mouth 2 (two) times daily., Disp: , Rfl:  .  selenium (SELENIMIN-200) 200 MCG TABS tablet, Take 200 mcg by mouth daily. , Disp: , Rfl:  .  silver sulfADIAZINE (SILVADENE) 1 % cream, Apply 1 application topically daily as needed (PAIN)., Disp: , Rfl:  .  spironolactone (ALDACTONE) 25 MG tablet, Take 12.5 mg by mouth daily., Disp: , Rfl:  .  SUMAtriptan (IMITREX) 100 MG tablet, Take 100 mg by mouth every 6 (six) hours as needed for migraine. May repeat in 2 hours if headache persists or recurs., Disp: , Rfl:  .  traMADol (ULTRAM) 50 MG tablet, Take 50 mg by mouth 2 (two) times daily as needed for moderate pain. , Disp: , Rfl:   Past Medical History: Past Medical History:  Diagnosis Date  . Anemia   .  Autoimmune disorder (HCC)   . Bronchitis    hx -uses inhaler prn  . Chronic kidney disease    kidney infections  . Endometriosis   . Fibromyalgia   . GERD (gastroesophageal reflux disease)    occasional  . Hashimoto thyroiditis, fibrous variant   . Hashimoto's disease   . Headache    migraines  . History of kidney stones   . Hypertension   . IVCD (intraventricular conduction defect)   . LBBB (left bundle branch block)   . Nonischemic cardiomyopathy (HCC)    per stress test 12-17-2017  ef 14%;  per echo 12-13-2017 ef 21%  . Peripheral vascular disease (HCC)    raynoids syndrome  . Pneumonia    only x 1 - 2- 3 months ago  .  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 Scleroderma (HCC)   . Sjogren's syndrome (HCC)   . Sleep apnea    recently dx 04/13/18, does not have CPAP machine yet.  . Systemic sclerosis (HCC)   . Systolic CHF with reduced left ventricular function, NYHA class 3 [facility])    cardiologist-  dr Evlyn Clines patwardhan  . Tendinitis    lower back  . Thyroiditis     Tobacco Use: Social History   Tobacco Use  Smoking Status Former Smoker  . Packs/day: 0.50  . Years: 8.00  . Pack years: 4.00  . Types: Cigarettes  . Last attempt to quit: 12/10/2012  . Years since quitting: 5.6  Smokeless Tobacco Never Used    Labs: Recent Review Advice worker    Labs for ITP Cardiac and Pulmonary Rehab Latest Ref Rng & Units 12/18/2017 12/18/2017   PHART 7.350 - 7.450 - 7.365   PCO2ART 32.0 - 48.0 mmHg - 42.9   HCO3 20.0 - 28.0 mmol/L 25.9 24.5   TCO2 22 - 32 mmol/L 27 26   ACIDBASEDEF 0.0 - 2.0 mmol/L - 1.0   O2SAT % 69.0 99.0      Capillary Blood Glucose: No results found for: GLUCAP   Exercise Target Goals: Exercise Program Goal: Individual exercise prescription set using results from initial 6 min walk test and THRR while considering  patient's activity barriers and safety.   Exercise Prescription Goal: Initial exercise prescription builds to 30-45 minutes a day of aerobic activity, 2-3 days per week.  Home exercise guidelines will be given to patient during program as part of exercise prescription that the participant will acknowledge.  Activity Barriers & Risk Stratification: Activity Barriers & Cardiac Risk Stratification - 05/28/18 0857      Activity Barriers & Cardiac Risk Stratification   Activity Barriers  Other (comment)    Comments  Autoimmune disease-right side gets weak. Joint pain    Cardiac Risk Stratification  High       6 Minute Walk: 6 Minute Walk    Row Name 05/28/18 0950         6 Minute Walk   Phase  Initial      Distance  1487 feet     Walk Time  6 minutes     # of Rest Breaks  0     MPH  2.82     METS  3.55     RPE  12     Perceived Dyspnea   0     VO2 Peak  12.41     Symptoms  Yes (comment)     Comments  Patient c/o bilateral  ankle and foot pain/neuropathy, "pins and needles". Ankle pain "2-3/10" on the pain scale.     Resting HR  81 bpm     Resting BP  120/80     Resting Oxygen Saturation   98 %     Exercise Oxygen Saturation  during 6 min walk  99 %     Max Ex. HR  119 bpm     Max Ex. BP  120/70     2 Minute Post BP  104/74        Oxygen Initial Assessment:   Oxygen Re-Evaluation:   Oxygen Discharge (Final Oxygen Re-Evaluation):   Initial Exercise Prescription: Initial Exercise Prescription - 05/28/18 1000      Date of Initial Exercise RX and Referring Provider   Date  05/28/18    Referring Provider  Truett Mainland, MD    Expected Discharge Date  08/19/18      Recumbant Bike   Level  2    Minutes  10    METs  2.5      NuStep   Level  3    SPM  85    Minutes  10    METs  2.5      Track   Laps  12    Minutes  10    METs  3.09      Prescription Details   Frequency (times per week)  3    Duration  Progress to 30 minutes of continuous aerobic without signs/symptoms of physical distress      Intensity   THRR 40-80% of Max Heartrate  71-142    Ratings of Perceived Exertion  11-13    Perceived Dyspnea  0-4      Progression   Progression  Continue to progress workloads to maintain intensity without signs/symptoms of physical distress.      Resistance Training   Training Prescription  Yes    Weight  3lbs    Reps  10-15       Perform Capillary Blood Glucose checks as needed.  Exercise Prescription Changes: Exercise Prescription Changes    Row Name 06/03/18 1452 06/14/18 1500 07/08/18 1505 07/24/18 1505       Response to Exercise   Blood Pressure (Admit)  98/64  124/82  120/78  122/80    Blood Pressure (Exercise)  132/80  142/72  142/80  120/70     Blood Pressure (Exit)  108/80  118/78  104/70  120/70    Heart Rate (Admit)  86 bpm  98 bpm  94 bpm  91 bpm    Heart Rate (Exercise)  112 bpm  156 bpm  146 bpm  156 bpm    Heart Rate (Exit)  85 bpm  98 bpm  94 bpm  100 bpm    Rating of Perceived Exertion (Exercise)  12  12  13  13     Symptoms  Parasthesia, both feet, chronic.  Parasthesia, both feet, chronic.  Parasthesia, both feet, chronic.  Parasthesia, both feet, chronic.    Duration  Progress to 30 minutes of  aerobic without signs/symptoms of physical distress  Progress to 30 minutes of  aerobic without signs/symptoms of physical distress  Progress to 30 minutes of  aerobic without signs/symptoms of physical distress  Progress to 30 minutes of  aerobic without signs/symptoms of physical distress    Intensity  THRR unchanged  THRR unchanged  THRR unchanged  THRR unchanged      Progression   Progression  Continue  to progress workloads to maintain intensity without signs/symptoms of physical distress.  Continue to progress workloads to maintain intensity without signs/symptoms of physical distress.  Continue to progress workloads to maintain intensity without signs/symptoms of physical distress.  Continue to progress workloads to maintain intensity without signs/symptoms of physical distress.    Average METs  2.6  3.1  3.6  3.6      Resistance Training   Training Prescription  Yes  Yes  Yes  No Relaxation day, no weights    Weight  3lbs  3lbs  4lbs  -    Reps  10-15  10-15  10-15  -    Time  10 Minutes  10 Minutes  10 Minutes  -      Interval Training   Interval Training  No  No  No  No      Recumbant Bike   Level  2  4  -  -    Minutes  10  10  -  -    METs  2  2.9  -  -      NuStep   Level  3  5  6  6     SPM  85  85  85  85    Minutes  10  10  10  10     METs  2.5  2.9  3.1  3.3      Rower   Level  -  -  5  5    Watts  -  -  41  45    Minutes  -  -  10  10    METs  -  -  4.56  4.66      Track   Laps  7  15  13  14      Minutes  5  5  10  10     METs  3.44  3.6  3.26  3.44      Home Exercise Plan   Plans to continue exercise at  -  Home (comment) walking and doing yoga at home.  Home (comment) walking and doing yoga at home.  Home (comment) walking and doing yoga at home.    Frequency  -  Add 4 additional days to program exercise sessions.  Add 4 additional days to program exercise sessions.  Add 4 additional days to program exercise sessions.    Initial Home Exercises Provided  -  06/14/18  06/14/18  06/14/18       Exercise Comments: Exercise Comments    Row Name 06/03/18 1547 06/14/18 1512 07/08/18 1533 07/24/18 1530     Exercise Comments  Patient tolerated 1st session of exercise well, some difficulty with walking due to parasthesia in both feet.  Reviewed home exercise guidelines, METs, and goals with patient.  Reviewed METs and goals with patient.  Reviewed METs with patient.       Exercise Goals and Review: Exercise Goals    Row Name 05/28/18 0857             Exercise Goals   Increase Physical Activity  Yes       Intervention  Provide advice, education, support and counseling about physical activity/exercise needs.;Develop an individualized exercise prescription for aerobic and resistive training based on initial evaluation findings, risk stratification, comorbidities and participant's personal goals.       Expected Outcomes  Short Term: Attend rehab on a regular basis to increase amount of physical activity.;Long Term: Exercising regularly  at least 3-5 days a week.;Long Term: Add in home exercise to make exercise part of routine and to increase amount of physical activity.       Increase Strength and Stamina  Yes       Intervention  Provide advice, education, support and counseling about physical activity/exercise needs.;Develop an individualized exercise prescription for aerobic and resistive training based on initial evaluation findings, risk stratification, comorbidities and participant's  personal goals.       Expected Outcomes  Short Term: Increase workloads from initial exercise prescription for resistance, speed, and METs.;Short Term: Perform resistance training exercises routinely during rehab and add in resistance training at home;Long Term: Improve cardiorespiratory fitness, muscular endurance and strength as measured by increased METs and functional capacity ( )       Able to understand and use rate of perceived exertion (RPE) scale  Yes       Intervention  Provide education and explanation on how to use RPE scale       Expected Outcomes  Short Term: Able to use RPE daily in rehab to express subjective intensity level;Long Term:  Able to use RPE to guide intensity level when exercising independently       Knowledge and understanding of Target Heart Rate Range (THRR)  Yes       Intervention  Provide education and explanation of THRR including how the numbers were predicted and where they are located for reference       Expected Outcomes  Short Term: Able to state/look up THRR;Long Term: Able to use THRR to govern intensity when exercising independently;Short Term: Able to use daily as guideline for intensity in rehab       Able to check pulse independently  Yes       Intervention  Provide education and demonstration on how to check pulse in carotid and radial arteries.;Review the importance of being able to check your own pulse for safety during independent exercise       Expected Outcomes  Short Term: Able to explain why pulse checking is important during independent exercise;Long Term: Able to check pulse independently and accurately       Understanding of Exercise Prescription  Yes       Intervention  Provide education, explanation, and written materials on patient's individual exercise prescription       Expected Outcomes  Short Term: Able to explain program exercise prescription;Long Term: Able to explain home exercise prescription to exercise independently           Exercise Goals Re-Evaluation : Exercise Goals Re-Evaluation    Row Name 06/03/18 1547 06/14/18 1512 07/08/18 1533         Exercise Goal Re-Evaluation   Exercise Goals Review  Increase Physical Activity;Able to understand and use rate of perceived exertion (RPE) scale  Increase Physical Activity;Able to understand and use rate of perceived exertion (RPE) scale;Understanding of Exercise Prescription;Knowledge and understanding of Target Heart Rate Range (THRR)  Increase Physical Activity;Able to understand and use rate of perceived exertion (RPE) scale;Understanding of Exercise Prescription;Knowledge and understanding of Target Heart Rate Range (THRR)     Comments  Patient able to understand and use RPE scale appropriately. Walking limited due to parasthesia, both feet.  Reviewed home exercise guidelines with patient including THRR, RPE scale, and endpoints for exercise. Pt is walking ~2 miles in 30 minutes, 4 days/week and is doing yoga 20 minutes daily. Walking is limited by joint, feet, and ankle soreness from autoimmune disaese. Pt given [employer] handout.  Patient is making good progress with exercise at cardiac rehab. Pt switched from recumbent bike to rower and tolerated well. Pt continues to walk and do yoga as her mode of home exercise.     Expected Outcomes  Increase workloads as tolerated to help increase strength and stamina and achieve persoanl health and fitness goals.  Continue to progress workloads to improve cardiorespiratory fitness.  Patient will continue daily exercise routine walking and doing yoga at home in addition to exercise at cardiac rehab.        Discharge Exercise Prescription (Final Exercise Prescription Changes): Exercise Prescription Changes - 07/24/18 1505      Response to Exercise   Blood Pressure (Admit)  122/80    Blood Pressure (Exercise)  120/70    Blood Pressure (Exit)  120/70    Heart Rate (Admit)  91 bpm    Heart Rate (Exercise)  156 bpm     Heart Rate (Exit)  100 bpm    Rating of Perceived Exertion (Exercise)  13    Symptoms  Parasthesia, both feet, chronic.    Duration  Progress to 30 minutes of  aerobic without signs/symptoms of physical distress    Intensity  THRR unchanged      Progression   Progression  Continue to progress workloads to maintain intensity without signs/symptoms of physical distress.    Average METs  3.6      Resistance Training   Training Prescription  No   Relaxation day, no weights   Weight  --    Reps  --    Time  --      Interval Training   Interval Training  No      NuStep   Level  6    SPM  85    Minutes  10    METs  3.3      Rower   Level  5    Watts  45    Minutes  10    METs  4.66      Track   Laps  14    Minutes  10    METs  3.44      Home Exercise Plan   Plans to continue exercise at  Home (comment)   walking and doing yoga at home.   Frequency  Add 4 additional days to program exercise sessions.    Initial Home Exercises Provided  06/14/18       Nutrition:  Target Goals: Understanding of nutrition guidelines, daily intake of sodium 1500mg , cholesterol 200mg , calories 30% from fat and 7% or less from saturated fats, daily to have 5 or more servings of fruits and vegetables.  Biometrics: Pre Biometrics - 05/28/18 0850      Pre Biometrics   Height  5' 5.5" (1.664 m)    Weight  (!) 136.8 kg    Waist Circumference  48.5 inches    Hip Circumference  57.75 inches    Waist to Hip Ratio  0.84 %    BMI (Calculated)  49.41    Triceps Skinfold  61 mm    % Body Fat  57.7 %    Grip Strength  33.5 kg    Flexibility  12.5 in    Single Leg Stand  30 seconds        Nutrition Therapy Plan and Nutrition Goals:   Nutrition Assessments:   Nutrition Goals Re-Evaluation:   Nutrition Goals Re-Evaluation:   Nutrition Goals Discharge (Final Nutrition Goals  Re-Evaluation):   Psychosocial: Target Goals: Acknowledge presence or absence of significant depression  and/or stress, maximize coping skills, provide positive support system. Participant is able to verbalize types and ability to use techniques and skills needed for reducing stress and depression.  Initial Review & Psychosocial Screening: Initial Psych Review & Screening - 05/28/18 1022      Initial Review   Current issues with  None Identified      Family Dynamics   Good Support System?  Yes   parents, son, best friend, support group     Barriers   Psychosocial barriers to participate in program  There are no identifiable barriers or psychosocial needs.      Screening Interventions   Interventions  Encouraged to exercise       Quality of Life Scores: Quality of Life - 05/28/18 1022      Quality of Life   Select  Quality of Life      Quality of Life Scores   Health/Function Pre  14.5 %    Socioeconomic Pre  16.8 %    Psych/Spiritual Pre  19.6 %    Family Pre  24.1 %    GLOBAL Pre  17.4 %      Scores of 19 and below usually indicate a poorer quality of life in these areas.  A difference of  2-3 points is a clinically meaningful difference.  A difference of 2-3 points in the total score of the Quality of Life Index has been associated with significant improvement in overall quality of life, self-image, physical symptoms, and general health in studies assessing change in quality of life.  PHQ-9: Recent Review Flowsheet Data    Depression screen [facility] 2/9 06/03/2018   Decreased Interest 0   Down, Depressed, Hopeless 0   PHQ - 2 Score 0     Interpretation of Total Score  Total Score Depression Severity:  1-4 = Minimal depression, 5-9 = Mild depression, 10-14 = Moderate depression, 15-19 = Moderately severe depression, 20-27 = Severe depression   Psychosocial Evaluation and Intervention:   Psychosocial Re-Evaluation: Psychosocial Re-Evaluation    Row Name 06/20/18 1506 07/11/18 1513 08/01/18 1523         Psychosocial Re-Evaluation   Current issues with  Current Stress  Concerns  Current Stress Concerns  Current Stress Concerns     Comments  Sarah English does have some health related concerns. Will continue to offer support as needed  Sarah English does have some health related concerns. Will continue to offer support as needed  Sarah English does have some health related concerns. Will continue to offer support as needed     Expected Outcomes  Sarah English will have decreased stress nupon discharge from cardiac rehab  Sarah English will have decreased stress upon discharge from cardiac rehab  Sarah English will have decreased stress upon discharge from cardiac rehab     Interventions  Encouraged to attend Cardiac Rehabilitation for the exercise;Stress management education  Encouraged to attend Cardiac Rehabilitation for the exercise;Stress management education  Encouraged to attend Cardiac Rehabilitation for the exercise;Stress management education     Continue Psychosocial Services   -  Follow up required by staff  Follow up required by staff       Initial Review   Source of Stress Concerns  Chronic Illness  Chronic Illness  Chronic Illness        Psychosocial Discharge (Final Psychosocial Re-Evaluation): Psychosocial Re-Evaluation - 08/01/18 1523      Psychosocial Re-Evaluation   Current issues with  Current Stress Concerns    Comments  Sarah English does have some health related concerns. Will continue to offer support as needed    Expected Outcomes  Sarah English will have decreased stress upon discharge from cardiac rehab    Interventions  Encouraged to attend Cardiac Rehabilitation for the exercise;Stress management education    Continue Psychosocial Services   Follow up required by staff      Initial Review   Source of Stress Concerns  Chronic Illness       Vocational Rehabilitation: Provide vocational rehab assistance to qualifying candidates.   Vocational Rehab Evaluation & Intervention: Vocational Rehab - 08/01/18 1523      Vocational Rehab Re-Evaulation   Comments  Sarah English has had  an initial appointment with vocational rehab       Education: Education Goals: Education classes will be provided on a weekly basis, covering required topics. Participant will state understanding/return demonstration of topics presented.  Learning Barriers/Preferences: Learning Barriers/Preferences - 05/27/18 1552      Learning Barriers/Preferences   Learning Barriers  Sight    Learning Preferences  Written Material;Verbal Instruction;Video;Group Instruction       Education Topics: Count Your Pulse:  -Group instruction provided by verbal instruction, demonstration, patient participation and written materials to support subject.  Instructors address importance of being able to find your pulse and how to count your pulse when at home without a heart monitor.  Patients get hands on experience counting their pulse with staff help and individually.   Heart Attack, Angina, and Risk Factor Modification:  -Group instruction provided by verbal instruction, video, and written materials to support subject.  Instructors address signs and symptoms of angina and heart attacks.    Also discuss risk factors for heart disease and how to make changes to improve heart health risk factors.   Functional Fitness:  -Group instruction provided by verbal instruction, demonstration, patient participation, and written materials to support subject.  Instructors address safety measures for doing things around the house.  Discuss how to get up and down off the floor, how to pick things up properly, how to safely get out of a chair without assistance, and balance training.   Meditation and Mindfulness:  -Group instruction provided by verbal instruction, patient participation, and written materials to support subject.  Instructor addresses importance of mindfulness and meditation practice to help reduce stress and improve awareness.  Instructor also leads participants through a meditation exercise.    Stretching  for Flexibility and Mobility:  -Group instruction provided by verbal instruction, patient participation, and written materials to support subject.  Instructors lead participants through series of stretches that are designed to increase flexibility thus improving mobility.  These stretches are additional exercise for major muscle groups that are typically performed during regular warm up and cool down.   Hands Only CPR:  -Group verbal, video, and participation provides a basic overview of AHA guidelines for community CPR. Role-play of emergencies allow participants the opportunity to practice calling for help and chest compression technique with discussion of AED use.   Hypertension: -Group verbal and written instruction that provides a basic overview of hypertension including the most recent diagnostic guidelines, risk factor reduction with self-care instructions and medication management.    Nutrition I class: Heart Healthy Eating:  -Group instruction provided by PowerPoint slides, verbal discussion, and written materials to support subject matter. The instructor gives an explanation and review of the Therapeutic Lifestyle Changes diet recommendations, which includes a discussion on lipid goals, dietary fat, sodium,  fiber, plant stanol/sterol esters, sugar, and the components of a well-balanced, healthy diet.   Nutrition II class: Lifestyle Skills:  -Group instruction provided by PowerPoint slides, verbal discussion, and written materials to support subject matter. The instructor gives an explanation and review of label reading, grocery shopping for heart health, heart healthy recipe modifications, and ways to make healthier choices when eating out.   Diabetes Question & Answer:  -Group instruction provided by PowerPoint slides, verbal discussion, and written materials to support subject matter. The instructor gives an explanation and review of diabetes co-morbidities, pre- and post-prandial  blood glucose goals, pre-exercise blood glucose goals, signs, symptoms, and treatment of hypoglycemia and hyperglycemia, and foot care basics.   Diabetes Blitz:  -Group instruction provided by PowerPoint slides, verbal discussion, and written materials to support subject matter. The instructor gives an explanation and review of the physiology behind type 1 and type 2 diabetes, diabetes medications and rational behind using different medications, pre- and post-prandial blood glucose recommendations and Hemoglobin A1c goals, diabetes diet, and exercise including blood glucose guidelines for exercising safely.    Portion Distortion:  -Group instruction provided by PowerPoint slides, verbal discussion, written materials, and food models to support subject matter. The instructor gives an explanation of serving size versus portion size, changes in portions sizes over the last 20 years, and what consists of a serving from each food group.   Stress Management:  -Group instruction provided by verbal instruction, video, and written materials to support subject matter.  Instructors review role of stress in heart disease and how to cope with stress positively.     Exercising on Your Own:  -Group instruction provided by verbal instruction, power point, and written materials to support subject.  Instructors discuss benefits of exercise, components of exercise, frequency and intensity of exercise, and end points for exercise.  Also discuss use of nitroglycerin and activating EMS.  Review options of places to exercise outside of rehab.  Review guidelines for sex with heart disease.   Cardiac Drugs I:  -Group instruction provided by verbal instruction and written materials to support subject.  Instructor reviews cardiac drug classes: antiplatelets, anticoagulants, beta blockers, and statins.  Instructor discusses reasons, side effects, and lifestyle considerations for each drug class.   Cardiac Drugs II:   -Group instruction provided by verbal instruction and written materials to support subject.  Instructor reviews cardiac drug classes: angiotensin converting enzyme inhibitors (ACE-I), angiotensin II receptor blockers (ARBs), nitrates, and calcium channel blockers.  Instructor discusses reasons, side effects, and lifestyle considerations for each drug class.   Anatomy and Physiology of the Circulatory System:  Group verbal and written instruction and models provide basic cardiac anatomy and physiology, with the coronary electrical and arterial systems. Review of: AMI, Angina, Valve disease, Heart Failure, Peripheral Artery Disease, Cardiac Arrhythmia, Pacemakers, and the ICD.   Other Education:  -Group or individual verbal, written, or video instructions that support the educational goals of the cardiac rehab program.   Holiday Eating Survival Tips:  -Group instruction provided by PowerPoint slides, verbal discussion, and written materials to support subject matter. The instructor gives patients tips, tricks, and techniques to help them not only survive but enjoy the holidays despite the onslaught of food that accompanies the holidays.   Knowledge Questionnaire Score: Knowledge Questionnaire Score - 05/28/18 1023      Knowledge Questionnaire Score   Pre Score  24/24       Core Components/Risk Factors/Patient Goals at Admission: Personal Goals and Risk Factors at  Admission - 05/28/18 1121      Core Components/Risk Factors/Patient Goals on Admission    Weight Management  Yes;Obesity    Intervention  Weight Management/Obesity: Establish reasonable short term and long term weight goals.;Obesity: Provide education and appropriate resources to help participant work on and attain dietary goals.    Admit Weight  301 lb 9.4 oz (136.8 kg)    Expected Outcomes  Short Term: Continue to assess and modify interventions until short term weight is achieved;Long Term: Adherence to nutrition and  physical activity/exercise program aimed toward attainment of established weight goal;Weight Loss: Understanding of general recommendations for a balanced deficit meal plan, which promotes 1-2 lb weight loss per week and includes a negative energy balance of [phone] kcal/d;Understanding recommendations for meals to include 15-35% energy as protein, 25-35% energy from fat, 35-60% energy from carbohydrates, less than 200mg  of dietary cholesterol, 20-35 gm of total fiber daily;Understanding of distribution of calorie intake throughout the day with the consumption of 4-5 meals/snacks    Hypertension  Yes    Intervention  Provide education on lifestyle modifcations including regular physical activity/exercise, weight management, moderate sodium restriction and increased consumption of fresh fruit, vegetables, and low fat dairy, alcohol moderation, and smoking cessation.;Monitor prescription use compliance.    Expected Outcomes  Short Term: Continued assessment and intervention until BP is < 140/90mm HG in hypertensive participants. < 130/91mm HG in hypertensive participants with diabetes, heart failure or chronic kidney disease.;Long Term: Maintenance of blood pressure at goal levels.    Stress  Yes    Intervention  Offer individual and/or small group education and counseling on adjustment to heart disease, stress management and health-related lifestyle change. Teach and support self-help strategies.;Refer participants experiencing significant psychosocial distress to appropriate mental health specialists for further evaluation and treatment. When possible, include family members and significant others in education/counseling sessions.    Expected Outcomes  Short Term: Participant demonstrates changes in health-related behavior, relaxation and other stress management skills, ability to obtain effective social support, and compliance with psychotropic medications if prescribed.;Long Term: Emotional wellbeing is  indicated by absence of clinically significant psychosocial distress or social isolation.       Core Components/Risk Factors/Patient Goals Review:  Goals and Risk Factor Review    Row Name 06/20/18 1508 07/11/18 1514 08/01/18 1524         Core Components/Risk Factors/Patient Goals Review   Personal Goals Review  Weight Management/Obesity;Hypertension;Stress  Weight Management/Obesity;Hypertension;Stress  Weight Management/Obesity;Hypertension;Stress     Review  Sarah English vital signs have been stable at phase 2 cardiac rehab. Will continue to monitor stressors and offer support as needed  Sarah English's vital signs have been stable at phase 2 cardiac rehab. Will continue to monitor stressors and offer support as needed  Sarah English's vital signs have been stable at phase 2 cardiac rehab. Will continue to monitor stressors and offer support as needed     Expected Outcomes  Sarah English will continue to participate in phase 2 cardiac rehab for exercise, follow nutrtion and lifestyle modfication opportunities.  Sarah English will continue to participate in phase 2 cardiac rehab for exercise, follow nutrtion and lifestyle modfication opportunities.  Sarah English will continue to participate in phase 2 cardiac rehab for exercise, follow nutrtion and lifestyle modfication opportunities.        Core Components/Risk Factors/Patient Goals at Discharge (Final Review):  Goals and Risk Factor Review - 08/01/18 1524      Core Components/Risk Factors/Patient Goals Review   Personal Goals Review  Weight Management/Obesity;Hypertension;Stress  Review  Sarah English's vital signs have been stable at phase 2 cardiac rehab. Will continue to monitor stressors and offer support as needed    Expected Outcomes  Sarah English will continue to participate in phase 2 cardiac rehab for exercise, follow nutrtion and lifestyle modfication opportunities.       ITP Comments: ITP Comments    Row Name 05/27/18 1549 06/20/18 1504 07/11/18 1512 08/01/18  1522     ITP Comments  Dr. Armanda Magic, Medical Director   30 Day ITP Review. Patient with good atendance and participation in phase 2 cardiac rehab.  30 Day ITP Review. Patient with good atendance and participation in phase 2 cardiac rehab. Sarah English has had some abscences due to non cardiac issues  30 Day ITP Review. Patient with good atendance and participation in phase 2 cardiac rehab.        Comments: See ITP comments. Sarah English will complete cardiac rehab at the end of the month.Gladstone Lighter, RN,BSN 08/01/2018 3:26 PM

## 2018-08-02 ENCOUNTER — Encounter (HOSPITAL_COMMUNITY): Payer: Medicaid Other

## 2018-08-05 ENCOUNTER — Encounter (HOSPITAL_COMMUNITY): Payer: Medicaid Other

## 2018-08-05 ENCOUNTER — Encounter (HOSPITAL_COMMUNITY)
Admission: RE | Admit: 2018-08-05 | Discharge: 2018-08-05 | Disposition: A | Discharge status: Home / Self Care | Payer: Medicaid Other | Source: Ambulatory Visit | Attending: Cardiology | Admitting: Cardiology

## 2018-08-05 VITALS — BP 100/70 | HR 90 | Wt 304.0 lb

## 2018-08-05 DIAGNOSIS — I420 Dilated cardiomyopathy: Secondary | ICD-10-CM | POA: Diagnosis not present

## 2018-08-05 DIAGNOSIS — I5022 Chronic systolic (congestive) heart failure: Secondary | ICD-10-CM

## 2018-08-07 ENCOUNTER — Encounter (HOSPITAL_COMMUNITY): Payer: Medicaid Other

## 2018-08-07 ENCOUNTER — Telehealth (HOSPITAL_COMMUNITY): Payer: Self-pay | Admitting: Family Medicine

## 2018-08-09 ENCOUNTER — Encounter (HOSPITAL_COMMUNITY): Payer: Medicaid Other

## 2018-08-12 ENCOUNTER — Encounter (HOSPITAL_COMMUNITY): Payer: Medicaid Other

## 2018-08-16 ENCOUNTER — Encounter (HOSPITAL_COMMUNITY): Payer: Medicaid Other

## 2018-08-19 ENCOUNTER — Encounter (HOSPITAL_COMMUNITY): Payer: Medicaid Other

## 2018-08-19 ENCOUNTER — Encounter (HOSPITAL_COMMUNITY): Payer: Self-pay | Admitting: *Deleted

## 2018-08-19 ENCOUNTER — Telehealth (HOSPITAL_COMMUNITY): Payer: Self-pay | Admitting: *Deleted

## 2018-08-19 DIAGNOSIS — I5022 Chronic systolic (congestive) heart failure: Secondary | ICD-10-CM

## 2018-08-19 NOTE — Progress Notes (Signed)
Discharge Progress Report  Patient Details  Name: Vaniah Mckendree MRN: [id number] Date of Birth: [birth year] Referring Provider:     [facility] from 05/28/2018 in [facility]  Referring Provider  Vernell Leep, MD       Number of Visits: 19  Reason for Discharge:  Patient reached a stable level of exercise.  Smoking History:  Social History   Tobacco Use  Smoking Status Former Smoker  . Packs/day: 0.50  . Years: 8.00  . Pack years: 4.00  . Types: Cigarettes  . Last attempt to quit: 12/10/2012  . Years since quitting: 5.7  Smokeless Tobacco Never Used    Diagnosis:  2130 Chronic systolic congestive heart failure ([place])   ADL UCSD:   Initial Exercise Prescription:   Discharge Exercise Prescription (Final Exercise Prescription Changes): Exercise Prescription Changes - 08/05/18 1500      Response to Exercise   Blood Pressure (Admit)  100/70    Blood Pressure (Exercise)  134/78    Blood Pressure (Exit)  104/72    Heart Rate (Admit)  90 bpm    Heart Rate (Exercise)  144 bpm    Heart Rate (Exit)  90 bpm    Rating of Perceived Exertion (Exercise)  13    Symptoms  --    Duration  Progress to 30 minutes of  aerobic without signs/symptoms of physical distress    Intensity  THRR unchanged      Progression   Progression  Continue to progress workloads to maintain intensity without signs/symptoms of physical distress.    Average METs  4      Resistance Training   Training Prescription  Yes    Weight  5lbs    Reps  10-15    Time  10 Minutes      Interval Training   Interval Training  No      NuStep   Level  7    SPM  85    Minutes  10    METs  3.8      Rower   Level  5    Watts  43    Minutes  10    METs  4.6      Track   Laps  15    Minutes  10    METs  3.6      Home Exercise Plan   Plans to continue exercise at  Home (comment)   walking and doing yoga at home.   Frequency  Add 4 additional  days to program exercise sessions.    Initial Home Exercises Provided  06/14/18       Functional Capacity:   Psychological, QOL, Others - Outcomes: PHQ 2/9: Depression screen [facility] 2/9 08/19/2018 06/03/2018  Decreased Interest 0 0  Down, Depressed, Hopeless 0 0  PHQ - 2 Score 0 0    Quality of Life:   Personal Goals: Goals established at orientation with interventions provided to work toward goal.    Personal Goals Discharge: Goals and Risk Factor Review    Row Name 07/11/18 1514 08/01/18 1524           Core Components/Risk Factors/Patient Goals Review   Personal Goals Review  Weight Management/Obesity;Hypertension;Stress  Weight Management/Obesity;Hypertension;Stress      Review  Derin's vital signs have been stable at phase 2 cardiac rehab. Will continue to monitor stressors and offer support as needed  Freddye's vital signs have been stable at phase 2 cardiac rehab.  Will continue to monitor stressors and offer support as needed      Expected Outcomes  Yaritzi will continue to participate in phase 2 cardiac rehab for exercise, follow nutrtion and lifestyle modfication opportunities.  Sibyl will continue to participate in phase 2 cardiac rehab for exercise, follow nutrtion and lifestyle modfication opportunities.         Exercise Goals and Review:   Exercise Goals Re-Evaluation: Exercise Goals Re-Evaluation    Row Name 07/08/18 1533 08/05/18 1515 08/19/18 1654 08/20/18 1213       Exercise Goal Re-Evaluation   Exercise Goals Review  Increase Physical Activity;Able to understand and use rate of perceived exertion (RPE) scale;Understanding of Exercise Prescription;Knowledge and understanding of Target Heart Rate Range (THRR)  Increase Physical Activity;Able to understand and use rate of perceived exertion (RPE) scale;Understanding of Exercise Prescription;Knowledge and understanding of Target Heart Rate Range (THRR);Increase Strength and Stamina  Increase Physical  Activity;Able to understand and use rate of perceived exertion (RPE) scale;Understanding of Exercise Prescription;Knowledge and understanding of Target Heart Rate Range (THRR);Increase Strength and Stamina  Increase Physical Activity;Able to understand and use rate of perceived exertion (RPE) scale;Understanding of Exercise Prescription;Knowledge and understanding of Target Heart Rate Range (THRR);Increase Strength and Stamina    Comments  Patient is making good progress with exercise at cardiac rehab. Pt switched from recumbent bike to rower and tolerated well. Pt continues to walk and do yoga as her mode of home exercise.  Patient continue to progress well with exercise. Pt is walking 2 miles daily, weather permitting, and yoga 4-6 days/week.  Patient has been out x 2 weeks and unable to complete the phase 2 cardiac rehab program at this time. Pt walks daily and practices yoga 4-6 days/week.  Patient stopped by today to pick up graduation packet. Reviewed exercise prescription with patient, and pt will continue walking and yoga as her mode of exercise. Pt is also considering joining a gym. Pt states that she feels stronger since participating in the cardiac rehab program.     Expected Outcomes  Patient will continue daily exercise routine walking and doing yoga at home in addition to exercise at cardiac rehab.  Continue current exercise routine, increasing workloads as tolerated.  Patient will walk daily and practice yoga 4-6 days/week to maintain health and fitness gains.  Patient will walk daily and practice yoga 4-6 days/week to maintain health and fitness gains.       Nutrition & Weight - Outcomes:  Post Biometrics - 08/05/18 1500       Post  Biometrics   Weight  (!) 304 lb 0.2 oz (137.9 kg)       Nutrition: Nutrition Therapy & Goals - 09/12/18 1654      Nutrition Therapy   Diet  heart healthy      Personal Nutrition Goals   Nutrition Goal  to be determined       Nutrition  Discharge: Nutrition Assessments - 09/12/18 1654      MEDFICTS Scores   Pre Score  40    Post Score  --   pt did not return      Education Questionnaire Score:  Pt graduated from cardiac rehab program on 08/19/18 with completion of 36 exercise sessions in Phase II. Pt maintained good attendance and progressed nicely during her participation in rehab as evidenced by increased MET level. PHQ 0  Pt has made significant lifestyle changes and should be commended for her success. Carnisha plans to continue exercise by participating  in Yoga and walking 2 miles a day. Medications reconciled.Barnet Pall, RN,BSN 09/13/2018 11:56 AM

## 2018-08-23 ENCOUNTER — Encounter (HOSPITAL_COMMUNITY): Payer: Medicaid Other

## 2018-08-26 ENCOUNTER — Encounter (HOSPITAL_COMMUNITY): Payer: Medicaid Other

## 2018-08-28 ENCOUNTER — Encounter (HOSPITAL_COMMUNITY): Payer: Medicaid Other

## 2018-08-30 ENCOUNTER — Encounter (HOSPITAL_COMMUNITY): Payer: Medicaid Other

## 2018-09-02 ENCOUNTER — Encounter (HOSPITAL_COMMUNITY): Payer: Medicaid Other

## 2018-09-04 ENCOUNTER — Encounter (HOSPITAL_COMMUNITY): Payer: Medicaid Other

## 2018-09-06 ENCOUNTER — Encounter (HOSPITAL_COMMUNITY): Payer: Medicaid Other

## 2018-09-10 ENCOUNTER — Other Ambulatory Visit: Payer: Self-pay | Admitting: Cardiology

## 2018-09-10 DIAGNOSIS — I429 Cardiomyopathy, unspecified: Secondary | ICD-10-CM

## 2018-09-12 NOTE — Addendum Note (Signed)
Encounter addended by: Enid Skeens, RD on: 09/12/2018 4:55 PM  Actions taken: Visit Navigator Flowsheet section accepted

## 2018-09-17 ENCOUNTER — Encounter: Payer: Self-pay | Admitting: Plastic Surgery

## 2018-09-17 ENCOUNTER — Ambulatory Visit (INDEPENDENT_AMBULATORY_CARE_PROVIDER_SITE_OTHER): Payer: Medicaid Other | Admitting: Plastic Surgery

## 2018-09-17 VITALS — BP 133/85 | HR 77 | Temp 98.7°F | Ht 65.0 in | Wt 302.0 lb

## 2018-09-17 DIAGNOSIS — G8929 Other chronic pain: Secondary | ICD-10-CM

## 2018-09-17 DIAGNOSIS — N62 Hypertrophy of breast: Secondary | ICD-10-CM | POA: Insufficient documentation

## 2018-09-17 DIAGNOSIS — M3501 Sicca syndrome with keratoconjunctivitis: Secondary | ICD-10-CM

## 2018-09-17 DIAGNOSIS — E063 Autoimmune thyroiditis: Secondary | ICD-10-CM | POA: Diagnosis not present

## 2018-09-17 DIAGNOSIS — M542 Cervicalgia: Secondary | ICD-10-CM

## 2018-09-17 DIAGNOSIS — M546 Pain in thoracic spine: Secondary | ICD-10-CM

## 2018-09-17 DIAGNOSIS — M549 Dorsalgia, unspecified: Secondary | ICD-10-CM | POA: Insufficient documentation

## 2018-09-17 NOTE — Progress Notes (Signed)
Patient ID: Sarah English, female    DOB: [birth year], 43 y.o.   MRN: [id number]   Chief Complaint  Patient presents with  . Breast Problem    Mammary Hyperplasia: The patient is a 43 y.o. female with a history of mammary hyperplasia for several years.  She has extremely large breasts causing symptoms that include the following: Back pain (upper and lower) and neck pain. She frequently pins bra cups higher on straps for better lift and relief. Notices relief when holding breast up in her hands. Shoulder straps causing grooves, pain occasionally requiring padding. Pain medication is sometimes required with motrin and tylenol.  Activities that are hindered by enlarged breasts include: running and exercise.  She has multiple medical problems including Sjogren's, scleroderma and thyroid disease.  Past surgery includes cholecystectomy  Her breasts are extremely large and fairly symmetric.  She has hyperpigmentation of the inframammary area on both sides.  The sternal to nipple distance on the right is 36 cm and the left is 40 cm.  The IMF distance is 20 cm.  She is 5 feet 5 inches tall and weighs 302 pounds.  Preoperative bra size = 44 GG cup. She would like to be a C/D cup.  The estimated excess breast tissue to be removed at the time of surgery = 1000 grams on the left and 1000 grams on the right.  Mammogram history: within the last 6 months and was negative.  She will sign a release for [place] to get the results.     Review of Systems  Constitutional: Positive for activity change. Negative for appetite change and diaphoresis.  HENT: Negative.   Eyes: Negative.   Respiratory: Negative for chest tightness and shortness of breath.   Cardiovascular: Negative.   Gastrointestinal: Negative.  Negative for abdominal distention and abdominal pain.  Genitourinary: Negative.   Musculoskeletal: Positive for back pain and neck pain. Negative for joint swelling.  Psychiatric/Behavioral: Negative.     Past  Medical History:  Diagnosis Date  . Anemia   . Autoimmune disorder (HCC)   . Bronchitis    hx -uses inhaler prn  . Chronic kidney disease    kidney infections  . Endometriosis   . Fibromyalgia   . GERD (gastroesophageal reflux disease)    occasional  . Hashimoto thyroiditis, fibrous variant   . Hashimoto's disease   . Headache    migraines  . History of kidney stones   . Hypertension   . IVCD (intraventricular conduction defect)   . LBBB (left bundle branch block)   . Nonischemic cardiomyopathy (HCC)    per stress test 12-17-2017  ef 14%;  per echo 12-13-2017 ef 21%  . Peripheral vascular disease (HCC)    raynoids syndrome  . Pneumonia    only x 1 - 2- 3 months ago  . 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 Scleroderma (HCC)   . Sjogren's syndrome (HCC)   . Sleep apnea    recently dx 04/13/18, does not have CPAP machine yet.  . Systemic sclerosis (HCC)   . Systolic CHF with reduced left ventricular function, NYHA class 3 [facility])    cardiologist-  dr Evlyn Clines patwardhan  . Tendinitis    lower back  . Thyroiditis     Past Surgical History:  Procedure Laterality Date  . CESAREAN SECTION  2004   x 1  . CHOLECYSTECTOMY    . COLONOSCOPY    .  CYSTOSCOPY     x 2 - bladder as a child  . HYSTEROSCOPY W/D&C N/A 04/16/2018   Procedure: DILATATION AND CURETTAGE /HYSTEROSCOPY;  Surgeon: Lavina HammanMeisinger, Todd, MD;  Location: [facility] ORS;  Service: Gynecology;  Laterality: N/A;  . LAPAROSCOPIC LYSIS OF ADHESIONS  04/16/2018   Procedure: LAPAROSCOPIC LYSIS OF ADHESIONS;  Surgeon: Lavina HammanMeisinger, Todd, MD;  Location: [facility] ORS;  Service: Gynecology;;  . MANDIBLE FRACTURE SURGERY  1994   upper mandibular  . RIGHT/LEFT HEART CATH AND CORONARY ANGIOGRAPHY N/A 12/18/2017   Procedure: RIGHT/LEFT HEART CATH AND CORONARY ANGIOGRAPHY;  Surgeon: Elder NegusPatwardhan, Manish J, MD;  Location: [facility] IN[facility];  Service: Cardiovascular;  Laterality: N/A;  . UPPER GI ENDOSCOPY     . WISDOM TOOTH EXTRACTION        Current Outpatient Medications:  .  sacubitril-valsartan (ENTRESTO) 49-51 MG, Take 1 tablet by mouth 2 (two) times daily., Disp: , Rfl:  .  albuterol (PROVENTIL HFA;VENTOLIN HFA) 108 (90 Base) MCG/ACT inhaler, Inhale into the lungs every 6 (six) hours as needed for wheezing or shortness of breath., Disp: , Rfl:  .  Aloe-Sodium Chloride (AYR SALINE NASAL GEL NA), Place 1 spray into the nose daily., Disp: , Rfl:  .  antiseptic oral rinse (BIOTENE) LIQD, 1-2 application by Mouth Rinse route every hour as needed for dry mouth., Disp: , Rfl:  .  CAMILA 0.35 MG tablet, Take 1 tablet by mouth at bedtime. , Disp: , Rfl: 1 .  CORLANOR 5 MG TABS tablet, Take 5 mg by mouth 2 (two) times daily., Disp: , Rfl: 2 .  cyclobenzaprine (FLEXERIL) 10 MG tablet, Take 10 mg by mouth 3 (three) times daily as needed for muscle spasms., Disp: , Rfl:  .  cycloSPORINE (RESTASIS) 0.05 % ophthalmic emulsion, Place 1 drop into both eyes 2 (two) times daily. , Disp: , Rfl:  .  etonogestrel-ethinyl estradiol (NUVARING) 0.12-0.015 MG/24HR vaginal ring, Place 1 each vaginally every 28 (twenty-eight) days. Insert vaginally and leave in place for 3 consecutive weeks, then remove for 1 week., Disp: , Rfl:  .  furosemide (LASIX) 20 MG tablet, Take 20 mg by mouth daily. , Disp: , Rfl:  .  gabapentin (NEURONTIN) 300 MG capsule, Take 1 capsule (300 mg total) by mouth 3 (three) times daily., Disp: 90 capsule, Rfl: 5 .  griseofulvin (GRIFULVIN V) 500 MG tablet, Take 1 tablet by mouth daily. For 4 to 6 weeks, Disp: , Rfl: 1 .  HYDROcodone-acetaminophen (NORCO) 5-325 MG tablet, Take 1 tablet by mouth every 6 (six) hours as needed for severe pain. (Patient not taking: Reported on 06/03/2018), Disp: 30 tablet, Rfl: 0 .  Lactobacillus Rhamnosus, GG, (CULTURELLE PROBIOTICS KIDS PO), Take 1 tablet by mouth daily., Disp: , Rfl:  .  metoprolol succinate (TOPROL-XL) 25 MG 24 hr tablet, Take 25 mg by mouth daily.,  Disp: , Rfl: 2 .  omeprazole (PRILOSEC) 20 MG capsule, Take 20 mg by mouth daily., Disp: , Rfl:  .  selenium (SELENIMIN-200) 200 MCG TABS tablet, Take 200 mcg by mouth daily. , Disp: , Rfl:  .  silver sulfADIAZINE (SILVADENE) 1 % cream, Apply 1 application topically daily as needed (PAIN)., Disp: , Rfl:  .  spironolactone (ALDACTONE) 25 MG tablet, Take 12.5 mg by mouth daily., Disp: , Rfl:  .  SUMAtriptan (IMITREX) 100 MG tablet, Take 100 mg by mouth every 6 (six) hours as needed for migraine. May repeat in 2 hours if headache persists or recurs., Disp: , Rfl:  .  traMADol (ULTRAM) 50 MG tablet, Take 50 mg by mouth 2 (two) times daily as needed for moderate pain. , Disp: , Rfl:    Objective:   Vitals:   09/17/18 0932  BP: 133/85  Pulse: 77  Temp: 98.7 F (37.1 C)  SpO2: 99%    Physical Exam Vitals signs and nursing note reviewed.  Constitutional:      Appearance: Normal appearance.  HENT:     Head: Normocephalic and atraumatic.     Nose: Nose normal.     Mouth/Throat:     Mouth: Mucous membranes are moist.  Eyes:     Extraocular Movements: Extraocular movements intact.     Pupils: Pupils are equal, round, and reactive to light.  Cardiovascular:     Rate and Rhythm: Normal rate.  Pulmonary:     Effort: Pulmonary effort is normal. No respiratory distress.  Abdominal:     General: Abdomen is flat. There is no distension.     Tenderness: There is no abdominal tenderness. There is no right CVA tenderness.  Neurological:     General: No focal deficit present.     Mental Status: She is alert.  Psychiatric:        Mood and Affect: Mood normal.        Thought Content: Thought content normal.        Judgment: Judgment normal.     Assessment & Plan:  Sjogren's syndrome with keratoconjunctivitis sicca (HCC)  Hashimoto's thyroiditis  Symptomatic mammary hypertrophy  Chronic bilateral thoracic back pain  Neck pain Recommend bilateral breast reduction.  Risks and  complications were discussed.  Pictures were taken. Alena Bills , DO

## 2018-09-24 ENCOUNTER — Ambulatory Visit: Payer: Medicaid Other

## 2018-09-24 DIAGNOSIS — I428 Other cardiomyopathies: Secondary | ICD-10-CM

## 2018-09-24 DIAGNOSIS — I429 Cardiomyopathy, unspecified: Secondary | ICD-10-CM

## 2018-09-27 ENCOUNTER — Other Ambulatory Visit: Payer: Self-pay | Admitting: Cardiology

## 2018-09-30 ENCOUNTER — Other Ambulatory Visit: Payer: Self-pay

## 2018-09-30 MED ORDER — CORLANOR 5 MG PO TABS: 5.0000 mg | ORAL_TABLET | Freq: Two times a day (BID) | ORAL | 2 refills | Status: DC

## 2018-10-03 ENCOUNTER — Ambulatory Visit: Payer: Self-pay | Admitting: Cardiology

## 2018-10-03 ENCOUNTER — Ambulatory Visit: Payer: Medicaid Other | Admitting: Cardiology

## 2018-10-04 ENCOUNTER — Ambulatory Visit: Payer: Medicaid Other | Admitting: Cardiology

## 2018-10-04 ENCOUNTER — Encounter: Payer: Self-pay | Admitting: Cardiology

## 2018-10-04 VITALS — BP 132/72 | HR 76 | Ht 65.0 in | Wt 308.7 lb

## 2018-10-04 DIAGNOSIS — I10 Essential (primary) hypertension: Secondary | ICD-10-CM

## 2018-10-04 DIAGNOSIS — I5022 Chronic systolic (congestive) heart failure: Secondary | ICD-10-CM

## 2018-10-04 DIAGNOSIS — R002 Palpitations: Secondary | ICD-10-CM

## 2018-10-04 MED ORDER — METOPROLOL SUCCINATE ER 50 MG PO TB24: 50.0000 mg | ORAL_TABLET | Freq: Every day | ORAL | 3 refills | Status: DC

## 2018-10-04 NOTE — Progress Notes (Signed)
Patient is here for follow up visit.  Subjective:   @Patient  ID: Sarah English, female    DOB: 02/03/1976, 43 y.o.   MRN: [id number]  Chief Complaint  Patient presents with  . Cardiomyopathy    HPI   43 y/o Caucasian female with Hashimoto thyroiditis, systemic sclerosis, Sjogren's disease, Raynaud's syndrome, morbid obesity, endometriosis, menorrhagia, now with nonischemic dilated cardiomyopathy.  Patient has been on uptitrating guideline directed medical therapy with no significant improvement in her EF. However, she has had improvement in her clinical symptoms.  She is currently on Entresto 97-103 mg bid, spironolactone 25 mg daily, metoprolol succinate 25 mg daily, corlanor 5 mg bid.   Last two weeks, she has felt more short of breath. She is trying to lose weight, but has not made much improvement. She is trying to walk 2 miles a day and eat low salt diet.    Past Medical History:  Diagnosis Date  . Anemia   . Autoimmune disorder (HCC)   . Bronchitis    hx -uses inhaler prn  . Chronic kidney disease    kidney infections  . Endometriosis   . Fibromyalgia   . GERD (gastroesophageal reflux disease)    occasional  . Hashimoto thyroiditis, fibrous variant   . Hashimoto's disease   . Headache    migraines  . History of kidney stones   . Hypertension   . IVCD (intraventricular conduction defect)   . LBBB (left bundle branch block)   . Nonischemic cardiomyopathy (HCC)    per stress test 12-17-2017  ef 14%;  per echo 12-13-2017 ef 21%  . Peripheral vascular disease (HCC)    raynoids syndrome  . Pneumonia    only x 1 - 2- 3 months ago  . 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 Scleroderma (HCC)   . Sjogren's syndrome (HCC)   . Sleep apnea    recently dx 04/13/18, does not have CPAP machine yet.  . Systemic sclerosis (HCC)   . Systolic CHF with reduced left ventricular function, NYHA class 3 [facility](HCC)    cardiologist-   dr Evlyn Clinesmanish Aaden Buckman  . Tendinitis    lower back  . Thyroiditis     Past Surgical History:  Procedure Laterality Date  . CESAREAN SECTION  2004   x 1  . CHOLECYSTECTOMY    . COLONOSCOPY    . CYSTOSCOPY     x 2 - bladder as a child  . HYSTEROSCOPY W/D&C N/A 04/16/2018   Procedure: DILATATION AND CURETTAGE /HYSTEROSCOPY;  Surgeon: Lavina HammanMeisinger, Todd, MD;  Location: [facility];  Service: Gynecology;  Laterality: N/A;  . LAPAROSCOPIC LYSIS OF ADHESIONS  04/16/2018   Procedure: LAPAROSCOPIC LYSIS OF ADHESIONS;  Surgeon: Lavina HammanMeisinger, Todd, MD;  Location: [facility];  Service: Gynecology;;  . MANDIBLE FRACTURE SURGERY  1994   upper mandibular  . RIGHT/LEFT HEART CATH AND CORONARY ANGIOGRAPHY N/A 12/18/2017   Procedure: RIGHT/LEFT HEART CATH AND CORONARY ANGIOGRAPHY;  Surgeon: Elder NegusPatwardhan, Iyonna Rish J, MD;  Location: [facility];  Service: Cardiovascular;  Laterality: N/A;  . UPPER GI ENDOSCOPY    . WISDOM TOOTH EXTRACTION      Social History   Socioeconomic History  . Marital status: Single    Spouse name: Not on file  . Number of children: 1  . Years of education: 6314  . Highest education level: Associate degree: occupational, Scientist, product/process developmenttechnical, or vocational program  Occupational History  . Occupation:  not working  Social Needs  . Financial resource strain: Not on file  . Food insecurity:    Worry: Not on file    Inability: Not on file  . Transportation needs:    Medical: Not on file    Non-medical: Not on file  Tobacco Use  . Smoking status: Former Smoker    Packs/day: 0.50    Years: 8.00    Pack years: 4.00    Types: Cigarettes    Last attempt to quit: 12/10/2012    Years since quitting: 5.8  . Smokeless tobacco: Never Used  Substance and Sexual Activity  . Alcohol use: Yes    Comment: occasional wine  . Drug use: No  . Sexual activity: Yes    Birth control/protection: Pill  Lifestyle  . Physical activity:    Days per week: Not on file    Minutes per session: Not on file  .  Stress: Not on file  Relationships  . Social connections:    Talks on phone: Not on file    Gets together: Not on file    Attends religious service: Not on file    Active member of club or organization: Not on file    Attends meetings of clubs or organizations: Not on file    Relationship status: Not on file  . Intimate partner violence:    Fear of current or ex partner: Not on file    Emotionally abused: Not on file    Physically abused: Not on file    Forced sexual activity: Not on file  Other Topics Concern  . Not on file  Social History Narrative   Lives with son, renting a room from her mother.  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     Education: college.     Current Outpatient Medications on File Prior to Visit  Medication Sig Dispense Refill  . albuterol (PROVENTIL HFA;VENTOLIN HFA) 108 (90 Base) MCG/ACT inhaler Inhale into the lungs every 6 (six) hours as needed for wheezing or shortness of breath.    . Aloe-Sodium Chloride (AYR SALINE NASAL GEL NA) Place 1 spray into the nose daily.    Marland Kitchen antiseptic oral rinse (BIOTENE) LIQD 1-2 application by Mouth Rinse route every hour as needed for dry mouth.    Retia Passe 5 MG TABS tablet TAKE 1 TABLET BY MOUTH TWICE A DAY 60 tablet 5  . cyclobenzaprine (FLEXERIL) 10 MG tablet Take 10 mg by mouth 3 (three) times daily as needed for muscle spasms.    . cycloSPORINE (RESTASIS) 0.05 % ophthalmic emulsion Place 1 drop into both eyes 2 (two) times daily.     Marland Kitchen etonogestrel-ethinyl estradiol (NUVARING) 0.12-0.015 MG/24HR vaginal ring Place 1 each vaginally every 28 (twenty-eight) days. Insert vaginally and leave in place for 3 consecutive weeks, then remove for 1 week.    . furosemide (LASIX) 20 MG tablet Take 20 mg by mouth daily.     Marland Kitchen gabapentin (NEURONTIN) 300 MG capsule Take 1 capsule (300 mg total) by mouth 3 (three) times daily. 90 capsule 5  . griseofulvin (GRIFULVIN V) 500 MG tablet Take 1  tablet by mouth daily. For 4 to 6 weeks  1  . HYDROcodone-acetaminophen (NORCO) 5-325 MG tablet Take 1 tablet by mouth every 6 (six) hours as needed for severe pain. 30 tablet 0  . Lactobacillus Rhamnosus, GG, (CULTURELLE PROBIOTICS KIDS PO) Take 1 tablet by mouth daily.    Marland Kitchen  omeprazole (PRILOSEC) 20 MG capsule Take 20 mg by mouth daily.    . sacubitril-valsartan (ENTRESTO) 97-103 MG Take 1 tablet by mouth 2 (two) times daily.     Marland Kitchen selenium (SELENIMIN-200) 200 MCG TABS tablet Take 200 mcg by mouth daily.     . silver sulfADIAZINE (SILVADENE) 1 % cream Apply 1 application topically daily as needed (PAIN).    Marland Kitchen spironolactone (ALDACTONE) 25 MG tablet Take 12.5 mg by mouth daily.    . SUMAtriptan (IMITREX) 100 MG tablet Take 100 mg by mouth every 6 (six) hours as needed for migraine. May repeat in 2 hours if headache persists or recurs.    . traMADol (ULTRAM) 50 MG tablet Take 50 mg by mouth 2 (two) times daily as needed for moderate pain.     Marland Kitchen CAMILA 0.35 MG tablet Take 1 tablet by mouth at bedtime.   1   No current facility-administered medications on file prior to visit.     Cardiovascular studies:  EKG 10/04/2018: Sinus  Rhythm  Left bundle branch block.   Echocardiogram 09/24/2018: Left ventricle cavity is normal in size. Moderate concentric hypertrophy of the left ventricle. Abnormal septal wall motion due to left bundle branch block. Doppler evidence of grade I (impaired) diastolic dysfunction, indeterminate LAP. Calculated EF 20%. Left atrial cavity is mildly dilated. Mild (Grade I) mitral regurgitation. Mild tricuspid regurgitation. Estimated pulmonary artery systolic pressure 26  mmHg. No significant change compared to previous study on 05/02/2018.  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  RV: 46/6 mmHg. RVEDP 11  mmHg PA 56/28 mmHg, mean PAP 40 mmHg PW: 27 mmHg LV 137/0 mmHg with LVEDP 36 mmHg  CO 5.2 L/min CI 2.2 L/min/m2  Review of Systems  Constitution: Positive for malaise/fatigue. Negative for decreased appetite, weight gain and weight loss.  HENT: Negative for congestion.   Eyes: Negative for visual disturbance.  Cardiovascular: Positive for dyspnea on exertion. Negative for chest pain, leg swelling, palpitations and syncope.  Respiratory: Negative for shortness of breath.   Endocrine: Negative for cold intolerance.  Hematologic/Lymphatic: Does not bruise/bleed easily.  Skin: Negative for itching and rash.  Musculoskeletal: Negative for myalgias.  Gastrointestinal: Negative for abdominal pain, nausea and vomiting.  Genitourinary: Negative for dysuria.  Neurological: Negative for dizziness and weakness.  Psychiatric/Behavioral: The patient is not nervous/anxious.   All other systems reviewed and are negative.      Objective:   Vitals:   10/04/18 1003  BP: 132/72  Pulse: 76  SpO2: 97%     Physical Exam  Constitutional: She is oriented to person, place, and time. She appears well-developed and well-nourished. No distress.  Morbidly obese  HENT:  Head: Normocephalic and atraumatic.  Eyes: Pupils are equal, round, and reactive to light. Conjunctivae are normal.  Neck: No JVD present.  Cardiovascular: Normal rate, regular rhythm and intact distal pulses.  No murmur heard. Pulmonary/Chest: Effort normal and breath sounds normal. She has no wheezes. She has no rales.  Abdominal: Soft. Bowel sounds are normal. There is no rebound.  Musculoskeletal:        General: No edema.  Lymphadenopathy:    She has no cervical adenopathy.  Neurological: She is alert and oriented to person, place, and time. No cranial nerve deficit.  Skin: Skin is warm and dry.  Psychiatric: She has a normal mood and affect.  Nursing note and vitals reviewed.  Assessment & Recommendations:   43  y/o Caucasian female with Hashimoto thyroiditis, systemic sclerosis, Sjogren's disease, Raynaud's syndrome, morbid obesity, endometriosis, menorrhagia, now with nonischemic dilated cardiomyopathy.  1. Chronic systolic heart failure [facility]):     Nonischemic cardiomyopathy. Continue Entresto 97-103 mg bid, spironolactone 25 mg daily, corlanor 5 mg bid. Increased metoprolol succinate to 50 mg daily.      Given her persistent NYHA class II-III symptoms and LBBB, I will refer her to EP for consideration of cardiac resynchronization therapy.       All said and done, I do think her dyspnea is multifactorial, most notably due to obesity and dyspnea.   2. Hypertension:     Controlled      I will see her back in 6 months.    Elder Negus, MD [facility] Pager: [phone] Office: [phone] If no answer Cell [phone]

## 2018-10-07 ENCOUNTER — Other Ambulatory Visit: Payer: Self-pay

## 2018-10-07 ENCOUNTER — Emergency Department (HOSPITAL_COMMUNITY): Payer: Medicaid Other

## 2018-10-07 ENCOUNTER — Emergency Department (HOSPITAL_COMMUNITY)
Admission: EM | Admit: 2018-10-07 | Discharge: 2018-10-08 | Disposition: A | Discharge status: Home / Self Care | Payer: Medicaid Other | Attending: Emergency Medicine | Admitting: Emergency Medicine

## 2018-10-07 DIAGNOSIS — N189 Chronic kidney disease, unspecified: Secondary | ICD-10-CM | POA: Insufficient documentation

## 2018-10-07 DIAGNOSIS — Z87891 Personal history of nicotine dependence: Secondary | ICD-10-CM | POA: Insufficient documentation

## 2018-10-07 DIAGNOSIS — I130 Hypertensive heart and chronic kidney disease with heart failure and stage 1 through stage 4 chronic kidney disease, or unspecified chronic kidney disease: Secondary | ICD-10-CM | POA: Diagnosis not present

## 2018-10-07 DIAGNOSIS — I5023 Acute on chronic systolic (congestive) heart failure: Secondary | ICD-10-CM | POA: Diagnosis not present

## 2018-10-07 DIAGNOSIS — Z79899 Other long term (current) drug therapy: Secondary | ICD-10-CM | POA: Diagnosis not present

## 2018-10-07 DIAGNOSIS — M25562 Pain in left knee: Secondary | ICD-10-CM | POA: Diagnosis present

## 2018-10-07 NOTE — ED Triage Notes (Signed)
Pt states that she has been having left knee pain for a couple of days and that she went to her PCP today who stated that she probably had a torn ligament and to obtain a knee brace and let it rest. Pt states that she was on the way to get a knee brace when she felt a pop with intense pain in her knee while walking up stairs. Pt states knee pain is 9/10.

## 2018-10-08 MED ORDER — TRAMADOL HCL 50 MG PO TABS: 50.0000 mg | ORAL_TABLET | Freq: Four times a day (QID) | ORAL | 0 refills | Status: DC | PRN

## 2018-10-08 MED ORDER — HYDROCODONE-ACETAMINOPHEN 5-325 MG PO TABS
2.0000 | ORAL_TABLET | Freq: Once | ORAL | Status: AC
  Administered 2018-10-08: 2 via ORAL
  Filled 2018-10-08: qty 2

## 2018-10-08 NOTE — Discharge Instructions (Addendum)
Use ice, Tylenol, and ibuprofen as needed and discussed. See bone doctor in 7 to 10 days if no significant improvement.

## 2018-10-08 NOTE — ED Provider Notes (Signed)
[facility] EMERGENCY DEPARTMENT Provider Note   CSN: [id number] Arrival date & time: 10/07/18  2200    History   Chief Complaint Chief Complaint  Patient presents with  . Knee Pain    HPI Sarah English is a 43 y.o. female.     Patient with history of chronic kidney disease, autoimmune disorder, left bundle branch block, pulmonary hypertension Presents with sudden onset left knee pain severe with a loud pop prior to arrival.  Patient had mild pain to that knee and was follow-up with primary doctor for supportive care however this was a sudden worsening.  No direct trauma.  Significant pain with any range of motion.  No history of knee problems.     Past Medical History:  Diagnosis Date  . Anemia   . Autoimmune disorder (HCC)   . Bronchitis    hx -uses inhaler prn  . Chronic kidney disease    kidney infections  . Endometriosis   . Fibromyalgia   . GERD (gastroesophageal reflux disease)    occasional  . Hashimoto thyroiditis, fibrous variant   . Hashimoto's disease   . Headache    migraines  . History of kidney stones   . Hypertension   . IVCD (intraventricular conduction defect)   . LBBB (left bundle branch block)   . Nonischemic cardiomyopathy (HCC)    per stress test 12-17-2017  ef 14%;  per echo 12-13-2017 ef 21%  . Peripheral vascular disease (HCC)    raynoids syndrome  . Pneumonia    only x 1 - 2- 3 months ago  . 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 Scleroderma (HCC)   . Sjogren's syndrome (HCC)   . Sleep apnea    recently dx 04/13/18, does not have CPAP machine yet.  . Systemic sclerosis (HCC)   . Systolic CHF with reduced left ventricular function, NYHA class 3 [facility])    cardiologist-  dr Evlyn Clines patwardhan  . Tendinitis    lower back  . Thyroiditis     Patient Active Problem List   Diagnosis Date Noted  . Symptomatic mammary hypertrophy 09/17/2018  . Back pain 09/17/2018  .  Neck pain 09/17/2018  . Pelvic pain in female 04/16/2018  . Abnormal uterine bleeding 04/16/2018  . CHF (congestive heart failure), NYHA class III, acute on chronic, systolic (HCC) 04/02/2018  . Scleroderma (HCC) 04/02/2018  . Snoring 04/02/2018  . Sicca complex (HCC) 04/02/2018  . Sjogren's syndrome with keratoconjunctivitis sicca (HCC) 04/02/2018  . Morbid obesity with body mass index (BMI) of 45.0 to 49.9 in adult [facility]) 04/02/2018  . HFrEF (heart failure with reduced ejection fraction) (HCC) 12/17/2017  . Abnormal stress test 12/17/2017  . Vitamin D insufficiency 02/10/2014  . Severe obesity (BMI >= 40) (HCC) 02/10/2014  . Hashimoto's thyroiditis 11/24/2013  . Other malaise and fatigue 11/24/2013    Past Surgical History:  Procedure Laterality Date  . CESAREAN SECTION  2004   x 1  . CHOLECYSTECTOMY    . COLONOSCOPY    . CYSTOSCOPY     x 2 - bladder as a child  . HYSTEROSCOPY W/D&C N/A 04/16/2018   Procedure: DILATATION AND CURETTAGE /HYSTEROSCOPY;  Surgeon: Lavina Hamman, MD;  Location: [facility];  Service: Gynecology;  Laterality: N/A;  . LAPAROSCOPIC LYSIS OF ADHESIONS  04/16/2018   Procedure: LAPAROSCOPIC LYSIS OF ADHESIONS;  Surgeon: Lavina Hamman, MD;  Location: [facility];  Service: Gynecology;;  . MANDIBLE  FRACTURE SURGERY  1994   upper mandibular  . RIGHT/LEFT HEART CATH AND CORONARY ANGIOGRAPHY N/A 12/18/2017   Procedure: RIGHT/LEFT HEART CATH AND CORONARY ANGIOGRAPHY;  Surgeon: Elder Negus, MD;  Location: [facility];  Service: Cardiovascular;  Laterality: N/A;  . UPPER GI ENDOSCOPY    . WISDOM TOOTH EXTRACTION       OB History   No obstetric history on file.      Home Medications    Prior to Admission medications   Medication Sig Start Date End Date Taking? Authorizing Provider  albuterol (PROVENTIL HFA;VENTOLIN HFA) 108 (90 Base) MCG/ACT inhaler Inhale into the lungs every 6 (six) hours as needed for wheezing or shortness of breath.    [provider]  Aloe-Sodium Chloride (AYR SALINE NASAL GEL NA) Place 1 spray into the nose daily.    [provider]  antiseptic oral rinse (BIOTENE) LIQD 1-2 application by Mouth Rinse route every hour as needed for dry mouth.    [provider]  CAMILA 0.35 MG tablet Take 1 tablet by mouth at bedtime.  03/08/18   [provider]  CORLANOR 5 MG TABS tablet TAKE 1 TABLET BY MOUTH TWICE A DAY 10/01/18   Patwardhan, Manish J, MD  cyclobenzaprine (FLEXERIL) 10 MG tablet Take 10 mg by mouth 3 (three) times daily as needed for muscle spasms.    [provider]  cycloSPORINE (RESTASIS) 0.05 % ophthalmic emulsion Place 1 drop into both eyes 2 (two) times daily.     [provider]  etonogestrel-ethinyl estradiol (NUVARING) 0.12-0.015 MG/24HR vaginal ring Place 1 each vaginally every 28 (twenty-eight) days. Insert vaginally and leave in place for 3 consecutive weeks, then remove for 1 week.    [provider]  furosemide (LASIX) 20 MG tablet Take 20 mg by mouth daily.     [provider]  gabapentin (NEURONTIN) 300 MG capsule Take 1 capsule (300 mg total) by mouth 3 (three) times daily. 04/26/18   Patel, Roxana Hires K, DO  griseofulvin (GRIFULVIN V) 500 MG tablet Take 1 tablet by mouth daily. For 4 to 6 weeks 03/25/18   [provider]  HYDROcodone-acetaminophen (NORCO) 5-325 MG tablet Take 1 tablet by mouth every 6 (six) hours as needed for severe pain. 04/16/18   Meisinger, Todd, MD  Lactobacillus Rhamnosus, GG, (CULTURELLE PROBIOTICS KIDS PO) Take 1 tablet by mouth daily.    [provider]  metoprolol succinate (TOPROL-XL) 50 MG 24 hr tablet Take 1 tablet (50 mg total) by mouth daily. 10/04/18   Patwardhan, Anabel Bene, MD  omeprazole (PRILOSEC) 20 MG capsule Take 20 mg by mouth daily.    [provider]  sacubitril-valsartan (ENTRESTO) 97-103 MG Take 1 tablet by mouth 2 (two) times daily.     [provider]  selenium  (SELENIMIN-200) 200 MCG TABS tablet Take 200 mcg by mouth daily.     [provider]  silver sulfADIAZINE (SILVADENE) 1 % cream Apply 1 application topically daily as needed (PAIN).    [provider]  spironolactone (ALDACTONE) 25 MG tablet Take 12.5 mg by mouth daily.    [provider]  SUMAtriptan (IMITREX) 100 MG tablet Take 100 mg by mouth every 6 (six) hours as needed for migraine. May repeat in 2 hours if headache persists or recurs.    [provider]  traMADol (ULTRAM) 50 MG tablet Take 50 mg by mouth 2 (two) times daily as needed for moderate pain.  [provider]  traMADol (ULTRAM) 50 MG tablet Take 1 tablet (50 mg total) by mouth every 6 (six) hours as needed. 10/08/18   Blane Ohara, MD    Family History Family History  Problem Relation Age of Onset  . Diabetes Mother        Type II  . Heart disease Mother        stent surgery  . Hypertension Mother   . Hyperlipidemia Mother   . Diabetes Father        Type II  . Hypertension Father   . Hyperlipidemia Father     Social History Social History   Tobacco Use  . Smoking status: Former Smoker    Packs/day: 0.50    Years: 8.00    Pack years: 4.00    Types: Cigarettes    Last attempt to quit: 12/10/2012    Years since quitting: 5.8  . Smokeless tobacco: Never Used  Substance Use Topics  . Alcohol use: Yes    Comment: occasional wine  . Drug use: No     Allergies   Sulfa antibiotics   Review of Systems Review of Systems  Constitutional: Negative for fever.  Musculoskeletal: Positive for gait problem and joint swelling.  Skin: Negative for rash.  Neurological: Negative for weakness and numbness.     Physical Exam Updated Vital Signs BP (!) 148/89 (BP Location: Left Arm)   Pulse (!) 104   Temp 98.2 F (36.8 C) (Oral)   Ht 5\' 5"  (1.651 m)   Wt (!) 138.3 kg   LMP  (Within Months)   SpO2 99%   BMI 50.75 kg/m   Physical Exam Vitals signs and nursing  note reviewed.  Constitutional:      Appearance: She is well-developed.  HENT:     Head: Normocephalic and atraumatic.  Eyes:     General:        Right eye: No discharge.        Left eye: No discharge.  Neck:     Trachea: No tracheal deviation.  Cardiovascular:     Rate and Rhythm: Normal rate.  Pulmonary:     Effort: Pulmonary effort is normal.  Musculoskeletal:     Comments: Patient has significant pain with any significant movement of the left knee.  Mild joint effusion.  Tenderness most significant superior patella and medial joint line.  No obvious laxity however difficult exam due to pain.  Neurovascularly intact left leg.  Skin:    General: Skin is warm.  Neurological:     Mental Status: She is alert and oriented to person, place, and time.      ED Treatments / Results  Labs (all labs ordered are listed, but only abnormal results are displayed) Labs Reviewed - No data to display  EKG EKG Interpretation  Date/Time:  Monday October 07 2018 22:51:19 EST Ventricular Rate:  105 PR Interval:  136 QRS Duration: 138 QT Interval:  410 QTC Calculation: 541 R Axis:   105 Text Interpretation:  Sinus tachycardia Rightward axis Non-specific intra-ventricular conduction block Cannot rule out Anterior infarct , age undetermined Abnormal ECG Confirmed by Blane Ohara [phone]) on 10/08/2018 4:17:38 AM   Radiology Dg Knee Complete 4 Views Left  Result Date: 10/07/2018 CLINICAL DATA:  43 year old female status post twisting injury today with left knee pain. EXAM: LEFT KNEE - COMPLETE 4+ VIEW COMPARISON:  None. FINDINGS: No joint effusion. Patella appears intact. Medial and lateral compartment degenerative spurring with relatively preserved joint spaces. No  acute osseous abnormality identified. Bone mineralization is within normal limits. IMPRESSION: No joint effusion or acute osseous abnormality identified about the left knee. Electronically Signed   By: Odessa FlemingH  Hall M.D.   On:  10/07/2018 23:40    Procedures Procedures (including critical care time)  Medications Ordered in ED Medications  HYDROcodone-acetaminophen (NORCO/VICODIN) 5-325 MG per tablet 2 tablet (has no administration in time range)     Initial Impression / Assessment and Plan / ED Course  I have reviewed the triage vital signs and the nursing notes.  Pertinent labs & imaging results that were available during my care of the patient were reviewed by me and considered in my medical decision making (see chart for details).       Patient presents with clinical concern for meniscal/ligamental injury.  X-ray reviewed no acute fracture.  Pain meds ordered.  Patient can only take NSAIDs limited.  Prescription for tramadol provided only for severe pain.  Patient has primary doctor follow-up in orthopedics given.  Final Clinical Impressions(s) / ED Diagnoses   Final diagnoses:  Acute pain of left knee    ED Discharge Orders         Ordered    traMADol (ULTRAM) 50 MG tablet  Every 6 hours PRN     10/08/18 0450           Blane OharaZavitz, Sultana Tierney, MD 10/08/18 [phone]

## 2018-10-08 NOTE — Progress Notes (Signed)
Orthopedic Tech Progress Note Patient Details:  Sarah English [birth year] [id number]  Ortho Devices Type of Ortho Device: Crutches, Knee Immobilizer Ortho Device/Splint Location: lle Ortho Device/Splint Interventions: Ordered, Adjustment, Application   Post Interventions Patient Tolerated: Well Instructions Provided: Care of device, Adjustment of device   [name] 10/08/2018, 5:00 AM

## 2018-10-17 ENCOUNTER — Encounter: Payer: Self-pay | Admitting: Internal Medicine

## 2018-10-17 ENCOUNTER — Ambulatory Visit: Payer: Medicaid Other | Admitting: Internal Medicine

## 2018-10-17 VITALS — BP 114/68 | HR 65 | Ht 65.0 in | Wt 309.0 lb

## 2018-10-17 DIAGNOSIS — I429 Cardiomyopathy, unspecified: Secondary | ICD-10-CM | POA: Diagnosis not present

## 2018-10-17 DIAGNOSIS — I5022 Chronic systolic (congestive) heart failure: Secondary | ICD-10-CM | POA: Diagnosis not present

## 2018-10-17 LAB — CBC WITH DIFFERENTIAL/PLATELET
BASOS ABS: 0.1 10*3/uL (ref 0.0–0.2)
Basos: 1 %
EOS (ABSOLUTE): 0.2 10*3/uL (ref 0.0–0.4)
Eos: 3 %
Hematocrit: 41.9 % (ref 34.0–46.6)
Hemoglobin: 13.6 g/dL (ref 11.1–15.9)
Immature Grans (Abs): 0 10*3/uL (ref 0.0–0.1)
Immature Granulocytes: 0 %
Lymphocytes Absolute: 2.4 10*3/uL (ref 0.7–3.1)
Lymphs: 38 %
MCH: 29.6 pg (ref 26.6–33.0)
MCHC: 32.5 g/dL (ref 31.5–35.7)
MCV: 91 fL (ref 79–97)
Monocytes Absolute: 0.5 10*3/uL (ref 0.1–0.9)
Monocytes: 7 %
Neutrophils Absolute: 3.2 10*3/uL (ref 1.4–7.0)
Neutrophils: 51 %
Platelets: 388 10*3/uL (ref 150–450)
RBC: 4.6 x10E6/uL (ref 3.77–5.28)
RDW: 12.3 % (ref 11.7–15.4)
WBC: 6.3 10*3/uL (ref 3.4–10.8)

## 2018-10-17 LAB — BASIC METABOLIC PANEL
BUN/Creatinine Ratio: 13 (ref 9–23)
BUN: 14 mg/dL (ref 6–24)
CHLORIDE: 100 mmol/L (ref 96–106)
CO2: 22 mmol/L (ref 20–29)
Calcium: 9.5 mg/dL (ref 8.7–10.2)
Creatinine, Ser: 1.08 mg/dL — ABNORMAL HIGH (ref 0.57–1.00)
GFR calc Af Amer: 73 mL/min/{1.73_m2} (ref >59)
GFR calc non Af Amer: 63 mL/min/{1.73_m2} (ref >59)
Glucose: 88 mg/dL (ref 65–99)
Potassium: 4.6 mmol/L (ref 3.5–5.2)
Sodium: 139 mmol/L (ref 134–144)

## 2018-10-17 NOTE — Progress Notes (Signed)
    HPI Sarah English is referred by Dr. Patwardhan for evaluation of and consideration for BiV ICD insertion. He is a pleasant 42 yo woman with multiple medical problems including Hashimoto thyroiditis, systemic sclerosis, Sjogrens and raynauds. She was found to have LBBB and then severe LV dysfunction and has been on maximal medical therapy. She continues to have class 3 CHF symptoms and her repeat EF by echo was 20%. She has not had syncope. Her peripheral edema has improved. Allergies  Allergen Reactions  . Sulfa Antibiotics Nausea And Vomiting     Current Outpatient Medications  Medication Sig Dispense Refill  . albuterol (PROVENTIL HFA;VENTOLIN HFA) 108 (90 Base) MCG/ACT inhaler Inhale into the lungs every 6 (six) hours as needed for wheezing or shortness of breath.    . Aloe-Sodium Chloride (AYR SALINE NASAL GEL NA) Place 1 spray into the nose daily.    . antiseptic oral rinse (BIOTENE) LIQD 1-2 application by Mouth Rinse route every hour as needed for dry mouth.    . CORLANOR 5 MG TABS tablet TAKE 1 TABLET BY MOUTH TWICE A DAY 60 tablet 5  . cyclobenzaprine (FLEXERIL) 10 MG tablet Take 10 mg by mouth 3 (three) times daily as needed for muscle spasms.    . cycloSPORINE (RESTASIS) 0.05 % ophthalmic emulsion Place 1 drop into both eyes 2 (two) times daily.     . etonogestrel-ethinyl estradiol (NUVARING) 0.12-0.015 MG/24HR vaginal ring Place 1 each vaginally every 28 (twenty-eight) days. Insert vaginally and leave in place for 3 consecutive weeks, then remove for 1 week.    . furosemide (LASIX) 20 MG tablet Take 20 mg by mouth daily.     . gabapentin (NEURONTIN) 300 MG capsule Take 1 capsule (300 mg total) by mouth 3 (three) times daily. 90 capsule 5  . HYDROcodone-acetaminophen (NORCO) 5-325 MG tablet Take 1 tablet by mouth every 6 (six) hours as needed for severe pain. 30 tablet 0  . Lactobacillus Rhamnosus, GG, (CULTURELLE PROBIOTICS KIDS PO) Take 1 tablet by mouth daily.    .  metoprolol succinate (TOPROL-XL) 50 MG 24 hr tablet Take 1 tablet (50 mg total) by mouth daily. 90 tablet 3  . omeprazole (PRILOSEC) 20 MG capsule Take 20 mg by mouth daily.    . sacubitril-valsartan (ENTRESTO) 97-103 MG Take 1 tablet by mouth 2 (two) times daily.     . selenium (SELENIMIN-200) 200 MCG TABS tablet Take 200 mcg by mouth daily.     . silver sulfADIAZINE (SILVADENE) 1 % cream Apply 1 application topically daily as needed (PAIN).    . spironolactone (ALDACTONE) 25 MG tablet Take 12.5 mg by mouth daily.    . SUMAtriptan (IMITREX) 100 MG tablet Take 100 mg by mouth every 6 (six) hours as needed for migraine. May repeat in 2 hours if headache persists or recurs.    . traMADol (ULTRAM) 50 MG tablet Take 1 tablet (50 mg total) by mouth every 6 (six) hours as needed. 8 tablet 0   No current facility-administered medications for this visit.      Past Medical History:  Diagnosis Date  . Anemia   . Autoimmune disorder (HCC)   . Bronchitis    hx -uses inhaler prn  . Chronic kidney disease    kidney infections  . Endometriosis   . Fibromyalgia   . GERD (gastroesophageal reflux disease)    occasional  . Hashimoto thyroiditis, fibrous variant   . Hashimoto's disease   . Headache    migraines  .   History of kidney stones   . Hypertension   . IVCD (intraventricular conduction defect)   . LBBB (left bundle branch block)   . Nonischemic cardiomyopathy (HCC)    per stress test 12-17-2017  ef 14%;  per echo 12-13-2017 ef 21%  . Peripheral vascular disease (HCC)    raynoids syndrome  . Pneumonia    only x 1 - 2- 3 months ago  . 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 Scleroderma (HCC)   . Sjogren's syndrome (HCC)   . Sleep apnea    recently dx 04/13/18, does not have CPAP machine yet.  . Systemic sclerosis (HCC)   . Systolic CHF with reduced left ventricular function, NYHA class 3 [facility])    cardiologist-  dr Evlyn Clines patwardhan  .  Tendinitis    lower back  . Thyroiditis     ROS:   All systems reviewed and negative except as noted in the HPI.   Past Surgical History:  Procedure Laterality Date  . CESAREAN SECTION  2004   x 1  . CHOLECYSTECTOMY    . COLONOSCOPY    . CYSTOSCOPY     x 2 - bladder as a child  . HYSTEROSCOPY W/D&C N/A 04/16/2018   Procedure: DILATATION AND CURETTAGE /HYSTEROSCOPY;  Surgeon: Lavina Hamman, MD;  Location: [facility];  Service: Gynecology;  Laterality: N/A;  . LAPAROSCOPIC LYSIS OF ADHESIONS  04/16/2018   Procedure: LAPAROSCOPIC LYSIS OF ADHESIONS;  Surgeon: Lavina Hamman, MD;  Location: [facility];  Service: Gynecology;;  . MANDIBLE FRACTURE SURGERY  1994   upper mandibular  . RIGHT/LEFT HEART CATH AND CORONARY ANGIOGRAPHY N/A 12/18/2017   Procedure: RIGHT/LEFT HEART CATH AND CORONARY ANGIOGRAPHY;  Surgeon: Elder Negus, MD;  Location: [facility];  Service: Cardiovascular;  Laterality: N/A;  . UPPER GI ENDOSCOPY    . WISDOM TOOTH EXTRACTION       Family History  Problem Relation Age of Onset  . Diabetes Mother        Type II  . Heart disease Mother        stent surgery  . Hypertension Mother   . Hyperlipidemia Mother   . Diabetes Father        Type II  . Hypertension Father   . Hyperlipidemia Father      Social History   Socioeconomic History  . Marital status: Single    Spouse name: Not on file  . Number of children: 1  . Years of education: 73  . Highest education level: Associate degree: occupational, Scientist, product/process development, or vocational program  Occupational History  . 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 Financial resource strain: Not on file  . Food insecurity:    Worry: Not on file    Inability: Not on file  . Transportation needs:    Medical: Not on file    Non-medical: Not on file  Tobacco Use  . Smoking status: Former Smoker    Packs/day: 0.50    Years: 8.00    Pack years: 4.00    Types: Cigarettes    Last attempt to quit: 12/10/2012     Years since quitting: 5.8  . Smokeless tobacco: Never Used  Substance and Sexual Activity  . Alcohol use: Yes    Comment: occasional wine  . Drug use: No  . Sexual activity: Yes    Birth control/protection: Pill  Lifestyle  . Physical activity:    Days  per week: Not on file    Minutes per session: Not on file  . Stress: Not on file  Relationships  . Social connections:    Talks on phone: Not on file    Gets together: Not on file    Attends religious service: Not on file    Active member of club or organization: Not on file    Attends meetings of clubs or organizations: Not on file    Relationship status: Not on file  . Intimate partner violence:    Fear of current or ex partner: Not on file    Emotionally abused: Not on file    Physically abused: Not on file    Forced sexual activity: Not on file  Other Topics Concern  . Not on file  Social History Narrative   Lives with son, renting a room from her mother.  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     Education: college.      BP 114/68   Pulse 65   Ht 5\' 5"  (1.651 m)   Wt (!) 309 lb (140.2 kg)   SpO2 98%   BMI 51.42 kg/m   Physical Exam:  Well appearing NAD HEENT: Unremarkable Neck:  No JVD, no thyromegally Lymphatics:  No adenopathy Back:  No CVA tenderness Lungs:  Clear with no wheezes HEART:  Regular rate rhythm, no murmurs, no rubs, no clicks Abd:  soft, positive bowel sounds, no organomegally, no rebound, no guarding Ext:  2 plus pulses, no edema, no cyanosis, no clubbing Skin:  No rashes no nodules Neuro:  CN II through XII intact, motor grossly intact  EKG - reviewed NSR  Assess/Plan: 1. Chronic systolic heart failure - her symptoms are class 3. She is on maximal medical therapy. I have discussed the indications/risks/benefits/goals/expectations of ICD insertion and she wishes to proceed. With her LBBB, she will have a LV lead placed. 2. Obesity - I discussed  the importance of weight loss with the patient.  [name].D.

## 2018-10-17 NOTE — H&P (View-Only) (Signed)
HPI Sarah English is referred by Dr. Rosemary Holms for evaluation of and consideration for BiV ICD insertion. He is a pleasant 43 yo woman with multiple medical problems including Hashimoto thyroiditis, systemic sclerosis, Sjogrens and raynauds. She was found to have LBBB and then severe LV dysfunction and has been on maximal medical therapy. She continues to have class 3 CHF symptoms and her repeat EF by echo was 20%. She has not had syncope. Her peripheral edema has improved. Allergies  Allergen Reactions  . Sulfa Antibiotics Nausea And Vomiting     Current Outpatient Medications  Medication Sig Dispense Refill  . albuterol (PROVENTIL HFA;VENTOLIN HFA) 108 (90 Base) MCG/ACT inhaler Inhale into the lungs every 6 (six) hours as needed for wheezing or shortness of breath.    . Aloe-Sodium Chloride (AYR SALINE NASAL GEL NA) Place 1 spray into the nose daily.    Marland Kitchen antiseptic oral rinse (BIOTENE) LIQD 1-2 application by Mouth Rinse route every hour as needed for dry mouth.    Retia Passe 5 MG TABS tablet TAKE 1 TABLET BY MOUTH TWICE A DAY 60 tablet 5  . cyclobenzaprine (FLEXERIL) 10 MG tablet Take 10 mg by mouth 3 (three) times daily as needed for muscle spasms.    . cycloSPORINE (RESTASIS) 0.05 % ophthalmic emulsion Place 1 drop into both eyes 2 (two) times daily.     Marland Kitchen etonogestrel-ethinyl estradiol (NUVARING) 0.12-0.015 MG/24HR vaginal ring Place 1 each vaginally every 28 (twenty-eight) days. Insert vaginally and leave in place for 3 consecutive weeks, then remove for 1 week.    . furosemide (LASIX) 20 MG tablet Take 20 mg by mouth daily.     Marland Kitchen gabapentin (NEURONTIN) 300 MG capsule Take 1 capsule (300 mg total) by mouth 3 (three) times daily. 90 capsule 5  . HYDROcodone-acetaminophen (NORCO) 5-325 MG tablet Take 1 tablet by mouth every 6 (six) hours as needed for severe pain. 30 tablet 0  . Lactobacillus Rhamnosus, GG, (CULTURELLE PROBIOTICS KIDS PO) Take 1 tablet by mouth daily.    .  metoprolol succinate (TOPROL-XL) 50 MG 24 hr tablet Take 1 tablet (50 mg total) by mouth daily. 90 tablet 3  . omeprazole (PRILOSEC) 20 MG capsule Take 20 mg by mouth daily.    . sacubitril-valsartan (ENTRESTO) 97-103 MG Take 1 tablet by mouth 2 (two) times daily.     Marland Kitchen selenium (SELENIMIN-200) 200 MCG TABS tablet Take 200 mcg by mouth daily.     . silver sulfADIAZINE (SILVADENE) 1 % cream Apply 1 application topically daily as needed (PAIN).    Marland Kitchen spironolactone (ALDACTONE) 25 MG tablet Take 12.5 mg by mouth daily.    . SUMAtriptan (IMITREX) 100 MG tablet Take 100 mg by mouth every 6 (six) hours as needed for migraine. May repeat in 2 hours if headache persists or recurs.    . traMADol (ULTRAM) 50 MG tablet Take 1 tablet (50 mg total) by mouth every 6 (six) hours as needed. 8 tablet 0   No current facility-administered medications for this visit.      Past Medical History:  Diagnosis Date  . Anemia   . Autoimmune disorder (HCC)   . Bronchitis    hx -uses inhaler prn  . Chronic kidney disease    kidney infections  . Endometriosis   . Fibromyalgia   . GERD (gastroesophageal reflux disease)    occasional  . Hashimoto thyroiditis, fibrous variant   . Hashimoto's disease   . Headache    migraines  .  History of kidney stones   . Hypertension   . IVCD (intraventricular conduction defect)   . LBBB (left bundle branch block)   . Nonischemic cardiomyopathy (HCC)    per stress test 12-17-2017  ef 14%;  per echo 12-13-2017 ef 21%  . Peripheral vascular disease (HCC)    raynoids syndrome  . Pneumonia    only x 1 - 2- 3 months ago  . 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 Scleroderma (HCC)   . Sjogren's syndrome (HCC)   . Sleep apnea    recently dx 04/13/18, does not have CPAP machine yet.  . Systemic sclerosis (HCC)   . Systolic CHF with reduced left ventricular function, NYHA class 3 [facility])    cardiologist-  dr Evlyn Clines patwardhan  .  Tendinitis    lower back  . Thyroiditis     ROS:   All systems reviewed and negative except as noted in the HPI.   Past Surgical History:  Procedure Laterality Date  . CESAREAN SECTION  2004   x 1  . CHOLECYSTECTOMY    . COLONOSCOPY    . CYSTOSCOPY     x 2 - bladder as a child  . HYSTEROSCOPY W/D&C N/A 04/16/2018   Procedure: DILATATION AND CURETTAGE /HYSTEROSCOPY;  Surgeon: Lavina Hamman, MD;  Location: [facility];  Service: Gynecology;  Laterality: N/A;  . LAPAROSCOPIC LYSIS OF ADHESIONS  04/16/2018   Procedure: LAPAROSCOPIC LYSIS OF ADHESIONS;  Surgeon: Lavina Hamman, MD;  Location: [facility];  Service: Gynecology;;  . MANDIBLE FRACTURE SURGERY  1994   upper mandibular  . RIGHT/LEFT HEART CATH AND CORONARY ANGIOGRAPHY N/A 12/18/2017   Procedure: RIGHT/LEFT HEART CATH AND CORONARY ANGIOGRAPHY;  Surgeon: Elder Negus, MD;  Location: [facility];  Service: Cardiovascular;  Laterality: N/A;  . UPPER GI ENDOSCOPY    . WISDOM TOOTH EXTRACTION       Family History  Problem Relation Age of Onset  . Diabetes Mother        Type II  . Heart disease Mother        stent surgery  . Hypertension Mother   . Hyperlipidemia Mother   . Diabetes Father        Type II  . Hypertension Father   . Hyperlipidemia Father      Social History   Socioeconomic History  . Marital status: Single    Spouse name: Not on file  . Number of children: 1  . Years of education: 73  . Highest education level: Associate degree: occupational, Scientist, product/process development, or vocational program  Occupational History  . 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 Financial resource strain: Not on file  . Food insecurity:    Worry: Not on file    Inability: Not on file  . Transportation needs:    Medical: Not on file    Non-medical: Not on file  Tobacco Use  . Smoking status: Former Smoker    Packs/day: 0.50    Years: 8.00    Pack years: 4.00    Types: Cigarettes    Last attempt to quit: 12/10/2012     Years since quitting: 5.8  . Smokeless tobacco: Never Used  Substance and Sexual Activity  . Alcohol use: Yes    Comment: occasional wine  . Drug use: No  . Sexual activity: Yes    Birth control/protection: Pill  Lifestyle  . Physical activity:    Days  per week: Not on file    Minutes per session: Not on file  . Stress: Not on file  Relationships  . Social connections:    Talks on phone: Not on file    Gets together: Not on file    Attends religious service: Not on file    Active member of club or organization: Not on file    Attends meetings of clubs or organizations: Not on file    Relationship status: Not on file  . Intimate partner violence:    Fear of current or ex partner: Not on file    Emotionally abused: Not on file    Physically abused: Not on file    Forced sexual activity: Not on file  Other Topics Concern  . Not on file  Social History Narrative   Lives with son, renting a room from her mother.  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     Education: college.      BP 114/68   Pulse 65   Ht 5\' 5"  (1.651 m)   Wt (!) 309 lb (140.2 kg)   SpO2 98%   BMI 51.42 kg/m   Physical Exam:  Well appearing NAD HEENT: Unremarkable Neck:  No JVD, no thyromegally Lymphatics:  No adenopathy Back:  No CVA tenderness Lungs:  Clear with no wheezes HEART:  Regular rate rhythm, no murmurs, no rubs, no clicks Abd:  soft, positive bowel sounds, no organomegally, no rebound, no guarding Ext:  2 plus pulses, no edema, no cyanosis, no clubbing Skin:  No rashes no nodules Neuro:  CN II through XII intact, motor grossly intact  EKG - reviewed NSR  Assess/Plan: 1. Chronic systolic heart failure - her symptoms are class 3. She is on maximal medical therapy. I have discussed the indications/risks/benefits/goals/expectations of ICD insertion and she wishes to proceed. With her LBBB, she will have a LV lead placed. 2. Obesity - I discussed  the importance of weight loss with the patient.  Sarah English.D.

## 2018-10-17 NOTE — Patient Instructions (Addendum)
Medication Instructions:  Your physician recommends that you continue on your current medications as directed. Please refer to the Current Medication list given to you today.  Labwork: You will get lab work today:  BMP and CBC.  Testing/Procedures: Your physician has recommended that you have a defibrillator inserted. An implantable cardioverter defibrillator (ICD) is a small device that is placed in your chest or, in rare cases, your abdomen. This device uses electrical pulses or shocks to help control life-threatening, irregular heartbeats that could lead the heart to suddenly stop beating (sudden cardiac arrest). Leads are attached to the ICD that goes into your heart. This is done in the hospital and usually requires an overnight stay. Please see the instruction sheet given to you today for more information.  Follow-Up: You will follow up with device clinic 10-14 days after your procedure for a wound check.  You will follow up with Dr. Ladona Ridgel 91 days after your procedure.   ICD PLACEMENT INSTRUCTIONS:  Please arrive to ADMITTING down the hall from the [address] main entrance of [facility] at:  5:30 AM ON October 25 2018  Use the CHG surgical scrub as directed  Do not eat or drink after midnight prior to procedure  On the morning of your procedure you may take your normal morning medications with a sip of water EXCEPT:  Lasix and spironolactone  Plan for one night stay  You will need someone to drive you home at discharge  If you need a refill on your cardiac medications before your next appointment, please call your pharmacy.   Cardioverter Defibrillator Implantation  An implantable cardioverter defibrillator (ICD) is a small device that is placed under the skin in the chest or abdomen. An ICD consists of a battery, a small computer (pulse generator), and wires (leads) that go into the heart. An ICD is used to detect and correct two types of dangerous irregular  heartbeats (arrhythmias):  A rapid heart rhythm (tachycardia).  An arrhythmia in which the lower chambers of the heart (ventricles) contract in an uncoordinated way (fibrillation). When an ICD detects tachycardia, it sends a low-energy shock to the heart to restore the heartbeat to normal (cardioversion). This signal is usually painless. If cardioversion does not work or if the ICD detects fibrillation, it delivers a high-energy shock to the heart (defibrillation) to restart the heart. This shock may feel like a strong jolt in the chest. Your health care provider may prescribe an ICD if:  You have had an arrhythmia that originated in the ventricles.  Your heart has been damaged by a disease or heart condition. Sometimes, ICDs are programmed to act as a device called a pacemaker. Pacemakers can be used to treat a slow heartbeat (bradycardia) or tachycardia by taking over the heart rate with electrical impulses. Tell a health care provider about:  Any allergies you have.  All medicines you are taking, including vitamins, herbs, eye drops, creams, and over-the-counter medicines.  Any problems you or family members have had with anesthetic medicines.  Any blood disorders you have.  Any surgeries you have had.  Any medical conditions you have.  Whether you are pregnant or may be pregnant. What are the risks? Generally, this is a safe procedure. However, problems may occur, including:  Swelling, bleeding, or bruising.  Infection.  Blood clots.  Damage to other structures or organs, such as nerves, blood vessels, or the heart.  Allergic reactions to medicines used during the procedure. What happens before the procedure?  Staying hydrated Follow instructions from your health care provider about hydration, which may include:  Up to 2 hours before the procedure - you may continue to drink clear liquids, such as water, clear fruit juice, black coffee, and plain tea. Eating and  drinking restrictions Follow instructions from your health care provider about eating and drinking, which may include:  8 hours before the procedure - stop eating heavy meals or foods such as meat, fried foods, or fatty foods.  6 hours before the procedure - stop eating light meals or foods, such as toast or cereal.  6 hours before the procedure - stop drinking milk or drinks that contain milk.  2 hours before the procedure - stop drinking clear liquids. Medicine Ask your health care provider about:  Changing or stopping your normal medicines. This is important if you take diabetes medicines or blood thinners.  Taking medicines such as aspirin and ibuprofen. These medicines can thin your blood. Do not take these medicines before your procedure if your doctor tells you not to. Tests  You may have blood tests.  You may have a test to check the electrical signals in your heart (electrocardiogram, ECG).  You may have imaging tests, such as a chest X-ray. General instructions  For 24 hours before the procedure, stop using products that contain nicotine or tobacco, such as cigarettes and e-cigarettes. If you need help quitting, ask your health care provider.  Plan to have someone take you home from the hospital or clinic.  You may be asked to shower with a germ-killing soap. What happens during the procedure?  To reduce your risk of infection: ? Your health care team will wash or sanitize their hands. ? Your skin will be washed with soap. ? Hair may be removed from the surgical area.  Small monitors will be put on your body. They will be used to check your heart, blood pressure, and oxygen level.  An IV tube will be inserted into one of your veins.  You will be given one or more of the following: ? A medicine to help you relax (sedative). ? A medicine to numb the area (local anesthetic). ? A medicine to make you fall asleep (general anesthetic).  Leads will be guided through a  blood vessel into your heart and attached to your heart muscles. Depending on the ICD, the leads may go into one ventricle or they may go into both ventricles and into an upper chamber of the heart. An X-ray machine (fluoroscope) will be usedto help guide the leads.  A small incision will be made to create a deep pocket under your skin.  The pulse generator will be placed into the pocket.  The ICD will be tested.  The incision will be closed with stitches (sutures), skin glue, or staples.  A bandage (dressing) will be placed over the incision. This procedure may vary among health care providers and hospitals. What happens after the procedure?  Your blood pressure, heart rate, breathing rate, and blood oxygen level will be monitored often until the medicines you were given have worn off.  A chest X-ray will be taken to check that the ICD is in the right place.  You will need to stay in the hospital for 1-2 days so your health care provider can make sure your ICD is working.  Do not drive for 24 hours if you received a sedative. Ask your health care provider when it is safe for you to drive.  You may  be given an identification card explaining that you have an ICD. Summary  An implantable cardioverter defibrillator (ICD) is a small device that is placed under the skin in the chest or abdomen. It is used to detect and correct dangerous irregular heartbeats (arrhythmias).  An ICD consists of a battery, a small computer (pulse generator), and wires (leads) that go into the heart.  When an ICD detects rapid heart rhythm (tachycardia), it sends a low-energy shock to the heart to restore the heartbeat to normal (cardioversion). If cardioversion does not work or if the ICD detects uncoordinated heart contractions (fibrillation), it delivers a high-energy shock to the heart (defibrillation) to restart the heart.  You will need to stay in the hospital for 1-2 days to make sure your ICD is  working. This information is not intended to replace advice given to you by your health care provider. Make sure you discuss any questions you have with your health care provider. Document Released: 04/29/2002 Document Revised: 08/16/2016 Document Reviewed: 08/16/2016 Elsevier Interactive Patient Education  2019 [employer].

## 2018-10-25 ENCOUNTER — Encounter (HOSPITAL_COMMUNITY)
Admission: RE | Disposition: A | Discharge status: Home / Self Care | Payer: Self-pay | Source: Home / Self Care | Attending: Internal Medicine

## 2018-10-25 ENCOUNTER — Ambulatory Visit (HOSPITAL_COMMUNITY)
Admission: RE | Admit: 2018-10-25 | Discharge: 2018-10-26 | Disposition: A | Discharge status: Home / Self Care | Payer: Medicaid Other | Attending: Internal Medicine | Admitting: Internal Medicine

## 2018-10-25 ENCOUNTER — Other Ambulatory Visit: Payer: Self-pay

## 2018-10-25 ENCOUNTER — Encounter (HOSPITAL_COMMUNITY): Payer: Self-pay | Admitting: Internal Medicine

## 2018-10-25 DIAGNOSIS — K219 Gastro-esophageal reflux disease without esophagitis: Secondary | ICD-10-CM | POA: Diagnosis not present

## 2018-10-25 DIAGNOSIS — M797 Fibromyalgia: Secondary | ICD-10-CM | POA: Insufficient documentation

## 2018-10-25 DIAGNOSIS — I428 Other cardiomyopathies: Secondary | ICD-10-CM | POA: Insufficient documentation

## 2018-10-25 DIAGNOSIS — M349 Systemic sclerosis, unspecified: Secondary | ICD-10-CM | POA: Diagnosis not present

## 2018-10-25 DIAGNOSIS — I447 Left bundle-branch block, unspecified: Secondary | ICD-10-CM | POA: Insufficient documentation

## 2018-10-25 DIAGNOSIS — Z8249 Family history of ischemic heart disease and other diseases of the circulatory system: Secondary | ICD-10-CM | POA: Diagnosis not present

## 2018-10-25 DIAGNOSIS — M3500 Sicca syndrome, unspecified: Secondary | ICD-10-CM | POA: Diagnosis not present

## 2018-10-25 DIAGNOSIS — N189 Chronic kidney disease, unspecified: Secondary | ICD-10-CM | POA: Insufficient documentation

## 2018-10-25 DIAGNOSIS — E063 Autoimmune thyroiditis: Secondary | ICD-10-CM | POA: Diagnosis not present

## 2018-10-25 DIAGNOSIS — E669 Obesity, unspecified: Secondary | ICD-10-CM | POA: Diagnosis not present

## 2018-10-25 DIAGNOSIS — Z882 Allergy status to sulfonamides status: Secondary | ICD-10-CM | POA: Insufficient documentation

## 2018-10-25 DIAGNOSIS — Z006 Encounter for examination for normal comparison and control in clinical research program: Secondary | ICD-10-CM | POA: Diagnosis not present

## 2018-10-25 DIAGNOSIS — Z6841 Body Mass Index (BMI) 40.0 and over, adult: Secondary | ICD-10-CM | POA: Diagnosis not present

## 2018-10-25 DIAGNOSIS — I130 Hypertensive heart and chronic kidney disease with heart failure and stage 1 through stage 4 chronic kidney disease, or unspecified chronic kidney disease: Secondary | ICD-10-CM | POA: Diagnosis not present

## 2018-10-25 DIAGNOSIS — Z23 Encounter for immunization: Secondary | ICD-10-CM | POA: Diagnosis not present

## 2018-10-25 DIAGNOSIS — I272 Pulmonary hypertension, unspecified: Secondary | ICD-10-CM | POA: Insufficient documentation

## 2018-10-25 DIAGNOSIS — I5023 Acute on chronic systolic (congestive) heart failure: Secondary | ICD-10-CM | POA: Diagnosis present

## 2018-10-25 DIAGNOSIS — I7300 Raynaud's syndrome without gangrene: Secondary | ICD-10-CM | POA: Insufficient documentation

## 2018-10-25 DIAGNOSIS — Z79899 Other long term (current) drug therapy: Secondary | ICD-10-CM | POA: Diagnosis not present

## 2018-10-25 DIAGNOSIS — I5022 Chronic systolic (congestive) heart failure: Secondary | ICD-10-CM | POA: Diagnosis present

## 2018-10-25 DIAGNOSIS — Z87891 Personal history of nicotine dependence: Secondary | ICD-10-CM | POA: Diagnosis not present

## 2018-10-25 HISTORY — PX: BIV ICD INSERTION CRT-D: EP1195

## 2018-10-25 LAB — SURGICAL PCR SCREEN
MRSA, PCR: NEGATIVE
STAPHYLOCOCCUS AUREUS: POSITIVE — AB

## 2018-10-25 LAB — PREGNANCY, URINE: Preg Test, Ur: NEGATIVE

## 2018-10-25 SURGERY — BIV ICD INSERTION CRT-D

## 2018-10-25 MED ORDER — MIDAZOLAM HCL 5 MG/5ML IJ SOLN
INTRAMUSCULAR | Status: AC
  Filled 2018-10-25: qty 5

## 2018-10-25 MED ORDER — ONDANSETRON HCL 4 MG/2ML IJ SOLN: 4.0000 mg | Freq: Four times a day (QID) | INTRAMUSCULAR | Status: DC | PRN

## 2018-10-25 MED ORDER — CHLORHEXIDINE GLUCONATE 4 % EX LIQD
60.0000 mL | Freq: Once | CUTANEOUS | Status: DC
  Filled 2018-10-25: qty 60

## 2018-10-25 MED ORDER — GABAPENTIN 300 MG PO CAPS
300.0000 mg | ORAL_CAPSULE | Freq: Three times a day (TID) | ORAL | Status: DC
  Administered 2018-10-25 – 2018-10-26 (×4): 300 mg via ORAL
  Filled 2018-10-25 (×4): qty 1

## 2018-10-25 MED ORDER — MUPIROCIN 2 % EX OINT
TOPICAL_OINTMENT | Freq: Once | CUTANEOUS | Status: DC
  Filled 2018-10-25 (×2): qty 22

## 2018-10-25 MED ORDER — HEPARIN (PORCINE) IN NACL 1000-0.9 UT/500ML-% IV SOLN
INTRAVENOUS | Status: DC | PRN
  Administered 2018-10-25: 500 mL

## 2018-10-25 MED ORDER — LIDOCAINE HCL (PF) 1 % IJ SOLN
INTRAMUSCULAR | Status: AC
  Filled 2018-10-25: qty 30

## 2018-10-25 MED ORDER — SUMATRIPTAN SUCCINATE 100 MG PO TABS
100.0000 mg | ORAL_TABLET | Freq: Four times a day (QID) | ORAL | Status: DC | PRN
  Filled 2018-10-25: qty 1

## 2018-10-25 MED ORDER — SELENIUM 200 MCG PO TABS
200.0000 ug | ORAL_TABLET | Freq: Every day | ORAL | Status: DC
  Administered 2018-10-25 – 2018-10-26 (×2): 200 ug via ORAL
  Filled 2018-10-25 (×2): qty 1

## 2018-10-25 MED ORDER — FENTANYL CITRATE (PF) 100 MCG/2ML IJ SOLN
INTRAMUSCULAR | Status: AC
  Filled 2018-10-25: qty 2

## 2018-10-25 MED ORDER — SODIUM CHLORIDE 0.9 % IV SOLN
INTRAVENOUS | Status: AC
  Filled 2018-10-25: qty 2

## 2018-10-25 MED ORDER — BIOTENE DRY MOUTH MT LIQD: 1.0000 "application " | OROMUCOSAL | Status: DC | PRN

## 2018-10-25 MED ORDER — CEFAZOLIN SODIUM-DEXTROSE 1-4 GM/50ML-% IV SOLN
1.0000 g | Freq: Four times a day (QID) | INTRAVENOUS | Status: AC
  Administered 2018-10-25 – 2018-10-26 (×3): 1 g via INTRAVENOUS
  Filled 2018-10-25 (×3): qty 50

## 2018-10-25 MED ORDER — ACETAMINOPHEN 325 MG PO TABS
325.0000 mg | ORAL_TABLET | ORAL | Status: DC | PRN
  Administered 2018-10-25: 650 mg via ORAL
  Filled 2018-10-25: qty 2

## 2018-10-25 MED ORDER — IOHEXOL 350 MG/ML SOLN
INTRAVENOUS | Status: DC | PRN
  Administered 2018-10-25 (×2): 15 mL via INTRAVENOUS

## 2018-10-25 MED ORDER — LIDOCAINE HCL (PF) 1 % IJ SOLN
INTRAMUSCULAR | Status: DC | PRN
  Administered 2018-10-25: 50 mL

## 2018-10-25 MED ORDER — HYDROCODONE-ACETAMINOPHEN 5-325 MG PO TABS
1.0000 | ORAL_TABLET | Freq: Four times a day (QID) | ORAL | Status: DC | PRN
  Administered 2018-10-25 – 2018-10-26 (×4): 1 via ORAL
  Filled 2018-10-25 (×4): qty 1

## 2018-10-25 MED ORDER — CYCLOSPORINE 0.05 % OP EMUL
1.0000 [drp] | Freq: Two times a day (BID) | OPHTHALMIC | Status: DC
  Administered 2018-10-25 – 2018-10-26 (×2): 1 [drp] via OPHTHALMIC
  Filled 2018-10-25 (×3): qty 30

## 2018-10-25 MED ORDER — CYCLOBENZAPRINE HCL 10 MG PO TABS: 10.0000 mg | ORAL_TABLET | Freq: Three times a day (TID) | ORAL | Status: DC | PRN

## 2018-10-25 MED ORDER — HEPARIN (PORCINE) IN NACL 1000-0.9 UT/500ML-% IV SOLN
INTRAVENOUS | Status: AC
  Filled 2018-10-25: qty 500

## 2018-10-25 MED ORDER — FUROSEMIDE 20 MG PO TABS
20.0000 mg | ORAL_TABLET | Freq: Every day | ORAL | Status: DC
  Administered 2018-10-25 – 2018-10-26 (×2): 20 mg via ORAL
  Filled 2018-10-25 (×2): qty 1

## 2018-10-25 MED ORDER — FENTANYL CITRATE (PF) 100 MCG/2ML IJ SOLN
INTRAMUSCULAR | Status: DC | PRN
  Administered 2018-10-25 (×2): 12.5 ug via INTRAVENOUS
  Administered 2018-10-25: 25 ug via INTRAVENOUS
  Administered 2018-10-25 (×7): 12.5 ug via INTRAVENOUS
  Administered 2018-10-25: 25 ug via INTRAVENOUS
  Administered 2018-10-25: 12.5 ug via INTRAVENOUS

## 2018-10-25 MED ORDER — SODIUM CHLORIDE 0.9 % IV SOLN
80.0000 mg | INTRAVENOUS | Status: AC
  Administered 2018-10-25: 80 mg
  Filled 2018-10-25: qty 2

## 2018-10-25 MED ORDER — PNEUMOCOCCAL VAC POLYVALENT 25 MCG/0.5ML IJ INJ
0.5000 mL | INJECTION | INTRAMUSCULAR | Status: AC
  Administered 2018-10-26: 0.5 mL via INTRAMUSCULAR
  Filled 2018-10-25: qty 0.5

## 2018-10-25 MED ORDER — AYR SALINE NASAL NA GEL: 1.0000 "application " | Freq: Every day | NASAL | Status: DC

## 2018-10-25 MED ORDER — SACUBITRIL-VALSARTAN 97-103 MG PO TABS
1.0000 | ORAL_TABLET | Freq: Two times a day (BID) | ORAL | Status: DC
  Administered 2018-10-25 – 2018-10-26 (×2): 1 via ORAL
  Filled 2018-10-25 (×3): qty 1

## 2018-10-25 MED ORDER — SPIRONOLACTONE 12.5 MG HALF TABLET
12.5000 mg | ORAL_TABLET | Freq: Every day | ORAL | Status: DC
  Administered 2018-10-25 – 2018-10-26 (×2): 12.5 mg via ORAL
  Filled 2018-10-25 (×2): qty 1

## 2018-10-25 MED ORDER — IVABRADINE HCL 5 MG PO TABS
5.0000 mg | ORAL_TABLET | Freq: Two times a day (BID) | ORAL | Status: DC
  Administered 2018-10-25 – 2018-10-26 (×2): 5 mg via ORAL
  Filled 2018-10-25 (×2): qty 1

## 2018-10-25 MED ORDER — METOPROLOL SUCCINATE ER 50 MG PO TB24
50.0000 mg | ORAL_TABLET | Freq: Every day | ORAL | Status: DC
  Administered 2018-10-26: 50 mg via ORAL
  Filled 2018-10-25 (×2): qty 1

## 2018-10-25 MED ORDER — SALINE SPRAY 0.65 % NA SOLN
1.0000 | Freq: Every day | NASAL | Status: DC
  Administered 2018-10-26: 1 via NASAL
  Filled 2018-10-25: qty 44

## 2018-10-25 MED ORDER — DEXTROSE 5 % IV SOLN
3.0000 g | INTRAVENOUS | Status: AC
  Administered 2018-10-25: 3 g via INTRAVENOUS
  Filled 2018-10-25 (×2): qty 3000

## 2018-10-25 MED ORDER — MIDAZOLAM HCL 5 MG/5ML IJ SOLN
INTRAMUSCULAR | Status: DC | PRN
  Administered 2018-10-25 (×7): 1 mg via INTRAVENOUS
  Administered 2018-10-25: 2 mg via INTRAVENOUS
  Administered 2018-10-25 (×3): 1 mg via INTRAVENOUS

## 2018-10-25 MED ORDER — SODIUM CHLORIDE 0.9 % IV SOLN
INTRAVENOUS | Status: DC
  Administered 2018-10-25: 07:00:00 via INTRAVENOUS

## 2018-10-25 MED ORDER — ETONOGESTREL-ETHINYL ESTRADIOL 0.12-0.015 MG/24HR VA RING: 1.0000 | VAGINAL_RING | VAGINAL | Status: DC

## 2018-10-25 MED ORDER — OLOPATADINE HCL 0.7 % OP SOLN: 1.0000 [drp] | Freq: Every day | OPHTHALMIC | Status: DC

## 2018-10-25 SURGICAL SUPPLY — 21 items
ASSURA CRTD CD3369-40C (ICD Generator) ×3 IMPLANT
BALLN COR SINUS VENO 6F 80CM (BALLOONS) ×1
BALLN COR SINUS VENO 6FR 80 (BALLOONS) ×2
BALLOON COR SINUS VENO 6FR 80 (BALLOONS) ×1 IMPLANT
CABLE SURGICAL S-101-97-12 (CABLE) ×3 IMPLANT
CATH CPS DIRECT 135 DS2C020 (CATHETERS) ×3 IMPLANT
CATH HEX JOS 2-5-2 65CM 6F REP (CATHETERS) ×3 IMPLANT
CPS IMPLANT KIT 410190 (MISCELLANEOUS) ×3 IMPLANT
DEFIB ASSURA CRT-D (ICD Generator) ×1 IMPLANT
LEAD DURATA 7122-65CM (Lead) ×3 IMPLANT
LEAD QUARTET 1456Q-86 (Lead) ×1 IMPLANT
LEAD TENDRIL MRI 52CM LPA1200M (Lead) ×3 IMPLANT
PAD PRO RADIOLUCENT 2001M-C (PAD) ×3 IMPLANT
QUARTET 1456Q-86 (Lead) ×3 IMPLANT
SHEATH CLASSIC 7F (SHEATH) ×3 IMPLANT
SHEATH CLASSIC 8F (SHEATH) ×3 IMPLANT
SHEATH WORLEY 9FR 62CM (SHEATH) ×3 IMPLANT
SLITTER UNIVERSAL DS2A003 (MISCELLANEOUS) ×6 IMPLANT
TRAY PACEMAKER INSERTION (PACKS) ×3 IMPLANT
WIRE ACUITY WHISPER EDS 4648 (WIRE) ×3 IMPLANT
WIRE LUGE 182CM (WIRE) ×3 IMPLANT

## 2018-10-25 NOTE — H&P (Signed)
I have seen Sarah English is a 43 y.o. female in the office today who has been referred by Dr. Jonette Eva for consideration of ICD implant for primary prevention of sudden death.  The patient's chart has been reviewed and they meet criteria for ICD implant.  I have had a thorough discussion with the patient reviewing options.  The patient and their family (if available) have had opportunities to ask questions and have them answered. The patient and I have decided together through a shared decision making process to proceed ICD at this time.  Risks, benefits, alternatives to ICD implantation were discussed in detail with the patient today. The patient  understands that the risks include but are not limited to bleeding, infection, pneumothorax, perforation, tamponade, vascular damage, renal failure, MI, stroke, death, inappropriate shocks, and lead dislodgement and she wishes to proceed.  We will therefore schedule device implantation at the next available time.  [name].D.

## 2018-10-25 NOTE — Progress Notes (Signed)
Orthopedic Tech Progress Note Patient Details:  Sarah English May 17, 1976 [id number] RN said patient has on sling Patient ID: Sarah English, female   DOB: [birth year], 43 y.o.   MRN: [id number]   Donald Pore 10/25/2018, 12:00 PM

## 2018-10-25 NOTE — Discharge Summary (Signed)
ELECTROPHYSIOLOGY PROCEDURE DISCHARGE SUMMARY    Patient ID: Sarah English,  MRN: [id number], DOB/AGE: 1976/01/16 43 y.o.  Admit date: 10/25/2018 Discharge date: 10/26/2018  Primary Care Physician: Aida Puffer, MD  Primary Cardiologist: Dr. Aggie Cosier Electrophysiologist: Dr. Ladona Ridgel  Primary Discharge Diagnosis:  1. NICM 2. LBBB 3. Chronic CHF (systolic)     NYHA Class III  Secondary Discharge Diagnosis:  1. Hashimoto thryroiitis 2. Systemic sclerosis     Sjogren's     Reynaud's 3. Morbid obesity 4. HTN 5. OSA  Allergies  Allergen Reactions  . Sulfa Antibiotics Nausea And Vomiting     Procedures This Admission:  1.  Implantation of a SJM CRT-D on 10/25/2018 by Dr Ladona Ridgel.  The patient received a St. Jude (serial # [id number] ) right atrial lead and a St. Jude (serial number [id number]) right ventricular defibrillator lead, St.Jude(serial number [id number]) lead (LV)  DFT's were deferred at time of implant.   There were no immediate post procedure complications. 2.  CXR on demonstrated no pneumothorax status post device implantation.   Brief HPI: Sarah English is a 42 y.o. female was referred to electrophysiology in the outpatient setting for consideration of ICD implantation.  Past medical history includes above.  The patient has persistent LV dysfunction despite guideline directed therapy.  Risks, benefits, and alternatives to ICD implantation were reviewed with the patient who wished to proceed.   Hospital Course:  The patient was admitted and underwent implantation of an ICD with details as outlined above. She was monitored on telemetry overnight which demonstrated sinus rhythm with Biv pacing.  Left chest was without hematoma or ecchymosis.  The device was interrogated and found to be functioning normally.  CXR was obtained and demonstrated no pneumothorax status post device implantation.  Wound care, arm mobility, and restrictions were reviewed with the patient.  The  patient feels well this morning, no CP or SOB, she was examined by Dr. Johney Frame and considered stable for discharge to home.   The patient's discharge medications include an ARB (Entresto) and beta blocker (metoprolol).   Physical Exam: Vitals:   10/25/18 1300 10/25/18 1919 10/26/18 0505 10/26/18 0847  BP: (!) 145/82 118/65 121/70 117/75  Pulse: 80 87 80 79  Resp:  18 18   Temp:  99.5 F (37.5 C) 98.8 F (37.1 C)   TempSrc:  Oral Oral   SpO2:  97% 98%   Weight:   (!) 138.3 kg   Height:        GEN- The patient is morbidly obese appearing, alert and oriented x 3 today.   HEENT: normocephalic, atraumatic; sclera clear, conjunctiva pink; hearing intact; oropharynx clear Lungs-  CTA b/l, normal work of breathing.  No wheezes, rales, rhonchi Heart-  RRR, no murmurs, rubs or gallops, PMI not laterally displaced GI- soft, non-tender, non-distended Extremities- no clubbing, cyanosis, or edema MS- no significant deformity or atrophy Skin- warm and dry, no rash or lesion, left  chest without hematoma/ecchymosis Psych- euthymic mood, full affect Neuro- no gross defecits  Labs:   Lab Results  Component Value Date   WBC 6.3 10/17/2018   HGB 13.6 10/17/2018   HCT 41.9 10/17/2018   MCV 91 10/17/2018   PLT 388 10/17/2018   No results for input(s): NA, K, CL, CO2, BUN, CREATININE, CALCIUM, PROT, BILITOT, ALKPHOS, ALT, AST, GLUCOSE in the last 168 hours.  Invalid input(s): LABALBU  Discharge Medications:  Allergies as of 10/26/2018      Reactions   Sulfa Antibiotics Nausea  And Vomiting      Medication List    TAKE these medications   albuterol 108 (90 Base) MCG/ACT inhaler Commonly known as:  PROVENTIL HFA;VENTOLIN HFA Inhale into the lungs every 6 (six) hours as needed for wheezing or shortness of breath.   antiseptic oral rinse Liqd 1-2 application by Mouth Rinse route every hour as needed for dry mouth.   CAL-MAG-ZINC PO Take 1 tablet by mouth daily.   Corlanor 5 MG Tabs  tablet Generic drug:  ivabradine TAKE 1 TABLET BY MOUTH TWICE A DAY What changed:  how much to take   CULTURELLE PROBIOTICS KIDS PO Take 1 tablet by mouth daily.   cyclobenzaprine 10 MG tablet Commonly known as:  FLEXERIL Take 10 mg by mouth 3 (three) times daily as needed for muscle spasms.   cycloSPORINE 0.05 % ophthalmic emulsion Commonly known as:  RESTASIS Place 1 drop into both eyes 2 (two) times daily.   etonogestrel-ethinyl estradiol 0.12-0.015 MG/24HR vaginal ring Commonly known as:  NUVARING Place 1 each vaginally every 28 (twenty-eight) days. Insert vaginally and leave in place for 4 consecutive weeks   furosemide 20 MG tablet Commonly known as:  LASIX Take 20 mg by mouth daily.   gabapentin 300 MG capsule Commonly known as:  Neurontin Take 1 capsule (300 mg total) by mouth 3 (three) times daily. What changed:    when to take this  reasons to take this   HYDROcodone-acetaminophen 5-325 MG tablet Commonly known as:  Norco Take 1 tablet by mouth every 6 (six) hours as needed for severe pain.   metoprolol succinate 50 MG 24 hr tablet Commonly known as:  TOPROL-XL Take 1 tablet (50 mg total) by mouth daily.   omeprazole 20 MG capsule Commonly known as:  PRILOSEC Take 20 mg by mouth daily.   Pazeo 0.7 % Soln Generic drug:  Olopatadine HCl Place 1 drop into both eyes daily.   sacubitril-valsartan 97-103 MG Commonly known as:  ENTRESTO Take 1 tablet by mouth 2 (two) times daily.   saline Gel Place 1 application into the nose daily.   Selenimin-200 200 MCG Tabs tablet Generic drug:  selenium Take 200 mcg by mouth daily.   silver sulfADIAZINE 1 % cream Commonly known as:  SILVADENE Apply 1 application topically 2 (two) times daily as needed (wounds/affected area(s) of skin.).   spironolactone 25 MG tablet Commonly known as:  ALDACTONE Take 12.5 mg by mouth daily.   SUMAtriptan 100 MG tablet Commonly known as:  IMITREX Take 100 mg by mouth every  6 (six) hours as needed for migraine. May repeat in 2 hours if headache persists or recurs.   traMADol 50 MG tablet Commonly known as:  ULTRAM Take 1 tablet (50 mg total) by mouth every 6 (six) hours as needed.       Disposition:  Home   Follow-up Information    [facility] Follow up.   Specialty:  Cardiology Why:  11/04/2018 @ 8:30AM, wound check visit Contact information: [address] [place] [address] [phone]       Marinus Maw, MD Follow up.   Specialty:  Cardiology Why:  02/11/2019 @ 4:30PM Contact information: [address] [place] [address] [phone]           Duration of Discharge Encounter: Greater than 30 minutes including physician time.  Randolm Idol MD, [facility] 10/26/2018 11:10 AM

## 2018-10-25 NOTE — Discharge Instructions (Signed)
° ° °  Supplemental Discharge Instructions for  Pacemaker/Defibrillator Patients  Activity No heavy lifting or vigorous activity with your left/right arm for 6 to 8 weeks.  Do not raise your left/right arm above your head for one week.  Gradually raise your affected arm as drawn below.             10/29/2018                 10/30/2018               10/31/2018               11/01/2018 __  NO DRIVING for 1 week    ; you may begin driving on  0/05/9322   .  WOUND CARE - Keep the wound area clean and dry.  Do not get this area wet for one week. No showers for one week; you may shower on 11/01/2018  . - The tape/steri-strips on your wound will fall off; do not pull them off.  No bandage is needed on the site.  DO  NOT apply any creams, oils, or ointments to the wound area. - If you notice any drainage or discharge from the wound, any swelling or bruising at the site, or you develop a fever > 101? F after you are discharged home, call the office at once.  Special Instructions - You are still able to use cellular telephones; use the ear opposite the side where you have your pacemaker/defibrillator.  Avoid carrying your cellular phone near your device. - When traveling through airports, show security personnel your identification card to avoid being screened in the metal detectors.  Ask the security personnel to use the hand wand. - Avoid arc welding equipment, MRI testing (magnetic resonance imaging), TENS units (transcutaneous nerve stimulators).  Call the office for questions about other devices. - Avoid electrical appliances that are in poor condition or are not properly grounded. - Microwave ovens are safe to be near or to operate.  Additional information for defibrillator patients should your device go off: - If your device goes off ONCE and you feel fine afterward, notify the device clinic nurses. - If your device goes off ONCE and you do not feel well afterward, call 911. - If your device goes  off TWICE, call 911. - If your device goes off THREE times in one day, call 911.  DO NOT DRIVE YOURSELF OR A FAMILY MEMBER WITH A DEFIBRILLATOR TO THE HOSPITAL--CALL 911.

## 2018-10-25 NOTE — Interval H&P Note (Signed)
History and Physical Interval Note:  10/25/2018 7:30 AM  Sarah English  has presented today for surgery, with the diagnosis of HF  The various methods of treatment have been discussed with the patient and family. After consideration of risks, benefits and other options for treatment, the patient has consented to  Procedure(s): BIV ICD INSERTION CRT-D (N/A) as a surgical intervention .  The patient's history has been reviewed, patient examined, no change in status, stable for surgery.  I have reviewed the patient's chart and labs.  Questions were answered to the patient's satisfaction.     Lewayne Bunting

## 2018-10-26 ENCOUNTER — Ambulatory Visit (HOSPITAL_COMMUNITY): Payer: Medicaid Other

## 2018-10-26 DIAGNOSIS — I447 Left bundle-branch block, unspecified: Secondary | ICD-10-CM

## 2018-10-26 DIAGNOSIS — Z006 Encounter for examination for normal comparison and control in clinical research program: Secondary | ICD-10-CM | POA: Diagnosis not present

## 2018-10-26 DIAGNOSIS — I428 Other cardiomyopathies: Secondary | ICD-10-CM | POA: Diagnosis not present

## 2018-10-26 DIAGNOSIS — I5022 Chronic systolic (congestive) heart failure: Secondary | ICD-10-CM | POA: Diagnosis not present

## 2018-10-26 DIAGNOSIS — I130 Hypertensive heart and chronic kidney disease with heart failure and stage 1 through stage 4 chronic kidney disease, or unspecified chronic kidney disease: Secondary | ICD-10-CM | POA: Diagnosis not present

## 2018-10-28 ENCOUNTER — Telehealth: Payer: Self-pay | Admitting: Internal Medicine

## 2018-10-28 NOTE — Telephone Encounter (Signed)
I spoke to the patient who has questions in regards to the antibiotics which she received while in the hospital.  I deferred her to her PCP (Dr Aida Puffer).  She verbalized understanding and was thankful for the call.

## 2018-10-28 NOTE — Telephone Encounter (Signed)
Pt was in hospital on Friday, and her bloodwork came back positive for  Staph. Aureaus, but MRSA came back negative  Pt wants to know if the antibiotics given in her IV will take care of the infection, or if she needs another dose of antibiotics

## 2018-10-28 NOTE — Telephone Encounter (Signed)
That was a nasal swab.

## 2018-11-01 ENCOUNTER — Telehealth: Payer: Self-pay | Admitting: Internal Medicine

## 2018-11-01 ENCOUNTER — Telehealth: Payer: Self-pay | Admitting: Cardiology

## 2018-11-01 NOTE — Telephone Encounter (Signed)
-----   Message from Crystal sent at 11/01/2018  9:58 AM EDT ----- Regarding: Medication Patient is having swelling in her hands and feet and would like to know if it is ok for her to double up on her furosemide. If not what should she do?

## 2018-11-01 NOTE — Telephone Encounter (Signed)
New Message         Pt c/o swelling: STAT is pt has developed SOB within 24 hours  1) How much weight have you gained and in what time span? Not sure  2) If swelling, where is the swelling located? Lower leg/ankle/hands  3) Are you currently taking a fluid pill? Yes  4) Are you currently SOB? No  5) Do you have a log of your daily weights (if so, list)? No   Have you gained 3 pounds in a day or 5 pounds in a week? No  6) Have you traveled recently? No             Patient is asking if she can take another fluid pill? Pls call and advise.

## 2018-11-01 NOTE — Telephone Encounter (Signed)
Advised via MyChart to follow up with her general cardiologist Dr. Rosemary Holms.

## 2018-11-01 NOTE — Telephone Encounter (Signed)
Okay with me 

## 2018-11-04 ENCOUNTER — Other Ambulatory Visit: Payer: Self-pay

## 2018-11-04 ENCOUNTER — Ambulatory Visit (INDEPENDENT_AMBULATORY_CARE_PROVIDER_SITE_OTHER): Payer: Medicaid Other | Admitting: *Deleted

## 2018-11-04 DIAGNOSIS — Z95810 Presence of automatic (implantable) cardiac defibrillator: Secondary | ICD-10-CM | POA: Diagnosis not present

## 2018-11-04 DIAGNOSIS — I5022 Chronic systolic (congestive) heart failure: Secondary | ICD-10-CM | POA: Diagnosis not present

## 2018-11-04 DIAGNOSIS — I429 Cardiomyopathy, unspecified: Secondary | ICD-10-CM | POA: Diagnosis not present

## 2018-11-04 LAB — CUP PACEART INCLINIC DEVICE CHECK
Battery Remaining Longevity: 87 mo
Brady Statistic RV Percent Paced: 99.64 %
HighPow Impedance: 60.75 Ohm
Implantable Lead Implant Date: 20200306
Implantable Lead Implant Date: 20200306
Implantable Lead Implant Date: 20200306
Implantable Lead Location: 753858
Implantable Lead Location: 753859
Implantable Lead Location: 753860
Implantable Lead Model: 7122
Implantable Pulse Generator Implant Date: 20200306
Lead Channel Impedance Value: 450 Ohm
Lead Channel Impedance Value: 475 Ohm
Lead Channel Impedance Value: 862.5 Ohm
Lead Channel Pacing Threshold Amplitude: 0.5 V
Lead Channel Pacing Threshold Amplitude: 0.625 V
Lead Channel Pacing Threshold Amplitude: 1.125 V
Lead Channel Pacing Threshold Pulse Width: 0.5 ms
Lead Channel Pacing Threshold Pulse Width: 0.5 ms
Lead Channel Pacing Threshold Pulse Width: 0.5 ms
Lead Channel Sensing Intrinsic Amplitude: 12 mV
Lead Channel Sensing Intrinsic Amplitude: 5 mV
Lead Channel Setting Pacing Amplitude: 2 V
Lead Channel Setting Pacing Amplitude: 2.125
Lead Channel Setting Pacing Pulse Width: 0.5 ms
Lead Channel Setting Pacing Pulse Width: 0.5 ms
MDC IDC SESS DTM: 20200316124706
MDC IDC SET LEADCHNL RA PACING AMPLITUDE: 3.5 V
MDC IDC SET LEADCHNL RV SENSING SENSITIVITY: 0.5 mV
MDC IDC STAT BRADY RA PERCENT PACED: 1.3 %
Pulse Gen Serial Number: 9878925

## 2018-11-04 NOTE — Progress Notes (Signed)
Wound check appointment. Steri-strips removed. Wound without redness. Diffuse edema noted, soft to palpation. Incision edges approximated, wound well healed. Pt aware to monitor for signs/symptoms of infection and call if any noted. Normal device function. Thresholds, sensing, and impedances consistent with implant measurements. Device programmed at 3.5V (RA only) with autocapture programmed on in RV and LV for extra safety margin until 3 month visit. Histogram distribution appropriate for patient and level of activity. BiV pacign >99%. No mode switches or ventricular arrhythmias noted. Patient educated about wound care, arm mobility, lifting restrictions, shock plan, and Merlin monitor. ROV with GT on 02/11/19.

## 2018-11-06 ENCOUNTER — Encounter: Payer: Self-pay | Admitting: *Deleted

## 2018-12-10 DIAGNOSIS — M25562 Pain in left knee: Secondary | ICD-10-CM | POA: Insufficient documentation

## 2018-12-25 ENCOUNTER — Other Ambulatory Visit: Payer: Self-pay

## 2018-12-25 ENCOUNTER — Other Ambulatory Visit: Payer: Self-pay | Admitting: Neurology

## 2018-12-27 ENCOUNTER — Encounter: Payer: Self-pay | Admitting: Internal Medicine

## 2018-12-27 ENCOUNTER — Other Ambulatory Visit: Payer: Self-pay

## 2018-12-27 ENCOUNTER — Ambulatory Visit: Payer: Medicaid Other | Admitting: Internal Medicine

## 2018-12-27 VITALS — BP 148/80 | HR 82 | Temp 98.6°F | Ht 65.0 in | Wt 312.0 lb

## 2018-12-27 DIAGNOSIS — E041 Nontoxic single thyroid nodule: Secondary | ICD-10-CM | POA: Diagnosis not present

## 2018-12-27 DIAGNOSIS — E063 Autoimmune thyroiditis: Secondary | ICD-10-CM

## 2018-12-27 LAB — T4, FREE: Free T4: 0.78 ng/dL (ref 0.60–1.60)

## 2018-12-27 LAB — T3, FREE: T3, Free: 3.2 pg/mL (ref 2.3–4.2)

## 2018-12-27 LAB — TSH: TSH: 3.54 u[IU]/mL (ref 0.35–4.50)

## 2018-12-27 NOTE — Progress Notes (Signed)
Patient ID: Sarah English, female   DOB: 1976-07-18, 43 y.o.   MRN: [id number]   HPI  Sarah English is a 43 y.o.-year-old female, returning for f/u for Hashimoto's thyroiditis, thyroid nodule.  Last visit a year ago.  In 2019, she was diagnosed with LBBB + NICMP (EF reduced), also pulmonary HTN per review of the results of her recent catheterization.  On Entresto and Spironolactone. She had a pacemaker and a defibrillator placed. She had cardiac rehab.  She also had a meniscus tear in the left leg so she is now mostly confined to the couch.  She initially lost weight but then she gained some weight back.  Since last visit, she had D&C for endometriosis.  Reviewed history Pt. has been dx with thyroiditis based on the aspect of the thyroid at the time of a recent thyroid U/S: heterogenous aspect, c/w thyroiditis.   Afterwards, antithyroid antibodies also returned elevated.  Reviewed patient's TFTs: Lab Results  Component Value Date   TSH 3.51 01/28/2018   TSH 0.21 (L) 03/30/2015   TSH 5.423 (H) 07/30/2014   TSH 0.39 03/26/2014   TSH 0.30 (L) 02/10/2014   FREET4 0.73 01/28/2018   FREET4 1.02 03/30/2015   FREET4 0.68 (L) 07/30/2014   FREET4 0.46 (L) 03/26/2014   FREET4 0.71 02/10/2014   TPO antibodies were elevated: Component     Latest Ref Rng & Units 11/11/2013 01/28/2018  Thyroperoxidase Ab SerPl-aCnc     <9 IU/mL 74.0 (H) 26 (H)   In the past, she was taking Tri-iodine and Tyrosine (started 1.5 mo prior) but she was not using iodinated salt at home. I advised her to stop the supplements and to start using iodated salt.   As the TSH was slightly high in 07/2014, we started her on levothyroxine, but subsequent TSH is slightly suppressed.  She was lost for follow-up afterwards.  She has been now off levothyroxine for more than 3 years.   She started Selenium 200 mcg daily after last visit >> feels better on it.  Pt denies: - feeling nodules in neck - hoarseness - dysphagia -  choking - SOB with lying down However, she feels a little pressure when swallowing.  Reviewed her thyroid ultrasound report from 04/15/2013: Right thyroid lobe:  4.5 x 2.1 x 1.6 cm Left thyroid lobe:  4.5 x 1.6 x 2.1 cm Isthmus:  6 mm  Focal nodules:  Thyroid echotexture is diffusely heterogeneous. Thyroid morphology is diffusely lobular.  5 mm hyperechoic focus in the interpolar left lobe on image 24.  Otherwise, no measurable focal solid or cystic nodules seen.  Diffusely increased vascularity.  Lymphadenopathy:  None visualized.  IMPRESSION: Lobular thyroid diffusely with heterogeneous echotexture and suggestion of increased vascularity.  Appearance is nonspecific and could be related to thyroiditis or its sequela.  No FH of thyroid cancer. No h/o radiation tx to head or neck.  No herbal supplements. No Biotin use. No recent steroids use.   She has a history of scleroderma, Sjogren's syndrome, fibromyalgia, vitamin D deficiency-all managed by her rheumatologist.   She had a MRI, then lumbar puncture performed for R leg weakness with falls >> no MS.  She is a single mom.  At last visit, she was telling me that she is eating relatively clean diet: organic, minimally processed, no artificial sweeteners, no sodas.  ROS: Constitutional: + weight gain after hurting her knee /no weight loss, + fatigue, no subjective hyperthermia, no subjective hypothermia Eyes: no blurry vision, no xerophthalmia ENT: no  sore throat, + see HPI Cardiovascular: no CP/no SOB/no palpitations/no leg swelling Respiratory: no cough/no SOB/no wheezing Gastrointestinal: no N/no V/no D/no C/no acid reflux Musculoskeletal: no muscle aches/+ joint aches Skin: no rashes, no hair loss Neurological: no tremors/no numbness/no tingling/no dizziness  I reviewed pt's medications, allergies, PMH, social hx, family hx, and changes were documented in the history of present illness. Otherwise, unchanged from my initial  visit note.  Past Medical History:  Diagnosis Date  . Anemia   . Autoimmune disorder (HCC)   . Bronchitis    hx -uses inhaler prn  . Chronic kidney disease    kidney infections  . Endometriosis   . Fibromyalgia   . GERD (gastroesophageal reflux disease)    occasional  . Hashimoto thyroiditis, fibrous variant   . Hashimoto's disease   . Headache    migraines  . History of kidney stones   . Hypertension   . IVCD (intraventricular conduction defect)   . LBBB (left bundle branch block)   . Nonischemic cardiomyopathy (HCC)    per stress test 12-17-2017  ef 14%;  per echo 12-13-2017 ef 21%  . Peripheral vascular disease (HCC)    raynoids syndrome  . Pneumonia    only x 1 - 2- 3 months ago  . 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 Scleroderma (HCC)   . Sjogren's syndrome (HCC)   . Sleep apnea    recently dx 04/13/18, does not have CPAP machine yet.  . Systemic sclerosis (HCC)   . Systolic CHF with reduced left ventricular function, NYHA class 3 [facility])    cardiologist-  dr Evlyn Clines patwardhan  . Tendinitis    lower back  . Thyroiditis    Past Surgical History:  Procedure Laterality Date  . BIV ICD INSERTION CRT-D N/A 10/25/2018   Procedure: BIV ICD INSERTION CRT-D;  Surgeon: Marinus Maw, MD;  Location: [facility] INVASIVE CV LAB;  Service: Cardiovascular;  Laterality: N/A;  . CESAREAN SECTION  2004   x 1  . CHOLECYSTECTOMY    . COLONOSCOPY    . CYSTOSCOPY     x 2 - bladder as a child  . HYSTEROSCOPY W/D&C N/A 04/16/2018   Procedure: DILATATION AND CURETTAGE /HYSTEROSCOPY;  Surgeon: Lavina Hamman, MD;  Location: [facility] ORS;  Service: Gynecology;  Laterality: N/A;  . LAPAROSCOPIC LYSIS OF ADHESIONS  04/16/2018   Procedure: LAPAROSCOPIC LYSIS OF ADHESIONS;  Surgeon: Lavina Hamman, MD;  Location: [facility] ORS;  Service: Gynecology;;  . MANDIBLE FRACTURE SURGERY  1994   upper mandibular  . RIGHT/LEFT HEART CATH AND CORONARY ANGIOGRAPHY N/A  12/18/2017   Procedure: RIGHT/LEFT HEART CATH AND CORONARY ANGIOGRAPHY;  Surgeon: Elder Negus, MD;  Location: [facility] INVASIVE CV LAB;  Service: Cardiovascular;  Laterality: N/A;  . UPPER GI ENDOSCOPY    . WISDOM TOOTH EXTRACTION     Social History   Socioeconomic History  . Marital status: Single    Spouse name: Not on file  . Number of children: 1  . Years of education: 31  . Highest education level: Associate degree: occupational, Scientist, product/process development, or vocational program  Occupational History  . 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 Financial resource strain: Not on file  . Food insecurity:    Worry: Not on file    Inability: Not on file  . Transportation needs:    Medical: Not on file    Non-medical: Not on file  Tobacco Use  .  Smoking status: Former Smoker    Packs/day: 0.50    Years: 8.00    Pack years: 4.00    Types: Cigarettes    Last attempt to quit: 12/10/2012    Years since quitting: 6.0  . Smokeless tobacco: Never Used  Substance and Sexual Activity  . Alcohol use: Yes    Comment: occasional wine  . Drug use: No  . Sexual activity: Yes    Birth control/protection: Pill  Lifestyle  . Physical activity:    Days per week: Not on file    Minutes per session: Not on file  . Stress: Not on file  Relationships  . Social connections:    Talks on phone: Not on file    Gets together: Not on file    Attends religious service: Not on file    Active member of club or organization: Not on file    Attends meetings of clubs or organizations: Not on file    Relationship status: Not on file  . Intimate partner violence:    Fear of current or ex partner: Not on file    Emotionally abused: Not on file    Physically abused: Not on file    Forced sexual activity: Not on file  Other Topics Concern  . Not on file  Social History Narrative   Lives with son, renting a room from her mother.  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      Education: college.    Current Outpatient Medications on File Prior to Visit  Medication Sig Dispense Refill  . albuterol (PROVENTIL HFA;VENTOLIN HFA) 108 (90 Base) MCG/ACT inhaler Inhale into the lungs every 6 (six) hours as needed for wheezing or shortness of breath.    Marland Kitchen antiseptic oral rinse (BIOTENE) LIQD 1-2 application by Mouth Rinse route every hour as needed for dry mouth.    . Calcium-Magnesium-Zinc (CAL-MAG-ZINC PO) Take 1 tablet by mouth daily.    . clindamycin (CLEOCIN) 300 MG capsule Take 1 capsule by mouth 3 (three) times daily. For 10 days    . CORLANOR 5 MG TABS tablet TAKE 1 TABLET BY MOUTH TWICE A DAY (Patient taking differently: Take 5 mg by mouth 2 (two) times daily. ) 60 tablet 5  . cyclobenzaprine (FLEXERIL) 10 MG tablet Take 10 mg by mouth 3 (three) times daily as needed for muscle spasms.    . cycloSPORINE (RESTASIS) 0.05 % ophthalmic emulsion Place 1 drop into both eyes 2 (two) times daily.     Marland Kitchen etonogestrel-ethinyl estradiol (NUVARING) 0.12-0.015 MG/24HR vaginal ring Place 1 each vaginally every 28 (twenty-eight) days. Insert vaginally and leave in place for 4 consecutive weeks    . furosemide (LASIX) 20 MG tablet Take 20 mg by mouth daily.     Marland Kitchen gabapentin (NEURONTIN) 300 MG capsule TAKE 1 CAPSULE BY MOUTH THREE TIMES A DAY 90 capsule 0  . HYDROcodone-acetaminophen (NORCO) 5-325 MG tablet Take 1 tablet by mouth every 6 (six) hours as needed for severe pain. 30 tablet 0  . Lactobacillus Rhamnosus, GG, (CULTURELLE PROBIOTICS KIDS PO) Take 1 tablet by mouth daily.    . metoprolol succinate (TOPROL-XL) 50 MG 24 hr tablet Take 1 tablet (50 mg total) by mouth daily. 90 tablet 3  . omeprazole (PRILOSEC) 20 MG capsule Take 20 mg by mouth daily.    Marland Kitchen PAZEO 0.7 % SOLN Place 1 drop into both eyes daily.    . sacubitril-valsartan (ENTRESTO) 97-103 MG  Take 1 tablet by mouth 2 (two) times daily.     . saline (AYR) GEL Place 1 application into the nose daily.    Marland Kitchen. selenium  (SELENIMIN-200) 200 MCG TABS tablet Take 200 mcg by mouth daily.     . silver sulfADIAZINE (SILVADENE) 1 % cream Apply 1 application topically 2 (two) times daily as needed (wounds/affected area(s) of skin.).     Marland Kitchen. spironolactone (ALDACTONE) 25 MG tablet Take 12.5 mg by mouth daily.    . SUMAtriptan (IMITREX) 100 MG tablet Take 100 mg by mouth every 6 (six) hours as needed for migraine. May repeat in 2 hours if headache persists or recurs.    . traMADol (ULTRAM) 50 MG tablet Take 1 tablet (50 mg total) by mouth every 6 (six) hours as needed. 8 tablet 0   No current facility-administered medications on file prior to visit.    Allergies  Allergen Reactions  . Sulfa Antibiotics Nausea And Vomiting   Family History  Problem Relation Age of Onset  . Diabetes Mother        Type II  . Heart disease Mother        stent surgery  . Hypertension Mother   . Hyperlipidemia Mother   . Diabetes Father        Type II  . Hypertension Father   . Hyperlipidemia Father    PE: BP (!) 148/80   Pulse 82   Temp 98.6 F (37 C)   Ht 5\' 5"  (1.651 m) Comment: measured  Wt (!) 312 lb (141.5 kg)   SpO2 98%   BMI 51.92 kg/m  Body mass index is 51.92 kg/m. Wt Readings from Last 3 Encounters:  12/27/18 (!) 312 lb (141.5 kg)  10/26/18 (!) 305 lb (138.3 kg)  10/17/18 (!) 309 lb (140.2 kg)   Constitutional: overweight, in NAD Eyes: PERRLA, EOMI, no exophthalmos ENT: moist mucous membranes, no thyromegaly, no cervical lymphadenopathy Cardiovascular: RRR, No MRG Respiratory: CTA B Gastrointestinal: abdomen soft, NT, ND, BS+ Musculoskeletal: no deformities, strength intact in all 4 Skin: moist, warm, no rashes Neurological: no tremor with outstretched hands, DTR normal in all 4  ASSESSMENT: 1. Hashimoto's thyroiditis  2.  Thyroid nodule  3.  Neck pressure  PLAN:  1. Patient with history of Hashimoto's thyroiditis, with mildly elevated TSH the past, after which we started a low-dose levothyroxine,  25 mcg daily.  However, after we started this, her TSH was slightly low so we stopped levothyroxine.  She has been off the medication for more than 3 years.  At last visit, TFTs were normal and her TPO antibodies were slightly elevated. -She is asymptomatic, without hypothyroid symptoms -We again discussed about her Hashimoto's thyroiditis which appears to still be active, but her antibodies were lower than before.  This is a good sign. -We will recheck her TFTs today and see if we need to restart a low-dose levothyroxine -She is aware about correct intake of levothyroxine if we need to restart every day, with water, separated by at least 30 minutes from breakfast, and more than 4 hours from acid reflux medications, calcium, iron, multivitamins -If her labs are normal, I will see her back in a year  2.  Thyroid nodule -At last visit, patient was telling me that she occasionally felt a lump in her neck we discussed that this is possibly due to inflammation and increased blood flow due to Hashimoto's thyroiditis -Previous thyroid ultrasound from 2014 showed a 5 mm thyroid nodule, not worrisome.  No further investigation is needed for this.  3.  Neck pressure -At this visit, she describes a little neck pressure in the last few months.  She has to be careful when she swallows pills. -I reviewed the results of her thyroid ultrasound and explained that the thyroid nodule is very small and unlikely to cause neck compression symptoms.  However, she does have signs of thyroid inflammation and we will check her antibodies at last visit to see if they have decreased.  We started selenium at last visit and she feels better after she started it.  We will continue it at 200 mcg daily. -Discussed that she can take an occasional ibuprofen if the pressure in neck is very bothersome -I do not feel that she needs another thyroid ultrasound now  Office Visit on 12/27/2018  Component Date Value Ref Range Status  . TSH  12/27/2018 3.54  0.35 - 4.50 uIU/mL Final  . Free T4 12/27/2018 0.78  0.60 - 1.60 ng/dL Final   Comment: Specimens from patients who are undergoing biotin therapy and /or ingesting biotin supplements may contain high levels of biotin.  The higher biotin concentration in these specimens interferes with this Free T4 assay.  Specimens that contain high levels  of biotin may cause false high results for this Free T4 assay.  Please interpret results in light of the total clinical presentation of the patient.    . T3, Free 12/27/2018 3.2  2.3 - 4.2 pg/mL Final   Thyroid test are normal.  No need to start levothyroxine.  TPO antibodies are pending.  Carlus Pavlov, MD PhD [facility]

## 2018-12-27 NOTE — Patient Instructions (Addendum)
Please stop at the lab.  Continue Selenium 200 mcg daily.  Please come back for a follow-up appointment in 1 year.  

## 2018-12-30 ENCOUNTER — Other Ambulatory Visit: Payer: Self-pay | Admitting: Cardiology

## 2018-12-30 DIAGNOSIS — I5022 Chronic systolic (congestive) heart failure: Secondary | ICD-10-CM

## 2018-12-30 LAB — THYROID PEROXIDASE ANTIBODY: Thyroperoxidase Ab SerPl-aCnc: 22 IU/mL — ABNORMAL HIGH (ref <9)

## 2018-12-30 MED ORDER — IVABRADINE HCL 5 MG PO TABS: 5.0000 mg | ORAL_TABLET | Freq: Two times a day (BID) | ORAL | 1 refills | Status: DC

## 2019-01-24 ENCOUNTER — Other Ambulatory Visit (HOSPITAL_COMMUNITY): Payer: Self-pay | Admitting: Orthopedic Surgery

## 2019-01-24 DIAGNOSIS — M25562 Pain in left knee: Secondary | ICD-10-CM

## 2019-02-02 ENCOUNTER — Other Ambulatory Visit: Payer: Self-pay | Admitting: Neurology

## 2019-02-07 ENCOUNTER — Ambulatory Visit (INDEPENDENT_AMBULATORY_CARE_PROVIDER_SITE_OTHER): Payer: Medicaid Other | Admitting: *Deleted

## 2019-02-07 DIAGNOSIS — I5022 Chronic systolic (congestive) heart failure: Secondary | ICD-10-CM

## 2019-02-07 LAB — CUP PACEART REMOTE DEVICE CHECK
Battery Remaining Longevity: 77 mo
Battery Remaining Percentage: 95 %
Battery Voltage: 3.17 V
Brady Statistic AP VP Percent: 3.9 %
Brady Statistic AP VS Percent: 1 %
Brady Statistic AS VP Percent: 95 %
Brady Statistic AS VS Percent: 1 %
Brady Statistic RA Percent Paced: 4.5 %
Date Time Interrogation Session: 20200619060016
HighPow Impedance: 69 Ohm
HighPow Impedance: 69 Ohm
Implantable Lead Implant Date: 20200306
Implantable Lead Implant Date: 20200306
Implantable Lead Implant Date: 20200306
Implantable Lead Location: 753858
Implantable Lead Location: 753859
Implantable Lead Location: 753860
Implantable Lead Model: 7122
Implantable Pulse Generator Implant Date: 20200306
Lead Channel Impedance Value: 1125 Ohm
Lead Channel Impedance Value: 400 Ohm
Lead Channel Impedance Value: 530 Ohm
Lead Channel Pacing Threshold Amplitude: 0.5 V
Lead Channel Pacing Threshold Amplitude: 0.5 V
Lead Channel Pacing Threshold Amplitude: 1 V
Lead Channel Pacing Threshold Pulse Width: 0.5 ms
Lead Channel Pacing Threshold Pulse Width: 0.5 ms
Lead Channel Pacing Threshold Pulse Width: 0.5 ms
Lead Channel Sensing Intrinsic Amplitude: 12 mV
Lead Channel Sensing Intrinsic Amplitude: 5 mV
Lead Channel Setting Pacing Amplitude: 2 V
Lead Channel Setting Pacing Amplitude: 2 V
Lead Channel Setting Pacing Amplitude: 3.5 V
Lead Channel Setting Pacing Pulse Width: 0.5 ms
Lead Channel Setting Pacing Pulse Width: 0.5 ms
Lead Channel Setting Sensing Sensitivity: 0.5 mV
Pulse Gen Serial Number: 9878925

## 2019-02-11 ENCOUNTER — Other Ambulatory Visit: Payer: Self-pay | Admitting: Orthopedic Surgery

## 2019-02-11 ENCOUNTER — Encounter: Payer: Medicaid Other | Admitting: Internal Medicine

## 2019-02-11 DIAGNOSIS — M25562 Pain in left knee: Secondary | ICD-10-CM

## 2019-02-13 NOTE — Progress Notes (Signed)
Remote ICD transmission.   

## 2019-02-24 ENCOUNTER — Telehealth: Payer: Self-pay | Admitting: Internal Medicine

## 2019-02-24 ENCOUNTER — Encounter: Payer: Self-pay | Admitting: Internal Medicine

## 2019-02-24 ENCOUNTER — Ambulatory Visit (INDEPENDENT_AMBULATORY_CARE_PROVIDER_SITE_OTHER): Payer: Medicaid Other | Admitting: Internal Medicine

## 2019-02-24 ENCOUNTER — Other Ambulatory Visit: Payer: Self-pay

## 2019-02-24 VITALS — BP 122/76 | HR 81 | Ht 65.0 in | Wt 311.0 lb

## 2019-02-24 DIAGNOSIS — Z95810 Presence of automatic (implantable) cardiac defibrillator: Secondary | ICD-10-CM | POA: Diagnosis not present

## 2019-02-24 DIAGNOSIS — I5022 Chronic systolic (congestive) heart failure: Secondary | ICD-10-CM

## 2019-02-24 LAB — CUP PACEART INCLINIC DEVICE CHECK
Battery Remaining Longevity: 88 mo
Brady Statistic RA Percent Paced: 4.6 %
Brady Statistic RV Percent Paced: 99.08 %
Date Time Interrogation Session: 20200706162559
HighPow Impedance: 69.75 Ohm
Implantable Lead Implant Date: 20200306
Implantable Lead Implant Date: 20200306
Implantable Lead Implant Date: 20200306
Implantable Lead Location: 753858
Implantable Lead Location: 753859
Implantable Lead Location: 753860
Implantable Lead Model: 7122
Implantable Pulse Generator Implant Date: 20200306
Lead Channel Impedance Value: 1187.5 Ohm
Lead Channel Impedance Value: 425 Ohm
Lead Channel Impedance Value: 562.5 Ohm
Lead Channel Pacing Threshold Amplitude: 0.5 V
Lead Channel Pacing Threshold Amplitude: 0.625 V
Lead Channel Pacing Threshold Amplitude: 0.75 V
Lead Channel Pacing Threshold Amplitude: 1.25 V
Lead Channel Pacing Threshold Pulse Width: 0.5 ms
Lead Channel Pacing Threshold Pulse Width: 0.5 ms
Lead Channel Pacing Threshold Pulse Width: 0.5 ms
Lead Channel Pacing Threshold Pulse Width: 0.5 ms
Lead Channel Sensing Intrinsic Amplitude: 12 mV
Lead Channel Sensing Intrinsic Amplitude: 5 mV
Lead Channel Setting Pacing Amplitude: 1.625
Lead Channel Setting Pacing Amplitude: 2 V
Lead Channel Setting Pacing Amplitude: 2.25 V
Lead Channel Setting Pacing Pulse Width: 0.5 ms
Lead Channel Setting Pacing Pulse Width: 0.5 ms
Lead Channel Setting Sensing Sensitivity: 0.5 mV
Pulse Gen Serial Number: 9878925

## 2019-02-24 NOTE — Patient Instructions (Addendum)
Medication Instructions:  Your physician recommends that you continue on your current medications as directed. Please refer to the Current Medication list given to you today.  Labwork: None ordered.  Testing/Procedures: None ordered.  Follow-Up: Your physician wants you to follow-up in: 9 months with Dr. Lovena Le.   You will receive a reminder letter in the mail two months in advance. If you don't receive a letter, please call our office to schedule the follow-up appointment.  Remote monitoring is used to monitor your ICD from home. This monitoring reduces the number of office visits required to check your device to one time per year. It allows [place] to keep an eye on the functioning of your device to ensure it is working properly. You are scheduled for a device check from home on 05/12/2019. You may send your transmission at any time that day. If you have a wireless device, the transmission will be sent automatically. After your physician reviews your transmission, you will receive a postcard with your next transmission date.  Any Other Special Instructions Will Be Listed Below (If Applicable).  If you need a refill on your cardiac medications before your next appointment, please call your pharmacy.

## 2019-02-24 NOTE — Telephone Encounter (Signed)

## 2019-02-24 NOTE — Progress Notes (Signed)
HPI [name] returns today for followup. She is a pleasant 43 yo woman with a h/o chronic systolic heart failure, s/p BIV ICD insertion. She has severe LV dysfunction and Sjogren's syndrome. She underwent biv icd insertion in March.  In the interim, her CHF symptoms have gone from class 3 to class 2. She has some arthritic limitations. She still struggle going up hills but does well on inclines. No ICD shocks. Allergies  Allergen Reactions  . Sulfa Antibiotics Nausea And Vomiting     Current Outpatient Medications  Medication Sig Dispense Refill  . albuterol (PROVENTIL HFA;VENTOLIN HFA) 108 (90 Base) MCG/ACT inhaler Inhale into the lungs every 6 (six) hours as needed for wheezing or shortness of breath.    Marland Kitchen. antiseptic oral rinse (BIOTENE) LIQD 1-2 application by Mouth Rinse route every hour as needed for dry mouth.    . cyclobenzaprine (FLEXERIL) 10 MG tablet Take 10 mg by mouth 3 (three) times daily as needed for muscle spasms.    . cycloSPORINE (RESTASIS) 0.05 % ophthalmic emulsion Place 1 drop into both eyes 2 (two) times daily.     Marland Kitchen. etonogestrel-ethinyl estradiol (NUVARING) 0.12-0.015 MG/24HR vaginal ring Place 1 each vaginally every 28 (twenty-eight) days. Insert vaginally and leave in place for 4 consecutive weeks    . furosemide (LASIX) 20 MG tablet Take 20 mg by mouth daily.     Marland Kitchen. gabapentin (NEURONTIN) 300 MG capsule TAKE 1 CAPSULE BY MOUTH THREE TIMES A DAY 90 capsule 0  . ivabradine (CORLANOR) 5 MG TABS tablet Take 1 tablet (5 mg total) by mouth 2 (two) times daily. 60 tablet 1  . Lactobacillus Rhamnosus, GG, (CULTURELLE PROBIOTICS KIDS PO) Take 1 tablet by mouth daily.    . Magnesium 200 MG TABS Take 1 tablet by mouth daily.    . metoprolol succinate (TOPROL-XL) 50 MG 24 hr tablet Take 1 tablet (50 mg total) by mouth daily. 90 tablet 3  . Multiple Vitamin (MULTIVITAMIN) tablet Take 1 tablet by mouth daily.    . pantoprazole (PROTONIX) 40 MG tablet Take 40 mg by mouth  daily.    Marland Kitchen. PAZEO 0.7 % SOLN Place 1 drop into both eyes daily.    . sacubitril-valsartan (ENTRESTO) 97-103 MG Take 1 tablet by mouth 2 (two) times daily.     . saline (AYR) GEL Place 1 application into the nose daily.    Marland Kitchen. selenium (SELENIMIN-200) 200 MCG TABS tablet Take 200 mcg by mouth daily.     . silver sulfADIAZINE (SILVADENE) 1 % cream Apply 1 application topically 2 (two) times daily as needed (wounds/affected area(s) of skin.).     Marland Kitchen. spironolactone (ALDACTONE) 25 MG tablet Take 25 mg by mouth daily.     . SUMAtriptan (IMITREX) 100 MG tablet Take 100 mg by mouth every 6 (six) hours as needed for migraine. May repeat in 2 hours if headache persists or recurs.    . traMADol (ULTRAM) 50 MG tablet Take 1 tablet (50 mg total) by mouth every 6 (six) hours as needed. 8 tablet 0   No current facility-administered medications for this visit.      Past Medical History:  Diagnosis Date  . Anemia   . Autoimmune disorder (HCC)   . Bronchitis    hx -uses inhaler prn  . Chronic kidney disease    kidney infections  . Endometriosis   . Fibromyalgia   . GERD (gastroesophageal reflux disease)    occasional  . Hashimoto thyroiditis, fibrous  variant   . Hashimoto's disease   . Headache    migraines  . History of kidney stones   . Hypertension   . IVCD (intraventricular conduction defect)   . LBBB (left bundle branch block)   . Nonischemic cardiomyopathy (HCC)    per stress test 12-17-2017  ef 14%;  per echo 12-13-2017 ef 21%  . Peripheral vascular disease (HCC)    raynoids syndrome  . Pneumonia    only x 1 - 2- 3 months ago  . 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 Scleroderma (HCC)   . Sjogren's syndrome (HCC)   . Sleep apnea    recently dx 04/13/18, does not have CPAP machine yet.  . Systemic sclerosis (HCC)   . Systolic CHF with reduced left ventricular function, NYHA class 3 [facility])    cardiologist-  dr Evlyn Clines patwardhan  .  Tendinitis    lower back  . Thyroiditis     ROS:   All systems reviewed and negative except as noted in the HPI.   Past Surgical History:  Procedure Laterality Date  . BIV ICD INSERTION CRT-D N/A 10/25/2018   Procedure: BIV ICD INSERTION CRT-D;  Surgeon: Marinus Maw, MD;  Location: [facility] INVASIVE CV LAB;  Service: Cardiovascular;  Laterality: N/A;  . CESAREAN SECTION  2004   x 1  . CHOLECYSTECTOMY    . COLONOSCOPY    . CYSTOSCOPY     x 2 - bladder as a child  . HYSTEROSCOPY W/D&C N/A 04/16/2018   Procedure: DILATATION AND CURETTAGE /HYSTEROSCOPY;  Surgeon: Lavina Hamman, MD;  Location: [facility] ORS;  Service: Gynecology;  Laterality: N/A;  . LAPAROSCOPIC LYSIS OF ADHESIONS  04/16/2018   Procedure: LAPAROSCOPIC LYSIS OF ADHESIONS;  Surgeon: Lavina Hamman, MD;  Location: [facility] ORS;  Service: Gynecology;;  . MANDIBLE FRACTURE SURGERY  1994   upper mandibular  . RIGHT/LEFT HEART CATH AND CORONARY ANGIOGRAPHY N/A 12/18/2017   Procedure: RIGHT/LEFT HEART CATH AND CORONARY ANGIOGRAPHY;  Surgeon: Elder Negus, MD;  Location: [facility] INVASIVE CV LAB;  Service: Cardiovascular;  Laterality: N/A;  . UPPER GI ENDOSCOPY    . WISDOM TOOTH EXTRACTION       Family History  Problem Relation Age of Onset  . Diabetes Mother        Type II  . Heart disease Mother        stent surgery  . Hypertension Mother   . Hyperlipidemia Mother   . Diabetes Father        Type II  . Hypertension Father   . Hyperlipidemia Father      Social History   Socioeconomic History  . Marital status: Single    Spouse name: Not on file  . Number of children: 1  . Years of education: 54  . Highest education level: Associate degree: occupational, Scientist, product/process development, or vocational program  Occupational History  . 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 Financial resource strain: Not on file  . Food insecurity    Worry: Not on file    Inability: Not on file  . Transportation needs    Medical: Not on file     Non-medical: Not on file  Tobacco Use  . Smoking status: Former Smoker    Packs/day: 0.50    Years: 8.00    Pack years: 4.00    Types: Cigarettes    Quit date: 12/10/2012    Years since quitting: 6.2  .  Smokeless tobacco: Never Used  Substance and Sexual Activity  . Alcohol use: Yes    Comment: occasional wine  . Drug use: No  . Sexual activity: Yes    Birth control/protection: Pill  Lifestyle  . Physical activity    Days per week: Not on file    Minutes per session: Not on file  . Stress: Not on file  Relationships  . Social Herbalist on phone: Not on file    Gets together: Not on file    Attends religious service: Not on file    Active member of club or organization: Not on file    Attends meetings of clubs or organizations: Not on file    Relationship status: Not on file  . Intimate partner violence    Fear of current or ex partner: Not on file    Emotionally abused: Not on file    Physically abused: Not on file    Forced sexual activity: Not on file  Other Topics Concern  . Not on file  Social History Narrative   Lives with son, renting a room from her mother.  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     Education: college.      BP 122/76   Pulse 81   Ht 5\' 5"  (1.651 m)   Wt (!) 311 lb (141.1 kg)   SpO2 97%   BMI 51.75 kg/m   Physical Exam:  Well appearing NAD HEENT: Unremarkable Neck:  No JVD, no thyromegally Lymphatics:  No adenopathy Back:  No CVA tenderness Lungs:  Clear with no wheezes HEART:  Regular rate rhythm, no murmurs, no rubs, no clicks Abd:  soft, positive bowel sounds, no organomegally, no rebound, no guarding Ext:  2 plus pulses, no edema, no cyanosis, no clubbing Skin:  No rashes no nodules Neuro:  CN II through XII intact, motor grossly intact  EKG - nsr with biv pacing  DEVICE  Normal device function.  See PaceArt for details.   Assess/Plan: 1. Chronic systolic heart  failure - her symptoms are class 2. She will continue her current meds. I encouraged her to lose weight. 2. ICD - her St. Jude biv ICD is working normally and her outputs were turned down to maximize battery longevity. 3. Obesity - as above, I encouraged her to lose weight.  Mikle Bosworth.D.

## 2019-02-26 ENCOUNTER — Ambulatory Visit
Admission: RE | Admit: 2019-02-26 | Discharge: 2019-02-26 | Disposition: A | Discharge status: Home / Self Care | Payer: Medicaid Other | Source: Ambulatory Visit | Attending: Orthopedic Surgery | Admitting: Orthopedic Surgery

## 2019-02-26 ENCOUNTER — Other Ambulatory Visit: Payer: Self-pay

## 2019-02-26 DIAGNOSIS — M25562 Pain in left knee: Secondary | ICD-10-CM

## 2019-03-04 ENCOUNTER — Other Ambulatory Visit: Payer: Self-pay | Admitting: Cardiology

## 2019-03-04 DIAGNOSIS — I5022 Chronic systolic (congestive) heart failure: Secondary | ICD-10-CM

## 2019-03-04 NOTE — Telephone Encounter (Signed)
Please fill if necessary

## 2019-03-05 ENCOUNTER — Other Ambulatory Visit: Payer: Self-pay | Admitting: Cardiology

## 2019-03-05 DIAGNOSIS — I5022 Chronic systolic (congestive) heart failure: Secondary | ICD-10-CM

## 2019-03-07 ENCOUNTER — Other Ambulatory Visit: Payer: Self-pay | Admitting: Neurology

## 2019-03-31 ENCOUNTER — Other Ambulatory Visit: Payer: Self-pay | Admitting: Cardiology

## 2019-03-31 DIAGNOSIS — I5022 Chronic systolic (congestive) heart failure: Secondary | ICD-10-CM

## 2019-03-31 NOTE — Telephone Encounter (Signed)
Please fill if necessary

## 2019-04-03 ENCOUNTER — Encounter: Payer: Self-pay | Admitting: Cardiology

## 2019-04-04 ENCOUNTER — Ambulatory Visit: Payer: Medicaid Other | Admitting: Cardiology

## 2019-04-15 ENCOUNTER — Other Ambulatory Visit: Payer: Self-pay | Admitting: Gastroenterology

## 2019-04-15 DIAGNOSIS — R1012 Left upper quadrant pain: Secondary | ICD-10-CM

## 2019-04-15 DIAGNOSIS — R1031 Right lower quadrant pain: Secondary | ICD-10-CM

## 2019-04-16 ENCOUNTER — Other Ambulatory Visit: Payer: Self-pay

## 2019-04-16 ENCOUNTER — Telehealth: Payer: Medicaid Other | Admitting: Cardiology

## 2019-04-16 VITALS — BP 135/66 | HR 66 | Ht 66.0 in | Wt 319.0 lb

## 2019-04-16 DIAGNOSIS — Z95810 Presence of automatic (implantable) cardiac defibrillator: Secondary | ICD-10-CM

## 2019-04-16 DIAGNOSIS — I10 Essential (primary) hypertension: Secondary | ICD-10-CM | POA: Diagnosis not present

## 2019-04-16 DIAGNOSIS — I5022 Chronic systolic (congestive) heart failure: Secondary | ICD-10-CM | POA: Diagnosis not present

## 2019-04-16 MED ORDER — IVABRADINE HCL 7.5 MG PO TABS: 7.5000 mg | ORAL_TABLET | Freq: Two times a day (BID) | ORAL | 3 refills | Status: DC

## 2019-04-16 MED ORDER — SPIRONOLACTONE 50 MG PO TABS: 50.0000 mg | ORAL_TABLET | Freq: Every day | ORAL | 3 refills | Status: DC

## 2019-04-16 NOTE — Progress Notes (Addendum)
Patient is here for follow up visit.  Subjective:   @Patient  ID: Sarah English, female    DOB: 1976/04/27, 43 y.o.   MRN: [id number]  Chief Complaint  Patient presents with  . Cardiomyopathy    HPI   43 y/o Caucasian female with  nonischemic dilated cardiomyopathy, now s/p BiVICD placement, Hashimoto thyroiditis, systemic sclerosis, Sjogren's disease, Raynaud's syndrome, morbid obesity, endometriosis, menorrhagia.  She underwent St Jude BiV-ICD placement for cardiac resynchronization therapy by Dr. Cristopher Peru on 10/25/2018. She has had some improvement in her breathing, but still has to stop after barely a quarter of a mile, due to knee pain and shortness of breath. She has gained 8 lbs since last visit, primarily due to decreased activity owing to knee pain. She denies overt leg swelling, orthopnea, PND.   Past Medical History:  Diagnosis Date  . Anemia   . Autoimmune disorder ([place])   . Bronchitis    hx -uses inhaler prn  . Chronic kidney disease    kidney infections  . Endometriosis   . Fibromyalgia   . GERD (gastroesophageal reflux disease)    occasional  . Hashimoto thyroiditis, fibrous variant   . Hashimoto's disease   . Headache    migraines  . History of kidney stones   . Hypertension   . IVCD (intraventricular conduction defect)   . LBBB (left bundle branch block)   . Nonischemic cardiomyopathy ([place])    per stress test 12-17-2017  ef 14%;  per echo 12-13-2017 ef 21%  . Peripheral vascular disease ([place])    raynoids syndrome  . Pneumonia    only x 1 - 2- 3 months ago  . Pulmonary hypertension ([place])    per cardiac cath 12-18-2017--- moderate WHO Grp II    . Scleroderma ([place])   . Sjogren's syndrome ([place])   . Sleep apnea    recently dx 04/13/18, does not have CPAP machine yet.  . Systemic sclerosis ([place])   . Systolic CHF with reduced left ventricular function, NYHA class 3 [facility])    cardiologist-  dr Joya Gaskins Reianna [place]dorf  . Tendinitis    lower back  .  Thyroiditis     Past Surgical History:  Procedure Laterality Date  . BIV ICD INSERTION CRT-D N/A 10/25/2018   Procedure: BIV ICD INSERTION CRT-D;  Surgeon: Evans Lance, MD;  Location: [place] CV LAB;  Service: Cardiovascular;  Laterality: N/A;  . CESAREAN SECTION  2004   x 1  . CHOLECYSTECTOMY    . COLONOSCOPY    . CYSTOSCOPY     x 2 - bladder as a child  . HYSTEROSCOPY W/D&C N/A 04/16/2018   Procedure: DILATATION AND CURETTAGE /HYSTEROSCOPY;  Surgeon: Cheri Fowler, MD;  Location: [place] ORS;  Service: Gynecology;  Laterality: N/A;  . LAPAROSCOPIC LYSIS OF ADHESIONS  04/16/2018   Procedure: LAPAROSCOPIC LYSIS OF ADHESIONS;  Surgeon: Cheri Fowler, MD;  Location: [place] ORS;  Service: Gynecology;;  . MANDIBLE FRACTURE SURGERY  1994   upper mandibular  . RIGHT/LEFT HEART CATH AND CORONARY ANGIOGRAPHY N/A 12/18/2017   Procedure: RIGHT/LEFT HEART CATH AND CORONARY ANGIOGRAPHY;  Surgeon: Nigel Mormon, MD;  Location: [place] CV LAB;  Service: Cardiovascular;  Laterality: N/A;  . UPPER GI ENDOSCOPY    . WISDOM TOOTH EXTRACTION      Social History   Socioeconomic History  . Marital status: Single    Spouse name: Not on file  . Number of children: 1  . Years of education: 47  .  Highest education level: Associate degree: occupational, Scientist, product/process developmenttechnical, or vocational program  Occupational History  . Occupation: not working  Engineer, productionocial Needs  . Financial resource strain: Not on file  . Food insecurity    Worry: Not on file    Inability: Not on file  . Transportation needs    Medical: Not on file    Non-medical: Not on file  Tobacco Use  . Smoking status: Former Smoker    Packs/day: 0.50    Years: 8.00    Pack years: 4.00    Types: Cigarettes    Quit date: 12/10/2012    Years since quitting: 6.3  . Smokeless tobacco: Never Used  Substance and Sexual Activity  . Alcohol use: Yes    Comment: once a year  . Drug use: No  . Sexual activity: Yes    Birth control/protection:  Pill  Lifestyle  . Physical activity    Days per week: Not on file    Minutes per session: Not on file  . Stress: Not on file  Relationships  . Social Musicianconnections    Talks on phone: Not on file    Gets together: Not on file    Attends religious service: Not on file    Active member of club or organization: Not on file    Attends meetings of clubs or organizations: Not on file    Relationship status: Not on file  . Intimate partner violence    Fear of current or ex partner: Not on file    Emotionally abused: Not on file    Physically abused: Not on file    Forced sexual activity: Not on file  Other Topics Concern  . Not on file  Social History Narrative   Lives with son, renting a room from her mother.  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     Education: college.     Current Outpatient Medications on File Prior to Visit  Medication Sig Dispense Refill  . albuterol (PROVENTIL HFA;VENTOLIN HFA) 108 (90 Base) MCG/ACT inhaler Inhale into the lungs every 6 (six) hours as needed for wheezing or shortness of breath.    Marland Kitchen. antiseptic oral rinse (BIOTENE) LIQD 1-2 application by Mouth Rinse route every hour as needed for dry mouth.    Retia Passe. CORLANOR 5 MG TABS tablet TAKE 1 TABLET BY MOUTH TWICE A DAY 56 tablet 1  . cyclobenzaprine (FLEXERIL) 10 MG tablet Take 10 mg by mouth 3 (three) times daily as needed for muscle spasms.    . cycloSPORINE (RESTASIS) 0.05 % ophthalmic emulsion Place 1 drop into both eyes 2 (two) times daily.     Marland Kitchen. etonogestrel-ethinyl estradiol (NUVARING) 0.12-0.015 MG/24HR vaginal ring Place 1 each vaginally every 28 (twenty-eight) days. Insert vaginally and leave in place for 4 consecutive weeks    . furosemide (LASIX) 20 MG tablet Take 20 mg by mouth as needed.     . gabapentin (NEURONTIN) 300 MG capsule TAKE 1 CAPSULE BY MOUTH THREE TIMES A DAY 90 capsule 0  . Lactobacillus Rhamnosus, GG, (CULTURELLE PROBIOTICS KIDS PO) Take 1  tablet by mouth daily.    . Magnesium 200 MG TABS Take 1 tablet by mouth daily.    . metoprolol succinate (TOPROL-XL) 50 MG 24 hr tablet Take 1 tablet (50 mg total) by mouth daily. 90 tablet 3  . Multiple Vitamin (MULTIVITAMIN) tablet Take 1 tablet by mouth daily.    . pantoprazole (PROTONIX) 40 MG tablet  Take 40 mg by mouth daily.    Marland Kitchen PAZEO 0.7 % SOLN Place 1 drop into both eyes daily.    . sacubitril-valsartan (ENTRESTO) 97-103 MG Take 1 tablet by mouth 2 (two) times daily.     . saline (AYR) GEL Place 1 application into the nose daily.    Marland Kitchen selenium (SELENIMIN-200) 200 MCG TABS tablet Take 200 mcg by mouth daily.     . silver sulfADIAZINE (SILVADENE) 1 % cream Apply 1 application topically 2 (two) times daily as needed (wounds/affected area(s) of skin.).     Marland Kitchen spironolactone (ALDACTONE) 25 MG tablet Take 25 mg by mouth daily.     . SUMAtriptan (IMITREX) 100 MG tablet Take 100 mg by mouth every 6 (six) hours as needed for migraine. May repeat in 2 hours if headache persists or recurs.    . traMADol (ULTRAM) 50 MG tablet Take 1 tablet (50 mg total) by mouth every 6 (six) hours as needed. 8 tablet 0   No current facility-administered medications on file prior to visit.     Cardiovascular studies:  EKG 10/04/2018: Sinus  Rhythm  Left bundle branch block.   Echocardiogram 09/24/2018: Left ventricle cavity is normal in size. Moderate concentric hypertrophy of the left ventricle. Abnormal septal wall motion due to left bundle branch block. Doppler evidence of grade I (impaired) diastolic dysfunction, indeterminate LAP. Calculated EF 20%. Left atrial cavity is mildly dilated. Mild (Grade I) mitral regurgitation. Mild tricuspid regurgitation. Estimated pulmonary artery systolic pressure 26  mmHg. No significant change compared to previous study on 05/02/2018.  Coronary Angiogram [12/18/2017]: [facility] 12/18/2017: Angiographically normal coronary arteries with no coronary artery disease  Nonischemic cardiomyopathy Moderate WHO Grp II pulmonary hypertension.  RV: 46/6 mmHg. RVEDP 11 mmHg PA 56/28 mmHg, mean PAP 40 mmHg PW: 27 mmHg LV 137/0 mmHg with LVEDP 36 mmHg  CO 5.2 L/min CI 2.2 L/min/m2  Review of Systems  Constitution: Positive for malaise/fatigue. Negative for decreased appetite, weight gain and weight loss.  HENT: Negative for congestion.   Eyes: Negative for visual disturbance.  Cardiovascular: Positive for dyspnea on exertion. Negative for chest pain, leg swelling, palpitations and syncope.  Respiratory: Negative for shortness of breath.   Endocrine: Negative for cold intolerance.  Hematologic/Lymphatic: Does not bruise/bleed easily.  Skin: Negative for itching and rash.  Musculoskeletal: Negative for myalgias.  Gastrointestinal: Negative for abdominal pain, nausea and vomiting.  Genitourinary: Negative for dysuria.  Neurological: Negative for dizziness and weakness.  Psychiatric/Behavioral: The patient is not nervous/anxious.   All other systems reviewed and are negative.      Objective:   Today's Vitals   04/16/19 1320  BP: 135/66  Pulse: 66  Weight: (!) 319 lb (144.7 kg)  Height: 5\' 6"  (1.676 m)   Body mass index is 51.49 kg/m.    Physical Exam  Constitutional: She is oriented to person, place, and time. She appears well-developed and well-nourished. No distress.  Pulmonary/Chest: Effort normal.  Musculoskeletal:        General: No edema.  Neurological: She is alert and oriented to person, place, and time.  Psychiatric: She has a normal mood and affect.  Nursing note and vitals reviewed.       Assessment & Recommendations:   43 y/o Caucasian female with  nonischemic dilated cardiomyopathy, now s/p BiVICD placement, Hashimoto thyroiditis, systemic sclerosis, Sjogren's disease, Raynaud's syndrome, morbid obesity, endometriosis, menorrhagia.  1. Chronic systolic heart failure [facility]):  Nonischemic cardiomyopathy. NYHA class II-III  symptoms.  Increase spironolactone to  50 mg daily, corlanor to 7.5 mg bid.  Continue Entresto 97-103 mg bid, metoprolol succinate to 50 mg daily. S/p Biv ICD. Encourage increased physical activity and weight loss.  BMP check in two weeks.  Repeat echocardiogram and OV in two months.   2. Hypertension:     Controlled       Xzandria Clevinger Emiliano DyerJ Pepper Kerrick, MD [facility] Pager: [phone] Office: [phone] If no answer Cell [phone]

## 2019-04-23 ENCOUNTER — Ambulatory Visit
Admission: RE | Admit: 2019-04-23 | Discharge: 2019-04-23 | Disposition: A | Discharge status: Home / Self Care | Payer: Medicaid Other | Source: Ambulatory Visit | Attending: Gastroenterology | Admitting: Gastroenterology

## 2019-04-23 ENCOUNTER — Other Ambulatory Visit: Payer: Self-pay

## 2019-04-23 DIAGNOSIS — R1031 Right lower quadrant pain: Secondary | ICD-10-CM

## 2019-04-23 DIAGNOSIS — R1012 Left upper quadrant pain: Secondary | ICD-10-CM

## 2019-04-23 MED ORDER — IOPAMIDOL (ISOVUE-300) INJECTION 61%
125.0000 mL | Freq: Once | INTRAVENOUS | Status: AC | PRN
  Administered 2019-04-23: 125 mL via INTRAVENOUS

## 2019-05-12 ENCOUNTER — Ambulatory Visit (INDEPENDENT_AMBULATORY_CARE_PROVIDER_SITE_OTHER): Payer: Medicaid Other | Admitting: *Deleted

## 2019-05-12 DIAGNOSIS — I429 Cardiomyopathy, unspecified: Secondary | ICD-10-CM

## 2019-05-12 LAB — CUP PACEART REMOTE DEVICE CHECK
Battery Remaining Longevity: 85 mo
Battery Remaining Percentage: 92 %
Battery Voltage: 3.16 V
Brady Statistic AP VP Percent: 6.1 %
Brady Statistic AP VS Percent: 1 %
Brady Statistic AS VP Percent: 93 %
Brady Statistic AS VS Percent: 1 %
Brady Statistic RA Percent Paced: 6.9 %
Date Time Interrogation Session: 20200920115753
HighPow Impedance: 77 Ohm
HighPow Impedance: 77 Ohm
Implantable Lead Implant Date: 20200306
Implantable Lead Implant Date: 20200306
Implantable Lead Implant Date: 20200306
Implantable Lead Location: 753858
Implantable Lead Location: 753859
Implantable Lead Location: 753860
Implantable Lead Model: 7122
Implantable Pulse Generator Implant Date: 20200306
Lead Channel Impedance Value: 1050 Ohm
Lead Channel Impedance Value: 440 Ohm
Lead Channel Impedance Value: 480 Ohm
Lead Channel Pacing Threshold Amplitude: 0.375 V
Lead Channel Pacing Threshold Amplitude: 0.625 V
Lead Channel Pacing Threshold Amplitude: 1 V
Lead Channel Pacing Threshold Pulse Width: 0.5 ms
Lead Channel Pacing Threshold Pulse Width: 0.5 ms
Lead Channel Pacing Threshold Pulse Width: 0.5 ms
Lead Channel Sensing Intrinsic Amplitude: 11.9 mV
Lead Channel Sensing Intrinsic Amplitude: 5 mV
Lead Channel Setting Pacing Amplitude: 1.625
Lead Channel Setting Pacing Amplitude: 2 V
Lead Channel Setting Pacing Amplitude: 2 V
Lead Channel Setting Pacing Pulse Width: 0.5 ms
Lead Channel Setting Pacing Pulse Width: 0.5 ms
Lead Channel Setting Sensing Sensitivity: 0.5 mV
Pulse Gen Serial Number: 9878925

## 2019-05-20 ENCOUNTER — Encounter: Payer: Self-pay | Admitting: Cardiology

## 2019-05-20 NOTE — Progress Notes (Signed)
Remote ICD transmission.   

## 2019-05-24 ENCOUNTER — Other Ambulatory Visit: Payer: Self-pay | Admitting: Neurology

## 2019-06-09 ENCOUNTER — Other Ambulatory Visit: Payer: Self-pay

## 2019-06-09 MED ORDER — FUROSEMIDE 20 MG PO TABS: 20.0000 mg | ORAL_TABLET | ORAL | 2 refills | Status: DC | PRN

## 2019-06-27 ENCOUNTER — Ambulatory Visit (INDEPENDENT_AMBULATORY_CARE_PROVIDER_SITE_OTHER): Payer: Medicaid Other

## 2019-06-27 ENCOUNTER — Other Ambulatory Visit: Payer: Self-pay

## 2019-06-27 ENCOUNTER — Ambulatory Visit: Payer: Medicaid Other | Admitting: Cardiology

## 2019-06-27 DIAGNOSIS — I5022 Chronic systolic (congestive) heart failure: Secondary | ICD-10-CM

## 2019-06-28 NOTE — Progress Notes (Signed)
Patient is here for follow up visit.  Subjective:   @Patient  ID: Sarah English, female    DOB: 05/27/1976, 43 y.o.   MRN: [id number]   Chief Complaint  Patient presents with  . Cardiomyopathy  . Follow-up    HPI   43 y/o Caucasian female with  nonischemic dilated cardiomyopathy, now s/p BiVICD placement, Hashimoto thyroiditis, systemic sclerosis, Sjogren's disease, Raynaud's syndrome, morbid obesity, endometriosis, menorrhagia.  Patient recently had a bout of diverticulitis, better now. She continues to have mild exertional dyspnea. She has lost 14 lbs since her last visit. She only has to take lasix occasionally.   Past Medical History:  Diagnosis Date  . Anemia   . Autoimmune disorder (HCC)   . Bronchitis    hx -uses inhaler prn  . Chronic kidney disease    kidney infections  . Endometriosis   . Fibromyalgia   . GERD (gastroesophageal reflux disease)    occasional  . Hashimoto thyroiditis, fibrous variant   . Hashimoto's disease   . Headache    migraines  . History of kidney stones   . Hypertension   . IVCD (intraventricular conduction defect)   . LBBB (left bundle branch block)   . Nonischemic cardiomyopathy (HCC)    per stress test 12-17-2017  ef 14%;  per echo 12-13-2017 ef 21%  . Peripheral vascular disease (HCC)    raynoids syndrome  . Pneumonia    only x 1 - 2- 3 months ago  . 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 Scleroderma (HCC)   . Sjogren's syndrome (HCC)   . Sleep apnea    recently dx 04/13/18, does not have CPAP machine yet.  . Systemic sclerosis (HCC)   . Systolic CHF with reduced left ventricular function, NYHA class 3 [facility](HCC)    cardiologist-  dr Evlyn Clinesmanish Cyra Spader  . Tendinitis    lower back  . Thyroiditis     Past Surgical History:  Procedure Laterality Date  . BIV ICD INSERTION CRT-D N/A 10/25/2018   Procedure: BIV ICD INSERTION CRT-D;  Surgeon: Marinus Mawaylor, Gregg W, MD;  [facility];  Service: Cardiovascular;  Laterality: N/A;  . CESAREAN SECTION  2004   x 1  . CHOLECYSTECTOMY    . COLONOSCOPY    . CYSTOSCOPY     x 2 - bladder as a child  . HYSTEROSCOPY W/D&C N/A 04/16/2018   Procedure: DILATATION AND CURETTAGE /HYSTEROSCOPY;  Surgeon: Lavina HammanMeisinger, Todd, MD;  Location: [facility] ORS;  Service: Gynecology;  Laterality: N/A;  . LAPAROSCOPIC LYSIS OF ADHESIONS  04/16/2018   Procedure: LAPAROSCOPIC LYSIS OF ADHESIONS;  Surgeon: Lavina HammanMeisinger, Todd, MD;  Location: [facility] ORS;  Service: Gynecology;;  . MANDIBLE FRACTURE SURGERY  1994   upper mandibular  . RIGHT/LEFT HEART CATH AND CORONARY ANGIOGRAPHY N/A 12/18/2017   Procedure: RIGHT/LEFT HEART CATH AND CORONARY ANGIOGRAPHY;  Surgeon: Elder NegusPatwardhan, Cristela Stalder J, MD;  Location: [facility] INVASIVE CV LAB;  Service: Cardiovascular;  Laterality: N/A;  . UPPER GI ENDOSCOPY    . WISDOM TOOTH EXTRACTION      Social History   Socioeconomic History  . Marital status: Single    Spouse name: Not on file  . Number of children: 1  . Years of education: 7414  . Highest education level: Associate degree: occupational, Scientist, product/process developmenttechnical, or vocational program  Occupational History  . Occupation: not working  Engineer, productionocial Needs  . Financial resource strain: Not on file  .  Food insecurity    Worry: Not on file    Inability: Not on file  . Transportation needs    Medical: Not on file    Non-medical: Not on file  Tobacco Use  . Smoking status: Former Smoker    Packs/day: 0.50    Years: 8.00    Pack years: 4.00    Types: Cigarettes    Quit date: 12/10/2012    Years since quitting: 6.5  . Smokeless tobacco: Never Used  Substance and Sexual Activity  . Alcohol use: Yes    Comment: once a year  . Drug use: No  . Sexual activity: Yes    Birth control/protection: Pill  Lifestyle  . Physical activity    Days per week: Not on file    Minutes per session: Not on file  . Stress: Not on file  Relationships  . Social Musician on phone: Not on  file    Gets together: Not on file    Attends religious service: Not on file    Active member of club or organization: Not on file    Attends meetings of clubs or organizations: Not on file    Relationship status: Not on file  . Intimate partner violence    Fear of current or ex partner: Not on file    Emotionally abused: Not on file    Physically abused: Not on file    Forced sexual activity: Not on file  Other Topics Concern  . Not on file  Social History Narrative   Lives with son, renting a room from her mother.  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     Education: college.     Current Outpatient Medications on File Prior to Visit  Medication Sig Dispense Refill  . albuterol (PROVENTIL HFA;VENTOLIN HFA) 108 (90 Base) MCG/ACT inhaler Inhale into the lungs every 6 (six) hours as needed for wheezing or shortness of breath.    Marland Kitchen antiseptic oral rinse (BIOTENE) LIQD 1-2 application by Mouth Rinse route every hour as needed for dry mouth.    . cyclobenzaprine (FLEXERIL) 10 MG tablet Take 10 mg by mouth 3 (three) times daily as needed for muscle spasms.    . cycloSPORINE (RESTASIS) 0.05 % ophthalmic emulsion Place 1 drop into both eyes 2 (two) times daily.     Marland Kitchen etonogestrel-ethinyl estradiol (NUVARING) 0.12-0.015 MG/24HR vaginal ring Place 1 each vaginally every 28 (twenty-eight) days. Insert vaginally and leave in place for 4 consecutive weeks    . furosemide (LASIX) 20 MG tablet Take 1 tablet (20 mg total) by mouth as needed. 30 tablet 2  . gabapentin (NEURONTIN) 300 MG capsule TAKE 1 CAPSULE BY MOUTH THREE TIMES A DAY 90 capsule 0  . ivabradine (CORLANOR) 7.5 MG TABS tablet Take 1 tablet (7.5 mg total) by mouth 2 (two) times daily with a meal. 60 tablet 3  . Lactobacillus Rhamnosus, GG, (CULTURELLE PROBIOTICS KIDS PO) Take 1 tablet by mouth daily.    . Magnesium 200 MG TABS Take 1 tablet by mouth daily.    . metoprolol succinate (TOPROL-XL)  50 MG 24 hr tablet Take 1 tablet (50 mg total) by mouth daily. 90 tablet 3  . Multiple Vitamin (MULTIVITAMIN) tablet Take 1 tablet by mouth daily.    . pantoprazole (PROTONIX) 40 MG tablet Take 40 mg by mouth daily.    Marland Kitchen PAZEO 0.7 % SOLN Place 1 drop into both eyes  daily.    . sacubitril-valsartan (ENTRESTO) 97-103 MG Take 1 tablet by mouth 2 (two) times daily.     . saline (AYR) GEL Place 1 application into the nose daily.    Marland Kitchen selenium (SELENIMIN-200) 200 MCG TABS tablet Take 200 mcg by mouth daily.     . silver sulfADIAZINE (SILVADENE) 1 % cream Apply 1 application topically 2 (two) times daily as needed (wounds/affected area(s) of skin.).     Marland Kitchen spironolactone (ALDACTONE) 50 MG tablet Take 1 tablet (50 mg total) by mouth daily. 30 tablet 3  . SUMAtriptan (IMITREX) 100 MG tablet Take 100 mg by mouth every 6 (six) hours as needed for migraine. May repeat in 2 hours if headache persists or recurs.    . traMADol (ULTRAM) 50 MG tablet Take 1 tablet (50 mg total) by mouth every 6 (six) hours as needed. 8 tablet 0   No current facility-administered medications on file prior to visit.     Cardiovascular studies:  Echocardiogram 06/27/2019: Left ventricle cavity is mildly dilated. Mild concentric hypertrophy of the left ventricle. Moderately depressed LV systolic function with visual EF 30-35%. Normal global wall motion. Doppler evidence of grade I (impaired) diastolic dysfunction, normal LAP.  Device lead seen in RA/RV. Trileaflet aortic valve with trace aortic stenosis. No regurgitation noted. Inadequate TR jet to estimate pulmonary artery systolic pressure. Normal right atrial pressure.  Compared to previous study on 09/24/2018, LVEF is increased from 20-25% to 30-35%.  EKG 10/04/2018: Sinus  Rhythm  Left bundle branch block.   Echocardiogram 09/24/2018: Left ventricle cavity is normal in size. Moderate concentric hypertrophy of the left ventricle. Abnormal septal wall motion due to left  bundle branch block. Doppler evidence of grade I (impaired) diastolic dysfunction, indeterminate LAP. Calculated EF 20%. Left atrial cavity is mildly dilated. Mild (Grade I) mitral regurgitation. Mild tricuspid regurgitation. Estimated pulmonary artery systolic pressure 26  mmHg. No significant change compared to previous study on 05/02/2018.  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  RV: 46/6 mmHg. RVEDP 11 mmHg PA 56/28 mmHg, mean PAP 40 mmHg PW: 27 mmHg LV 137/0 mmHg with LVEDP 36 mmHg  CO 5.2 L/min CI 2.2 L/min/m2  Review of Systems  Constitution: Positive for malaise/fatigue. Negative for decreased appetite, weight gain and weight loss.  HENT: Negative for congestion.   Eyes: Negative for visual disturbance.  Cardiovascular: Positive for dyspnea on exertion. Negative for chest pain, leg swelling, palpitations and syncope.  Respiratory: Negative for shortness of breath.   Endocrine: Negative for cold intolerance.  Hematologic/Lymphatic: Does not bruise/bleed easily.  Skin: Negative for itching and rash.  Musculoskeletal: Negative for myalgias.  Gastrointestinal: Negative for abdominal pain, nausea and vomiting.  Genitourinary: Negative for dysuria.  Neurological: Negative for dizziness and weakness.  Psychiatric/Behavioral: The patient is not nervous/anxious.   All other systems reviewed and are negative.      Objective:    Today's Vitals   06/02/19 1552 07/03/19 1547  BP: 126/72   Pulse: 81   Weight:  (!) 305 lb (138.3 kg)  Height:  5\' 6"  (1.676 m)   Body mass index is 49.23 kg/m.    Physical Exam  Constitutional: She is oriented to person, place, and time. She appears well-developed and well-nourished. No distress.  Pulmonary/Chest: Effort normal.  Musculoskeletal:        General: No edema.   Neurological: She is alert and oriented to person,  place, and time.  Psychiatric: She has a normal mood and affect.  Nursing note and vitals reviewed.       Assessment & Recommendations:   43 y/o Caucasian female with  nonischemic dilated cardiomyopathy, now s/p BiVICD placement, Hashimoto thyroiditis, systemic sclerosis, Sjogren's disease, Raynaud's syndrome, morbid obesity, endometriosis, menorrhagia.  1. Chronic systolic heart failure [facility]):  Nonischemic cardiomyopathy. NYHA class II-III symptoms. Continue Entresto 97-103 mg bid, metoprolol succinate to 50 mg daily,  spironolactone to 50 mg daily, corlanor to 7.5 mg bid. Mild improvement in LVEF now at 30-35%. S/p Biv ICD. Added Farxiga 5 mg daily. Risks/benefits explained to the patient. Samples will be given. Encourage increased physical activity and weight loss.   2. Hypertension:     Controlled      F/u in 3 months/   Eliani Leclere Esther Hardy, MD [facility] [place] Pager: [phone] Office: [phone] If no answer Cell [phone]

## 2019-07-03 ENCOUNTER — Encounter: Payer: Self-pay | Admitting: Cardiology

## 2019-07-03 ENCOUNTER — Other Ambulatory Visit: Payer: Self-pay

## 2019-07-03 ENCOUNTER — Telehealth: Payer: Medicaid Other | Admitting: Cardiology

## 2019-07-03 VITALS — BP 126/72 | HR 81 | Ht 66.0 in | Wt 305.0 lb

## 2019-07-03 DIAGNOSIS — Z95810 Presence of automatic (implantable) cardiac defibrillator: Secondary | ICD-10-CM | POA: Diagnosis not present

## 2019-07-03 DIAGNOSIS — I10 Essential (primary) hypertension: Secondary | ICD-10-CM | POA: Diagnosis not present

## 2019-07-03 DIAGNOSIS — I5022 Chronic systolic (congestive) heart failure: Secondary | ICD-10-CM | POA: Diagnosis not present

## 2019-07-03 DIAGNOSIS — I428 Other cardiomyopathies: Secondary | ICD-10-CM | POA: Diagnosis not present

## 2019-07-03 DIAGNOSIS — I429 Cardiomyopathy, unspecified: Secondary | ICD-10-CM | POA: Insufficient documentation

## 2019-07-03 MED ORDER — FARXIGA 5 MG PO TABS: 5.0000 mg | ORAL_TABLET | Freq: Every day | ORAL | 3 refills | Status: DC

## 2019-07-07 ENCOUNTER — Encounter: Payer: Self-pay | Admitting: Adult Health

## 2019-07-07 ENCOUNTER — Ambulatory Visit: Payer: Medicaid Other | Admitting: Adult Health

## 2019-07-08 ENCOUNTER — Other Ambulatory Visit: Payer: Self-pay | Admitting: Neurology

## 2019-07-08 ENCOUNTER — Telehealth: Payer: Self-pay

## 2019-07-08 NOTE — Telephone Encounter (Signed)
I contacted patient advised patient needs to be seen.

## 2019-07-16 ENCOUNTER — Other Ambulatory Visit: Payer: Self-pay

## 2019-08-11 ENCOUNTER — Telehealth: Payer: Self-pay

## 2019-08-11 ENCOUNTER — Ambulatory Visit (INDEPENDENT_AMBULATORY_CARE_PROVIDER_SITE_OTHER): Payer: Medicaid Other | Admitting: *Deleted

## 2019-08-11 ENCOUNTER — Ambulatory Visit: Payer: Medicaid Other | Admitting: Adult Health

## 2019-08-11 ENCOUNTER — Encounter: Payer: Self-pay | Admitting: Adult Health

## 2019-08-11 DIAGNOSIS — I429 Cardiomyopathy, unspecified: Secondary | ICD-10-CM

## 2019-08-11 LAB — CUP PACEART REMOTE DEVICE CHECK
Battery Remaining Longevity: 84 mo
Battery Remaining Percentage: 90 %
Battery Voltage: 3.1 V
Brady Statistic AP VP Percent: 5.1 %
Brady Statistic AP VS Percent: 1 %
Brady Statistic AS VP Percent: 94 %
Brady Statistic AS VS Percent: 1 %
Brady Statistic RA Percent Paced: 5.8 %
Date Time Interrogation Session: 20201221020016
HighPow Impedance: 72 Ohm
HighPow Impedance: 72 Ohm
Implantable Lead Implant Date: 20200306
Implantable Lead Implant Date: 20200306
Implantable Lead Implant Date: 20200306
Implantable Lead Location: 753858
Implantable Lead Location: 753859
Implantable Lead Location: 753860
Implantable Lead Model: 7122
Implantable Pulse Generator Implant Date: 20200306
Lead Channel Impedance Value: 1100 Ohm
Lead Channel Impedance Value: 440 Ohm
Lead Channel Impedance Value: 510 Ohm
Lead Channel Pacing Threshold Amplitude: 0.5 V
Lead Channel Pacing Threshold Amplitude: 0.625 V
Lead Channel Pacing Threshold Amplitude: 1.125 V
Lead Channel Pacing Threshold Pulse Width: 0.5 ms
Lead Channel Pacing Threshold Pulse Width: 0.5 ms
Lead Channel Pacing Threshold Pulse Width: 0.5 ms
Lead Channel Sensing Intrinsic Amplitude: 12 mV
Lead Channel Sensing Intrinsic Amplitude: 5 mV
Lead Channel Setting Pacing Amplitude: 1.625
Lead Channel Setting Pacing Amplitude: 2 V
Lead Channel Setting Pacing Amplitude: 2.125
Lead Channel Setting Pacing Pulse Width: 0.5 ms
Lead Channel Setting Pacing Pulse Width: 0.5 ms
Lead Channel Setting Sensing Sensitivity: 0.5 mV
Pulse Gen Serial Number: 9878925

## 2019-08-11 NOTE — Telephone Encounter (Signed)
Patient was a no call/no show for their appointment today.   

## 2019-08-27 ENCOUNTER — Other Ambulatory Visit: Payer: Self-pay | Admitting: Family Medicine

## 2019-08-27 ENCOUNTER — Ambulatory Visit
Admission: RE | Admit: 2019-08-27 | Discharge: 2019-08-27 | Disposition: A | Discharge status: Home / Self Care | Payer: Medicaid Other | Source: Ambulatory Visit | Attending: Family Medicine | Admitting: Family Medicine

## 2019-08-27 ENCOUNTER — Other Ambulatory Visit: Payer: Self-pay

## 2019-08-27 DIAGNOSIS — M25572 Pain in left ankle and joints of left foot: Secondary | ICD-10-CM

## 2019-09-06 ENCOUNTER — Other Ambulatory Visit: Payer: Self-pay | Admitting: Cardiology

## 2019-09-06 DIAGNOSIS — I5022 Chronic systolic (congestive) heart failure: Secondary | ICD-10-CM

## 2019-09-08 ENCOUNTER — Other Ambulatory Visit: Payer: Self-pay | Admitting: Cardiology

## 2019-09-08 DIAGNOSIS — I5022 Chronic systolic (congestive) heart failure: Secondary | ICD-10-CM

## 2019-09-15 ENCOUNTER — Other Ambulatory Visit: Payer: Self-pay

## 2019-09-15 MED ORDER — SACUBITRIL-VALSARTAN 97-103 MG PO TABS: 1.0000 | ORAL_TABLET | Freq: Two times a day (BID) | ORAL | 1 refills | Status: DC

## 2019-10-03 ENCOUNTER — Ambulatory Visit: Payer: Medicaid Other | Admitting: Cardiology

## 2019-10-03 NOTE — Progress Notes (Deleted)
Patient is here for follow up visit.  Subjective:   @Patient  ID: , female    DOB: [birth year], 44 y.o.   MRN: [id number]   No chief complaint on file.   HPI   44 y/o Caucasian female with  nonischemic dilated cardiomyopathy, now s/p BiVICD placement, Hashimoto thyroiditis, systemic sclerosis, Sjogren's disease, Raynaud's syndrome, morbid obesity, endometriosis, menorrhagia.  Patient recently had a bout of diverticulitis, better now. She continues to have mild exertional dyspnea. She has lost 14 lbs since her last visit. She only has to take lasix occasionally.   Past Medical History:  Diagnosis Date  . Anemia   . Autoimmune disorder (HCC)   . Bronchitis    hx -uses inhaler prn  . Chronic kidney disease    kidney infections  . Endometriosis   . Fibromyalgia   . GERD (gastroesophageal reflux disease)    occasional  . Hashimoto thyroiditis, fibrous variant   . Hashimoto's disease   . Headache    migraines  . History of kidney stones   . Hypertension   . IVCD (intraventricular conduction defect)   . LBBB (left bundle branch block)   . Nonischemic cardiomyopathy (HCC)    per stress test 12-17-2017  ef 14%;  per echo 12-13-2017 ef 21%  . Peripheral vascular disease (HCC)    raynoids syndrome  . Pneumonia    only x 1 - 2- 3 months ago  . 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 Scleroderma (HCC)   . Sjogren's syndrome (HCC)   . Sleep apnea    recently dx 04/13/18, does not have CPAP machine yet.  . Systemic sclerosis (HCC)   . Systolic CHF with reduced left ventricular function, NYHA class 3 [facility])    cardiologist-  dr [facility] Mayline Dragon  . Tendinitis    lower back  . Thyroiditis     Past Surgical History:  Procedure Laterality Date  . BIV ICD INSERTION CRT-D N/A 10/25/2018   Procedure: BIV ICD INSERTION CRT-D;  Surgeon: 12/25/2018, MD;  Location: [facility] INVASIVE CV LAB;  Service: Cardiovascular;   Laterality: N/A;  . CESAREAN SECTION  2004   x 1  . CHOLECYSTECTOMY    . COLONOSCOPY    . CYSTOSCOPY     x 2 - bladder as a child  . HYSTEROSCOPY WITH D & C N/A 04/16/2018   Procedure: DILATATION AND CURETTAGE /HYSTEROSCOPY;  Surgeon: 04/18/2018, MD;  Location: [facility];  Service: Gynecology;  Laterality: N/A;  . LAPAROSCOPIC LYSIS OF ADHESIONS  04/16/2018   Procedure: LAPAROSCOPIC LYSIS OF ADHESIONS;  Surgeon: 04/18/2018, MD;  Location: [facility];  Service: Gynecology;;  . MANDIBLE FRACTURE SURGERY  1994   upper mandibular  . RIGHT/LEFT HEART CATH AND CORONARY ANGIOGRAPHY N/A 12/18/2017   Procedure: RIGHT/LEFT HEART CATH AND CORONARY ANGIOGRAPHY;  Surgeon: 12/20/2017, MD;  Location: [facility] INVASIVE CV LAB;  Service: Cardiovascular;  Laterality: N/A;  . UPPER GI ENDOSCOPY    . WISDOM TOOTH EXTRACTION      Social History   Socioeconomic History  . Marital status: Single    Spouse name: Not on file  . Number of children: 1  . Years of education: 50  . Highest education level: Associate degree: occupational, 18, or vocational program  Occupational History  . Occupation: not working  Tobacco Use  . Smoking status: Former Smoker    Packs/day: 0.50  Years: 8.00    Pack years: 4.00    Types: Cigarettes    Quit date: 12/10/2012    Years since quitting: 6.8  . Smokeless tobacco: Never Used  Substance and Sexual Activity  . Alcohol use: Yes    Comment: once a year  . Drug use: No  . Sexual activity: Yes    Birth control/protection: Pill  Other Topics Concern  . Not on file  Social History Narrative   Lives with son, renting a room from her mother.  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     Education: college.    Social Determinants of Health   Financial Resource Strain:   . Difficulty of Paying Living Expenses: Not on file  Food Insecurity:   . Worried About Charity fundraiser in the Last Year: Not on file  .  Ran Out of Food in the Last Year: Not on file  Transportation Needs:   . Lack of Transportation (Medical): Not on file  . Lack of Transportation (Non-Medical): Not on file  Physical Activity:   . Days of Exercise per Week: Not on file  . Minutes of Exercise per Session: Not on file  Stress:   . Feeling of Stress : Not on file  Social Connections:   . Frequency of Communication with Friends and Family: Not on file  . Frequency of Social Gatherings with Friends and Family: Not on file  . Attends Religious Services: Not on file  . Active Member of Clubs or Organizations: Not on file  . Attends Archivist Meetings: Not on file  . Marital Status: Not on file  Intimate Partner Violence:   . Fear of Current or Ex-Partner: Not on file  . Emotionally Abused: Not on file  . Physically Abused: Not on file  . Sexually Abused: Not on file    Current Outpatient Medications on File Prior to Visit  Medication Sig Dispense Refill  . albuterol (PROVENTIL HFA;VENTOLIN HFA) 108 (90 Base) MCG/ACT inhaler Inhale into the lungs every 6 (six) hours as needed for wheezing or shortness of breath.    Marland Kitchen antiseptic oral rinse (BIOTENE) LIQD 1-2 application by Mouth Rinse route every hour as needed for dry mouth.    . CORLANOR 7.5 MG TABS tablet TAKE 1 TABLET BY MOUTH TWICE A DAY WITH MEALS 60 tablet 3  . cyclobenzaprine (FLEXERIL) 10 MG tablet Take 10 mg by mouth 3 (three) times daily as needed for muscle spasms.    . cycloSPORINE (RESTASIS) 0.05 % ophthalmic emulsion Place 1 drop into both eyes 2 (two) times daily.     . dapagliflozin propanediol (FARXIGA) 5 MG TABS tablet Take 5 mg by mouth daily. 30 tablet 3  . etonogestrel-ethinyl estradiol (NUVARING) 0.12-0.015 MG/24HR vaginal ring Place 1 each vaginally every 28 (twenty-eight) days. Insert vaginally and leave in place for 4 consecutive weeks    . furosemide (LASIX) 20 MG tablet Take 1 tablet (20 mg total) by mouth as needed. 30 tablet 2  .  gabapentin (NEURONTIN) 300 MG capsule TAKE 1 CAPSULE BY MOUTH THREE TIMES A DAY 90 capsule 0  . Lactobacillus Rhamnosus, GG, (CULTURELLE PROBIOTICS KIDS PO) Take 1 tablet by mouth daily.    . Magnesium 200 MG TABS Take 1 tablet by mouth daily.    . metoprolol succinate (TOPROL-XL) 50 MG 24 hr tablet Take 1 tablet (50 mg total) by mouth daily. 90 tablet 3  . Multiple  Vitamin (MULTIVITAMIN) tablet Take 1 tablet by mouth daily.    . pantoprazole (PROTONIX) 40 MG tablet Take 40 mg by mouth daily.    Marland Kitchen PAZEO 0.7 % SOLN Place 1 drop into both eyes daily.    . sacubitril-valsartan (ENTRESTO) 97-103 MG Take 1 tablet by mouth 2 (two) times daily. 180 tablet 1  . saline (AYR) GEL Place 1 application into the nose daily.    Marland Kitchen selenium (SELENIMIN-200) 200 MCG TABS tablet Take 200 mcg by mouth daily.     . silver sulfADIAZINE (SILVADENE) 1 % cream Apply 1 application topically 2 (two) times daily as needed (wounds/affected area(s) of skin.).     Marland Kitchen spironolactone (ALDACTONE) 50 MG tablet TAKE 1 TABLET BY MOUTH EVERY DAY 30 tablet 3  . SUMAtriptan (IMITREX) 100 MG tablet Take 100 mg by mouth every 6 (six) hours as needed for migraine. May repeat in 2 hours if headache persists or recurs.    . traMADol (ULTRAM) 50 MG tablet Take 1 tablet (50 mg total) by mouth every 6 (six) hours as needed. 8 tablet 0   No current facility-administered medications on file prior to visit.    Cardiovascular studies:  Echocardiogram 06/27/2019: Left ventricle cavity is mildly dilated. Mild concentric hypertrophy of the left ventricle. Moderately depressed LV systolic function with visual EF 30-35%. Normal global wall motion. Doppler evidence of grade I (impaired) diastolic dysfunction, normal LAP.  Device lead seen in RA/RV. Trileaflet aortic valve with trace aortic stenosis. No regurgitation noted. Inadequate TR jet to estimate pulmonary artery systolic pressure. Normal right atrial pressure.  Compared to previous study on  09/24/2018, LVEF is increased from 20-25% to 30-35%.  EKG 10/04/2018: Sinus  Rhythm  Left bundle branch block.   Echocardiogram 09/24/2018: Left ventricle cavity is normal in size. Moderate concentric hypertrophy of the left ventricle. Abnormal septal wall motion due to left bundle branch block. Doppler evidence of grade I (impaired) diastolic dysfunction, indeterminate LAP. Calculated EF 20%. Left atrial cavity is mildly dilated. Mild (Grade I) mitral regurgitation. Mild tricuspid regurgitation. Estimated pulmonary artery systolic pressure 26  mmHg. No significant change compared to previous study on 05/02/2018.  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  RV: 46/6 mmHg. RVEDP 11 mmHg PA 56/28 mmHg, mean PAP 40 mmHg PW: 27 mmHg LV 137/0 mmHg with LVEDP 36 mmHg  CO 5.2 L/min CI 2.2 L/min/m2  Review of Systems  Constitution: Positive for malaise/fatigue.  Cardiovascular: Positive for dyspnea on exertion. Negative for chest pain, leg swelling, palpitations and syncope.  All other systems reviewed and are negative.      Objective:   *** There were no vitals filed for this visit. There is no height or weight on file to calculate BMI.    Physical Exam  Constitutional: She is oriented to person, place, and time. She appears well-developed and well-nourished. No distress.  Pulmonary/Chest: Effort normal.  Musculoskeletal:        General: No edema.  Neurological: She is alert and oriented to person, place, and time.  Psychiatric: She has a normal mood and affect.  Nursing note and vitals reviewed.       Assessment & Recommendations:   44 y/o Caucasian female with  nonischemic dilated cardiomyopathy, now s/p BiVICD placement, Hashimoto thyroiditis, systemic sclerosis, Sjogren's disease, Raynaud's syndrome, morbid obesity,  endometriosis, menorrhagia.  1. Chronic systolic heart failure [facility]):  Nonischemic cardiomyopathy. NYHA class II-III  symptoms. Continue Entresto 97-103 mg bid, metoprolol succinate to 50 mg daily,  spironolactone to 50 mg daily, corlanor to 7.5 mg bid. Mild improvement in LVEF now at 30-35%. S/p Biv ICD. Added Farxiga 5 mg daily. Risks/benefits explained to the patient. Samples will be given. Encourage increased physical activity and weight loss.   2. Hypertension:     Controlled      F/u in 3 months/   Cherith Tewell Emiliano Dyer, MD [facility]. [place] Pager: [phone] Office: [phone] If no answer Cell [phone]

## 2019-10-09 ENCOUNTER — Other Ambulatory Visit: Payer: Self-pay | Admitting: Neurology

## 2019-10-09 ENCOUNTER — Telehealth: Payer: Medicaid Other | Admitting: Cardiology

## 2019-10-09 ENCOUNTER — Other Ambulatory Visit: Payer: Self-pay

## 2019-10-09 VITALS — BP 125/76 | HR 87

## 2019-10-09 DIAGNOSIS — I428 Other cardiomyopathies: Secondary | ICD-10-CM

## 2019-10-09 DIAGNOSIS — I10 Essential (primary) hypertension: Secondary | ICD-10-CM

## 2019-10-09 NOTE — Progress Notes (Signed)
Patient is here for follow up visit.  Subjective:   _0  ID: Carney Corners, female    DOB: Feb 22, 1976, 44 y.o.   MRN: [id number]   Chief Complaint  Patient presents with  . Cardiomyopathy    HPI   44 y/o Caucasian female with  nonischemic dilated cardiomyopathy, now s/p BiVICD placement, Hashimoto thyroiditis, systemic sclerosis, Sjogren's disease, Raynaud's syndrome, morbid obesity, endometriosis, menorrhagia.  Patient has been compliant with her medical therapy. However, she continues to have leg swelling and occasional lightheadedness symptoms.  Pressure is not low. She does not check her weight regularly.    Current Outpatient Medications on File Prior to Visit  Medication Sig Dispense Refill  . albuterol (PROVENTIL HFA;VENTOLIN HFA) 108 (90 Base) MCG/ACT inhaler Inhale into the lungs every 6 (six) hours as needed for wheezing or shortness of breath.    Marland Kitchen antiseptic oral rinse (BIOTENE) LIQD 1-2 application by Mouth Rinse route every hour as needed for dry mouth.    . CORLANOR 7.5 MG TABS tablet TAKE 1 TABLET BY MOUTH TWICE A DAY WITH MEALS 60 tablet 3  . cyclobenzaprine (FLEXERIL) 10 MG tablet Take 10 mg by mouth 3 (three) times daily as needed for muscle spasms.    . cycloSPORINE (RESTASIS) 0.05 % ophthalmic emulsion Place 1 drop into both eyes 2 (two) times daily.     . dapagliflozin propanediol (FARXIGA) 5 MG TABS tablet Take 5 mg by mouth daily. 30 tablet 3  . etonogestrel-ethinyl estradiol (NUVARING) 0.12-0.015 MG/24HR vaginal ring Place 1 each vaginally every 28 (twenty-eight) days. Insert vaginally and leave in place for 4 consecutive weeks    . furosemide (LASIX) 20 MG tablet Take 1 tablet (20 mg total) by mouth as needed. 30 tablet 2  . gabapentin (NEURONTIN) 300 MG capsule TAKE 1 CAPSULE BY MOUTH THREE TIMES A DAY 90 capsule 0  . Lactobacillus Rhamnosus, GG, (CULTURELLE PROBIOTICS KIDS PO) Take 1 tablet by mouth daily.    . Magnesium 200 MG TABS Take 1 tablet by  mouth daily.    . metoprolol succinate (TOPROL-XL) 50 MG 24 hr tablet Take 1 tablet (50 mg total) by mouth daily. 90 tablet 3  . Multiple Vitamin (MULTIVITAMIN) tablet Take 1 tablet by mouth daily.    . pantoprazole (PROTONIX) 40 MG tablet Take 40 mg by mouth daily.    Marland Kitchen PAZEO 0.7 % SOLN Place 1 drop into both eyes daily.    . sacubitril-valsartan (ENTRESTO) 97-103 MG Take 1 tablet by mouth 2 (two) times daily. 180 tablet 1  . saline (AYR) GEL Place 1 application into the nose daily.    Marland Kitchen selenium (SELENIMIN-200) 200 MCG TABS tablet Take 200 mcg by mouth daily.     . silver sulfADIAZINE (SILVADENE) 1 % cream Apply 1 application topically 2 (two) times daily as needed (wounds/affected area(s) of skin.).     Marland Kitchen spironolactone (ALDACTONE) 50 MG tablet TAKE 1 TABLET BY MOUTH EVERY DAY 30 tablet 3  . SUMAtriptan (IMITREX) 100 MG tablet Take 100 mg by mouth every 6 (six) hours as needed for migraine. May repeat in 2 hours if headache persists or recurs.    . traMADol (ULTRAM) 50 MG tablet Take 1 tablet (50 mg total) by mouth every 6 (six) hours as needed. 8 tablet 0   No current facility-administered medications on file prior to visit.    Cardiovascular studies:  Echocardiogram 06/27/2019: Left ventricle cavity is mildly dilated. Mild concentric hypertrophy of the left ventricle. Moderately depressed  LV systolic function with visual EF 30-35%. Normal global wall motion. Doppler evidence of grade I (impaired) diastolic dysfunction, normal LAP.  Device lead seen in RA/RV. Trileaflet aortic valve with trace aortic stenosis. No regurgitation noted. Inadequate TR jet to estimate pulmonary artery systolic pressure. Normal right atrial pressure.  Compared to previous study on 09/24/2018, LVEF is increased from 20-25% to 30-35%.  EKG 10/04/2018: Sinus  Rhythm  Left bundle branch block.   Echocardiogram 09/24/2018: Left ventricle cavity is normal in size. Moderate concentric hypertrophy of the left  ventricle. Abnormal septal wall motion due to left bundle branch block. Doppler evidence of grade I (impaired) diastolic dysfunction, indeterminate LAP. Calculated EF 20%. Left atrial cavity is mildly dilated. Mild (Grade I) mitral regurgitation. Mild tricuspid regurgitation. Estimated pulmonary artery systolic pressure 26  mmHg. No significant change compared to previous study on 05/02/2018.  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  RV: 46/6 mmHg. RVEDP 11 mmHg PA 56/28 mmHg, mean PAP 40 mmHg PW: 27 mmHg LV 137/0 mmHg with LVEDP 36 mmHg  CO 5.2 L/min CI 2.2 L/min/m2  10/17/2018: Glucose 88, BUN/Cr 14/1.08. EGFR 63. Na/K 139/4.6.  H/H 13.6/41.9. MCV 91. Platelets 388  12/2018: TSH 3.5 normal, free T4 0.78 normal Thyroperoxidase Ab 22 (elevated)   Review of Systems  Constitution: Negative for decreased appetite, malaise/fatigue, weight gain and weight loss.  HENT: Negative for congestion.   Eyes: Negative for visual disturbance.  Cardiovascular: Positive for dyspnea on exertion and leg swelling. Negative for chest pain, palpitations and syncope.  Respiratory: Negative for shortness of breath.   Endocrine: Negative for cold intolerance.  Hematologic/Lymphatic: Does not bruise/bleed easily.  Skin: Negative for itching and rash.  Musculoskeletal: Negative for myalgias.  Gastrointestinal: Negative for abdominal pain, nausea and vomiting.  Genitourinary: Negative for dysuria.  Neurological: Negative for dizziness and weakness.  Psychiatric/Behavioral: The patient is not nervous/anxious.   All other systems reviewed and are negative.      Objective:    Vitals:   10/09/19 1043  BP: 125/76  Pulse: 87      Physical Exam   Not performed. Telephone visit.      Assessment & Recommendations:   44 y/o Caucasian  female with  nonischemic dilated cardiomyopathy, now s/p BiVICD placement, Hashimoto thyroiditis, systemic sclerosis, Sjogren's disease, Raynaud's syndrome, morbid obesity, endometriosis, menorrhagia.  1. Chronic systolic heart failure ([place]):  Mild improvement in LVEF now at 30-35%. Nonischemic cardiomyopathy. NYHA class II-III symptoms. Continue Entresto 97-103 mg bid, metoprolol succinate to 50 mg daily,  spironolactone to 50 mg daily, corlanor to 7.5 mg bid, Farxiga 5 mg daily. Take lasix 20 mg daily, with additional dose as needed, for leg edema. Patient must check daily weights.  S/p Biv ICD. Encourage increased physical activity and weight loss.   2. Hypertension:     Controlled      Echocardiogram and f/u in June  Margerie Fraiser J Faiza Bansal, MD [facility] [place] Pager: [phone] Office: [phone] If no answer Cell [phone]

## 2019-10-22 ENCOUNTER — Encounter: Payer: Self-pay | Admitting: Neurology

## 2019-10-23 ENCOUNTER — Other Ambulatory Visit: Payer: Self-pay

## 2019-10-23 ENCOUNTER — Telehealth (INDEPENDENT_AMBULATORY_CARE_PROVIDER_SITE_OTHER): Payer: Medicaid Other | Admitting: Neurology

## 2019-10-23 VITALS — Ht 66.0 in | Wt 310.0 lb

## 2019-10-23 DIAGNOSIS — R202 Paresthesia of skin: Secondary | ICD-10-CM

## 2019-10-23 DIAGNOSIS — E639 Nutritional deficiency, unspecified: Secondary | ICD-10-CM | POA: Diagnosis not present

## 2019-10-23 MED ORDER — GABAPENTIN 300 MG PO CAPS: ORAL_CAPSULE | ORAL | 2 refills | Status: DC

## 2019-10-23 NOTE — Addendum Note (Signed)
Addended by: Judd Gaudier on: 10/23/2019 09:52 AM   Modules accepted: Orders

## 2019-10-23 NOTE — Progress Notes (Signed)
Virtual Visit via Video Note The purpose of this virtual visit is to provide medical care while limiting exposure to the novel coronavirus.    Consent was obtained for video visit:  Yes.   Answered questions that patient had about telehealth interaction:  Yes.   I discussed the limitations, risks, security and privacy concerns of performing an evaluation and management service by telemedicine. I also discussed with the patient that there may be a patient responsible charge related to this service. The patient expressed understanding and agreed to proceed.  Pt location: Home Physician Location: office Name of referring provider:  Aida Puffer, MD I connected with Darolyn Rua at patients initiation/request on 10/23/2019 at  8:50 AM EST by video enabled telemedicine application and verified that I am speaking with the correct person using two identifiers. Pt MRN:  [id number] Pt DOB:  1975/09/26 Video Participants:  Darolyn Rua   History of Present Illness: This is a 44 y.o. female with mixed connective tissue disease, Hashimoto's thyroiditis, Raynaud's syndrome, OSA, pulmonary hypertension, and heart failure returning for follow-up of generalized paresthesias.  She was last seen in 04/2018 for the same complains of numbness/tingling involving the hands and feet, as well as sharp needle-like pain in the legs and arms.  NCS/EMG showed left CTS (mild) and otherwise normal.  She is here with the same complaints.  There has been no change over the past year, except that she is having numbness/tingling over the tip of her nose.  She gets relief with gabapentin 300mg  TID.  No weakness or imbalance.  She walks unassisted.    Observations/Objective:   Vitals:   10/22/19 0835  Weight: (!) 310 lb (140.6 kg)  Height: 5\' 6"  (1.676 m)   Patient is awake, alert, and appears comfortable.  Oriented x 4.   Extraocular muscles are intact. No ptosis.  Face is symmetric.  Speech is not dysarthric. Tongue is  midline. Antigravity in all extremities.  Finger tapping intact. Gait appears normal.  She is able to stand on heels and toes.   DATA: NCS/EMG of the arms 05/14/2018: 1. Left median neuropathy at or distal to the wrist, consistent with a clinical diagnosis of carpal tunnel syndrome.  Overall, these findings are mild in degree electrically. 2. There is no evidence of a sensorimotor polyneuropathy or cervical radiculopathy affecting the upper extremities.  NCS/EMG of the legs 05/09/2018: This is a normal study of the lower extremities.  In particular, there is no evidence of a large fiber sensorimotor polyneuropathy or lumbosacral radiculopathy.    Lab Results  Component Value Date   VITAMINB12 379 04/26/2018     Assessment and Plan:  Migratory paresthesias, nonspecific.  Prior evaluation has included NCS/EMG of the arms and legs which did not show neuropathy or cervical/lumbosacral radiculopathy.  There is mild left CTS, but this would not explain her widespread paresthesias.  Fortunately, she is experiencing relief with gabapentin which will be continued.  Check vitamin B12 Continue gabapentin 300mg  three times daily - refills provided If symptoms progress, consider referral to academic center for their opinion   Follow Up Instructions:   I discussed the assessment and treatment plan with the patient. The patient was provided an opportunity to ask questions and all were answered. The patient agreed with the plan and demonstrated an understanding of the instructions.   The patient was advised to call back or seek an in-person evaluation if the symptoms worsen or if the condition fails to improve as anticipated.  Yuli Lanigan  Keith Rake, DO

## 2019-10-23 NOTE — Progress Notes (Signed)
B12 lab ordered and message sent to patient on directions for the labwork.

## 2019-10-24 ENCOUNTER — Other Ambulatory Visit (INDEPENDENT_AMBULATORY_CARE_PROVIDER_SITE_OTHER): Payer: Medicaid Other

## 2019-10-24 DIAGNOSIS — R202 Paresthesia of skin: Secondary | ICD-10-CM

## 2019-10-24 DIAGNOSIS — E639 Nutritional deficiency, unspecified: Secondary | ICD-10-CM

## 2019-10-25 LAB — VITAMIN B12: Vitamin B-12: 580 pg/mL (ref 200–1100)

## 2019-10-27 ENCOUNTER — Other Ambulatory Visit: Payer: Self-pay | Admitting: Cardiology

## 2019-11-10 ENCOUNTER — Ambulatory Visit (INDEPENDENT_AMBULATORY_CARE_PROVIDER_SITE_OTHER): Payer: Medicaid Other | Admitting: *Deleted

## 2019-11-10 DIAGNOSIS — I429 Cardiomyopathy, unspecified: Secondary | ICD-10-CM

## 2019-11-10 LAB — CUP PACEART REMOTE DEVICE CHECK
Battery Remaining Longevity: 82 mo
Battery Remaining Percentage: 87 %
Battery Voltage: 3.05 V
Brady Statistic AP VP Percent: 5.7 %
Brady Statistic AP VS Percent: 1 %
Brady Statistic AS VP Percent: 93 %
Brady Statistic AS VS Percent: 1 %
Brady Statistic RA Percent Paced: 6.5 %
Date Time Interrogation Session: 20210322020018
HighPow Impedance: 84 Ohm
HighPow Impedance: 84 Ohm
Implantable Lead Implant Date: 20200306
Implantable Lead Implant Date: 20200306
Implantable Lead Implant Date: 20200306
Implantable Lead Location: 753858
Implantable Lead Location: 753859
Implantable Lead Location: 753860
Implantable Lead Model: 7122
Implantable Pulse Generator Implant Date: 20200306
Lead Channel Impedance Value: 1250 Ohm
Lead Channel Impedance Value: 510 Ohm
Lead Channel Impedance Value: 540 Ohm
Lead Channel Pacing Threshold Amplitude: 0.5 V
Lead Channel Pacing Threshold Amplitude: 0.625 V
Lead Channel Pacing Threshold Amplitude: 1.125 V
Lead Channel Pacing Threshold Pulse Width: 0.5 ms
Lead Channel Pacing Threshold Pulse Width: 0.5 ms
Lead Channel Pacing Threshold Pulse Width: 0.5 ms
Lead Channel Sensing Intrinsic Amplitude: 11.9 mV
Lead Channel Sensing Intrinsic Amplitude: 5 mV
Lead Channel Setting Pacing Amplitude: 1.625
Lead Channel Setting Pacing Amplitude: 2 V
Lead Channel Setting Pacing Amplitude: 2.125
Lead Channel Setting Pacing Pulse Width: 0.5 ms
Lead Channel Setting Pacing Pulse Width: 0.5 ms
Lead Channel Setting Sensing Sensitivity: 0.5 mV
Pulse Gen Serial Number: 9878925

## 2019-11-11 NOTE — Progress Notes (Signed)
ICD Remote  

## 2019-11-13 ENCOUNTER — Ambulatory Visit: Payer: Medicaid Other

## 2019-12-01 ENCOUNTER — Ambulatory Visit: Payer: Medicaid Other

## 2019-12-04 IMAGING — CR DG KNEE COMPLETE 4+V*L*
4 series · 4 of 4 positions shown · non-contrast
Comparison: None.

CLINICAL DATA: 42-year-old female status post twisting injury today
with left knee pain.

EXAM:
LEFT KNEE - COMPLETE 4+ VIEW

[knee ap]
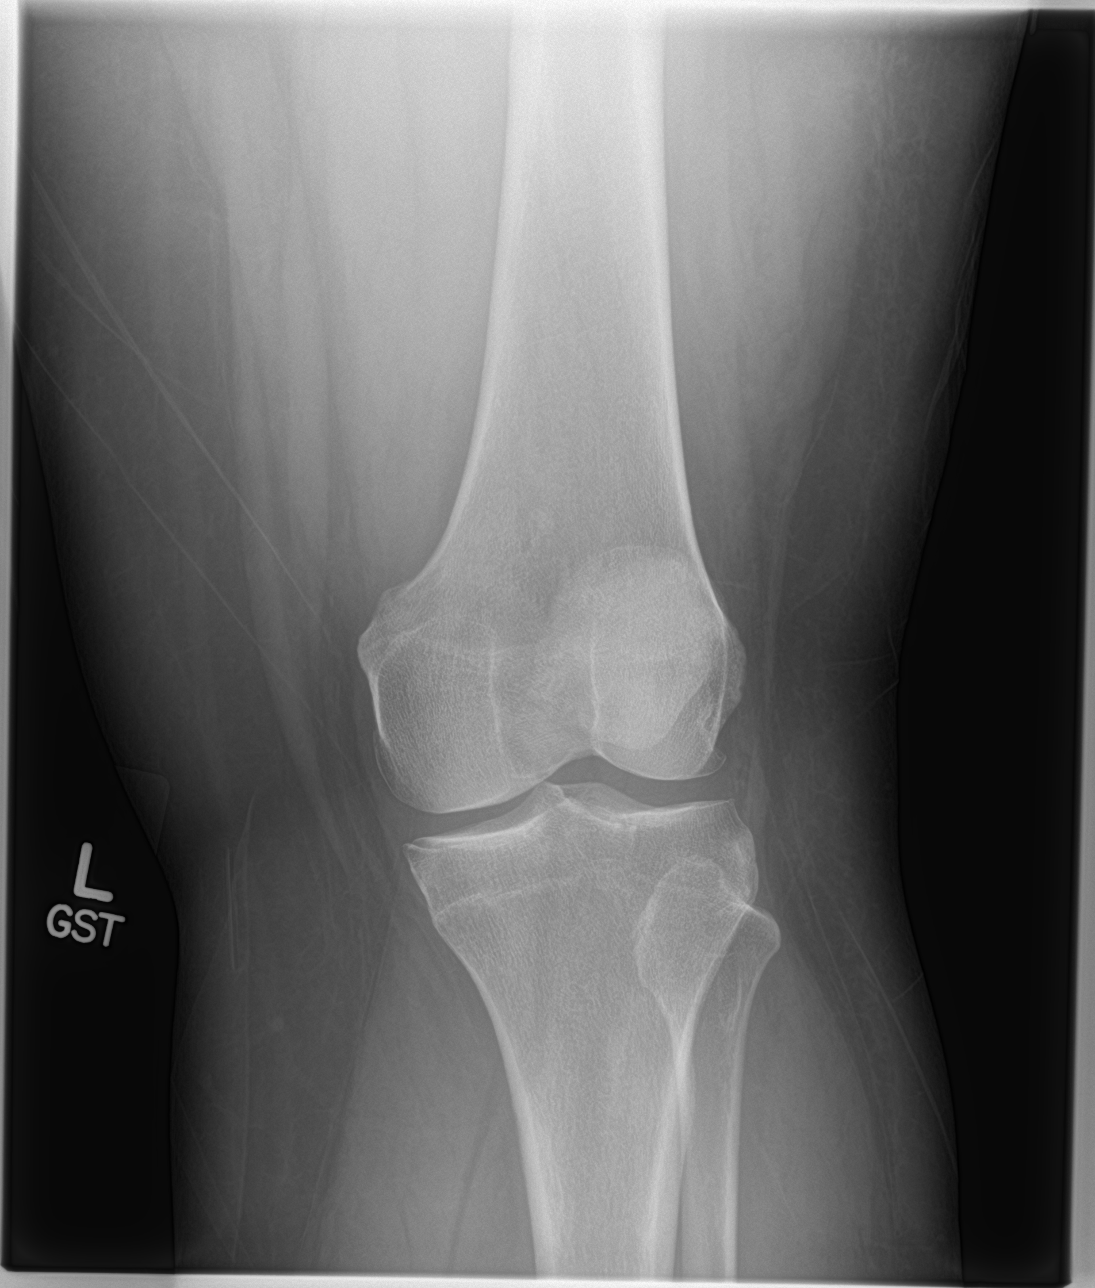

[knee lat]
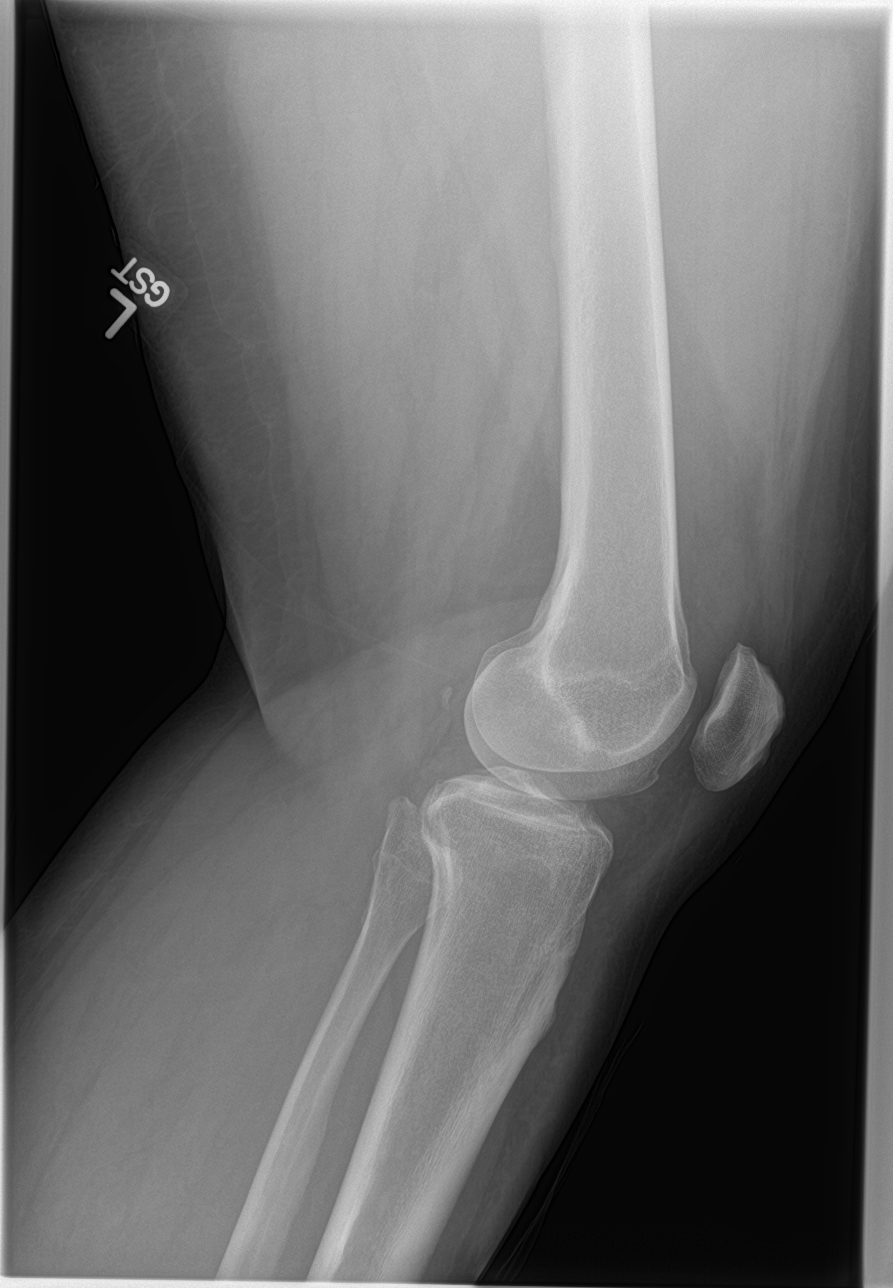

[knee obl (1 of 2)]
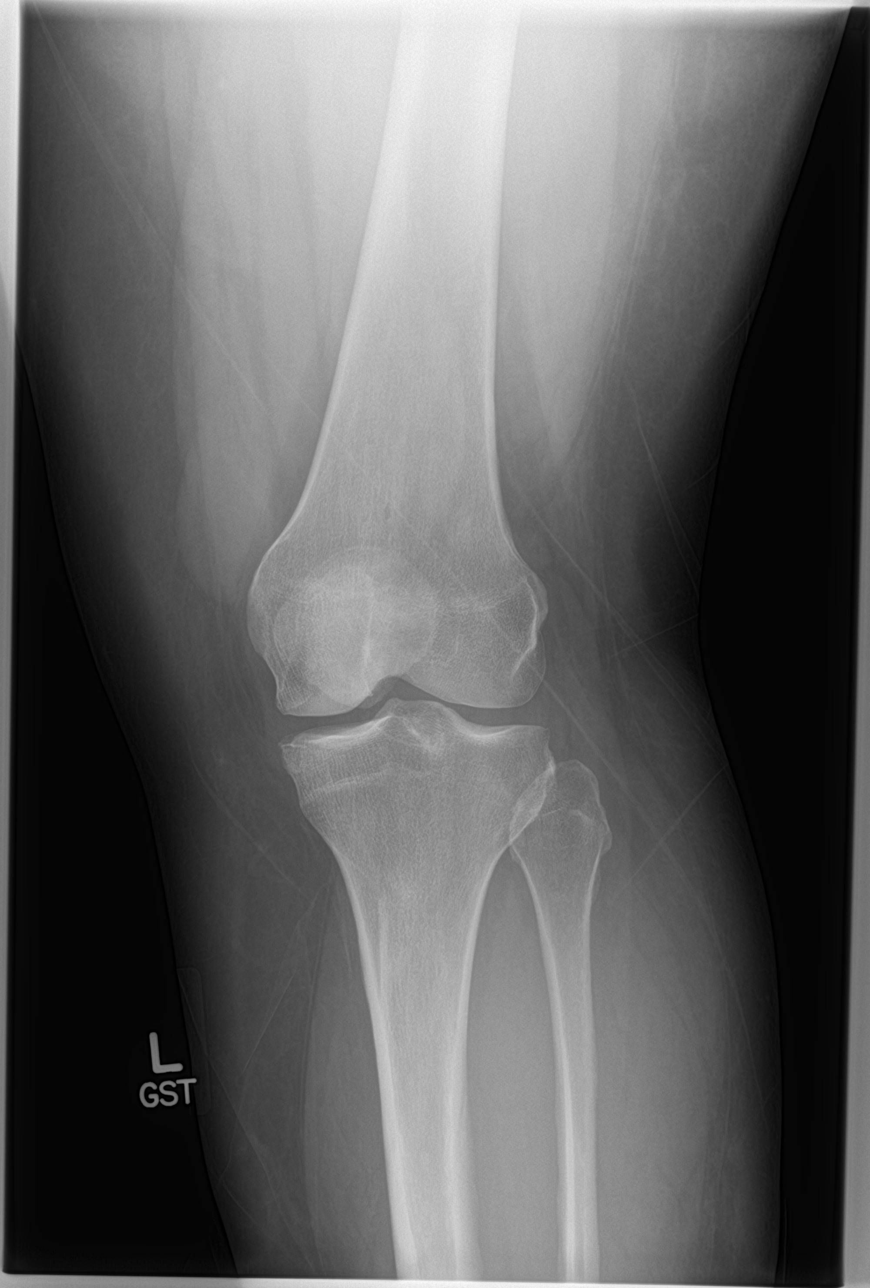

[knee obl (2 of 2)]
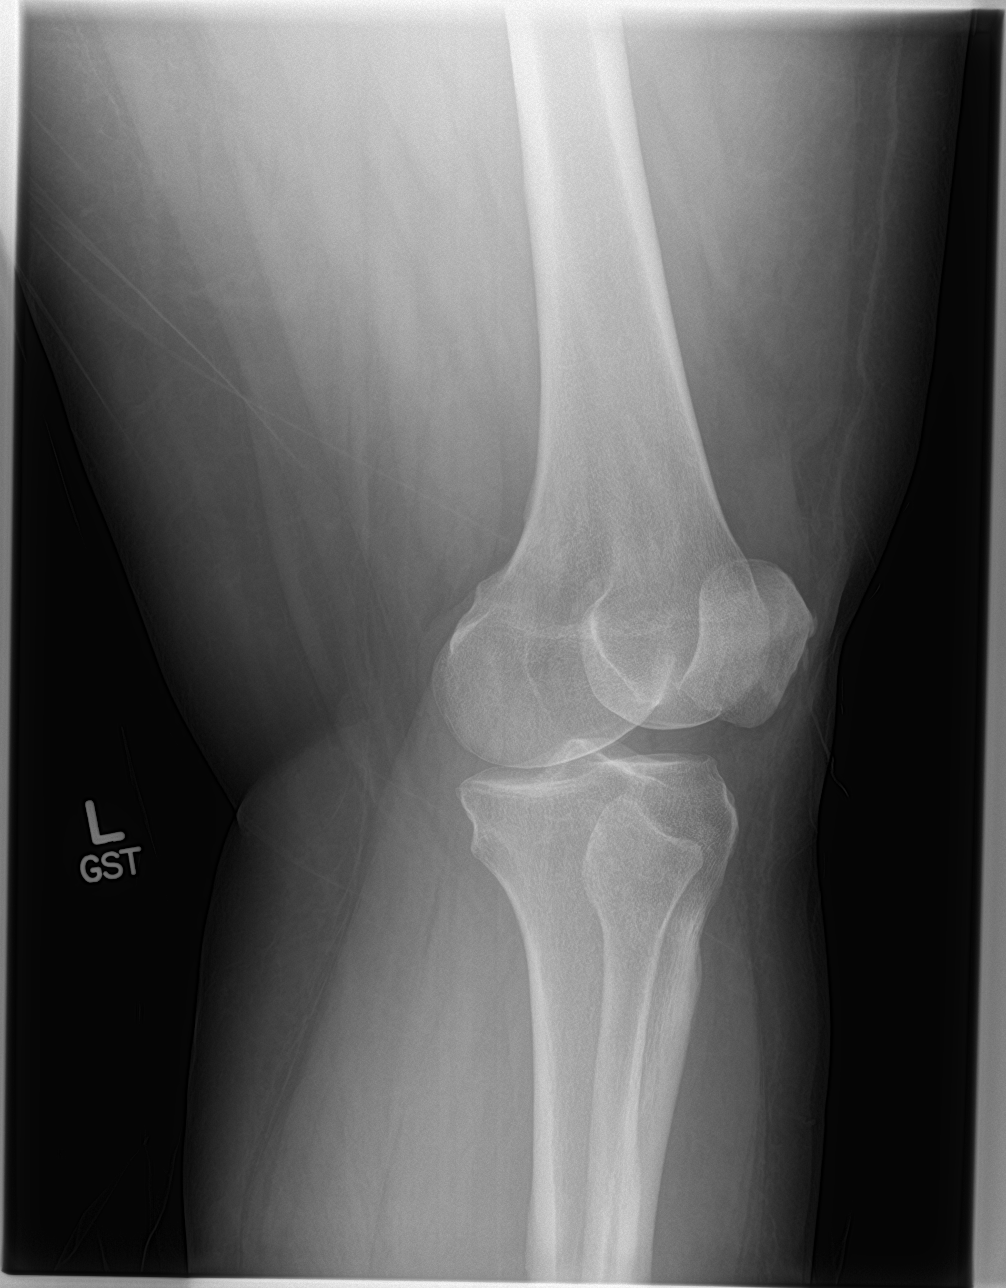

[4 of 4 positions shown; findings below may reference images not displayed]

FINDINGS: No joint effusion. Patella appears intact. Medial and lateral
compartment degenerative spurring with relatively preserved joint
spaces. No acute osseous abnormality identified. Bone mineralization
is within normal limits.
IMPRESSION: No joint effusion or acute osseous abnormality identified about the
left knee.

## 2019-12-12 ENCOUNTER — Other Ambulatory Visit: Payer: Self-pay | Admitting: Cardiology

## 2019-12-25 ENCOUNTER — Other Ambulatory Visit: Payer: Self-pay

## 2019-12-29 ENCOUNTER — Encounter: Payer: Self-pay | Admitting: Internal Medicine

## 2019-12-29 ENCOUNTER — Ambulatory Visit (INDEPENDENT_AMBULATORY_CARE_PROVIDER_SITE_OTHER): Payer: Medicaid Other | Admitting: Internal Medicine

## 2019-12-29 DIAGNOSIS — Z5329 Procedure and treatment not carried out because of patient's decision for other reasons: Secondary | ICD-10-CM

## 2019-12-29 NOTE — Progress Notes (Signed)
Patient ID: Sarah English, female   DOB: 1975/09/26, 44 y.o.   MRN: [id number]  Cancelled 1h before appt  HPI  Sarah English is a 44 y.o.-year-old female, returning for f/u for Hashimoto's thyroiditis, thyroid nodule.  Last visit 1 year ago.  Before last visit, she had a meniscus tear in the left leg so she was quite immobile.  She gained weight, but she was able to lose some of it since then.  Reviewed history: Pt. has been dx with thyroiditis based on the aspect of the thyroid at the time of a recent thyroid U/S: heterogenous aspect, c/w thyroiditis.   Afterwards, antithyroid antibodies also returned elevated.  Reviewed her TFTs: Lab Results  Component Value Date   TSH 3.54 12/27/2018   TSH 3.51 01/28/2018   TSH 0.21 (L) 03/30/2015   TSH 5.423 (H) 07/30/2014   TSH 0.39 03/26/2014   FREET4 0.78 12/27/2018   FREET4 0.73 01/28/2018   FREET4 1.02 03/30/2015   FREET4 0.68 (L) 07/30/2014   FREET4 0.46 (L) 03/26/2014   Her TPO antibodies were elevated: Component     Latest Ref Rng & Units 11/11/2013 01/28/2018 12/27/2018  Thyroperoxidase Ab SerPl-aCnc     <9 IU/mL 74.0 (H) 26 (H) 22 (H)   In the past, she was taking Tri-iodine and Tyrosine (started 1.5 mo prior). We stopped these.  As the TSH was slightly high in 07/2014, we started her on levothyroxine, but subsequent TSH is slightly suppressed.  We had to stop levothyroxine.  She has been now off levothyroxine for more than 4 years.   We started selenium 200 mcg daily in 2019.  She feels better on it.  Pt denies: - feeling nodules in neck - hoarseness - dysphagia - choking - SOB with lying down  Reviewed thyroid ultrasound report from 04/15/2013: Right thyroid lobe:  4.5 x 2.1 x 1.6 cm Left thyroid lobe:  4.5 x 1.6 x 2.1 cm Isthmus:  6 mm  Focal nodules:  Thyroid echotexture is diffusely heterogeneous. Thyroid morphology is diffusely lobular.  5 mm hyperechoic focus in the interpolar left lobe on image 24.  Otherwise, no  measurable focal solid or cystic nodules seen.  Diffusely increased vascularity.  Lymphadenopathy:  None visualized.  IMPRESSION: Lobular thyroid diffusely with heterogeneous echotexture and suggestion of increased vascularity.  Appearance is nonspecific and could be related to thyroiditis or its sequela.  No FH of thyroid cancer. No h/o radiation tx to head or neck.  No seaweed or kelp. No recent contrast studies. No herbal supplements. No Biotin use. No recent steroids use.   She also has scleroderma, Sjogren's syndrome, fibromyalgia, vitamin D deficiency -all managed by her rheumatologist.   She had a MRI, then lumbar puncture performed for R leg weakness with falls >> no MS.  In 2019, she was diagnosed with LBBB + NICMP (EF reduced), also pulmonary HTN per review of the results of her recent catheterization.  On Entresto and Spironolactone. She had a pacemaker and a defibrillator placed. She had cardiac rehab.  She is a single mom.  ROS: Constitutional: no weight gain/no weight loss, no fatigue, no subjective hyperthermia, no subjective hypothermia Eyes: no blurry vision, no xerophthalmia ENT: no sore throat, + see HPI Cardiovascular: no CP/no SOB/no palpitations/no leg swelling Respiratory: no cough/no SOB/no wheezing Gastrointestinal: no N/no V/no D/no C/no acid reflux Musculoskeletal: no muscle aches/no joint aches Skin: no rashes, no hair loss Neurological: no tremors/no numbness/no tingling/no dizziness  I reviewed pt's medications, allergies, PMH, social hx,  family hx, and changes were documented in the history of present illness. Otherwise, unchanged from my initial visit note.  PE: There were no vitals taken for this visit. There is no height or weight on file to calculate BMI. Wt Readings from Last 3 Encounters:  10/22/19 (!) 310 lb (140.6 kg)  07/03/19 (!) 305 lb (138.3 kg)  04/16/19 (!) 319 lb (144.7 kg)   Constitutional: overweight, in NAD Eyes: PERRLA,  EOMI, no exophthalmos ENT: moist mucous membranes, no thyromegaly, no cervical lymphadenopathy Cardiovascular: RRR, No MRG Respiratory: CTA B Gastrointestinal: abdomen soft, NT, ND, BS+ Musculoskeletal: no deformities, strength intact in all 4 Skin: moist, warm, no rashes Neurological: no tremor with outstretched hands, DTR normal in all 4  ASSESSMENT: 1. Hashimoto's thyroiditis  2.  Thyroid nodule  PLAN:  1. Patient with history of Hashimoto's thyroiditis, with mildly elevated TSH in the past, after which we started a low-dose levothyroxine, 25 mcg daily.  However, after we started this, her TSH was slightly low so we had to stop levothyroxine.  She has been off medication for more than 4 years.  At last visit, TFTs were normal and TPO antibodies are slightly elevated.  She continues on selenium 200 mcg daily, used to decrease her antibody levels. -At this visit, she is asymptomatic, without hypothyroid symptoms -We will recheck her TFTs today along with TPO antibodies.  We will see if we need to restart a low-dose levothyroxine. -She is aware about correct intake of levothyroxine if we need to restart: Every day, with water, separated by at least 30 minutes from breakfast, and more than 4 hours from acid reflux medications, calcium, iron, multivitamins. -If labs are normal, I will see her back in a year  2.  Thyroid nodule -at last OV, she occasionally felt a lump in her neck and we discussed that this is possibly due to inflammation and increased blood flow due to Hashimoto's thyroiditis.  We also kept her on selenium to decrease thyroid inflammation. -Latest thyroid ultrasound from 2014 showed a 5 mm thyroid nodule (hyperechogenic focus), not worrisome.  No further investigation is needed for this.   Philemon Kingdom, MD PhD [facility]

## 2020-01-12 ENCOUNTER — Other Ambulatory Visit: Payer: Self-pay

## 2020-01-13 ENCOUNTER — Other Ambulatory Visit: Payer: Self-pay | Admitting: Cardiology

## 2020-01-13 DIAGNOSIS — I428 Other cardiomyopathies: Secondary | ICD-10-CM

## 2020-01-13 DIAGNOSIS — I5022 Chronic systolic (congestive) heart failure: Secondary | ICD-10-CM

## 2020-01-14 ENCOUNTER — Other Ambulatory Visit: Payer: Self-pay

## 2020-01-14 ENCOUNTER — Encounter: Payer: Self-pay | Admitting: Internal Medicine

## 2020-01-14 ENCOUNTER — Ambulatory Visit: Payer: Medicaid Other | Admitting: Internal Medicine

## 2020-01-14 VITALS — BP 118/70 | HR 74 | Ht 65.0 in | Wt 320.0 lb

## 2020-01-14 DIAGNOSIS — E041 Nontoxic single thyroid nodule: Secondary | ICD-10-CM

## 2020-01-14 DIAGNOSIS — E063 Autoimmune thyroiditis: Secondary | ICD-10-CM | POA: Diagnosis not present

## 2020-01-14 LAB — TSH: TSH: 2.52 u[IU]/mL (ref 0.35–4.50)

## 2020-01-14 LAB — T3, FREE: T3, Free: 3.1 pg/mL (ref 2.3–4.2)

## 2020-01-14 LAB — T4, FREE: Free T4: 0.69 ng/dL (ref 0.60–1.60)

## 2020-01-14 NOTE — Patient Instructions (Signed)
Please stop at the lab.  Continue Selenium 200 mcg daily.  Please come back for a follow-up appointment in 1 year.  

## 2020-01-14 NOTE — Progress Notes (Signed)
Patient ID: Sarah English, female   DOB: 1976/03/06, 44 y.o.   MRN: [id number]  This visit occurred during the SARS-CoV-2 public health emergency.  Safety protocols were in place, including screening questions prior to the visit, additional usage of staff PPE, and extensive cleaning of exam room while observing appropriate contact time as indicated for disinfecting solutions.   HPI  Sarah English is a 44 y.o.-year-old female, returning for f/u for Hashimoto's thyroiditis, thyroid nodule.  Last visit 1 year ago.  She has a UTI and a sinus infection. Now on Bactrim + will start a Prednisone taper today.  She recently started to have more pressure in her lower neck.  Reviewed history: Pt. has been dx with thyroiditis based on the aspect of the thyroid at the time of a recent thyroid U/S: heterogenous aspect, c/w thyroiditis.   Afterwards, antithyroid antibodies also returned elevated.  Reviewed her TFTs Lab Results  Component Value Date   TSH 3.54 12/27/2018   TSH 3.51 01/28/2018   TSH 0.21 (L) 03/30/2015   TSH 5.423 (H) 07/30/2014   TSH 0.39 03/26/2014   FREET4 0.78 12/27/2018   FREET4 0.73 01/28/2018   FREET4 1.02 03/30/2015   FREET4 0.68 (L) 07/30/2014   FREET4 0.46 (L) 03/26/2014   Her TPO antibodies were elevated: Component     Latest Ref Rng & Units 11/11/2013 01/28/2018 12/27/2018  Thyroperoxidase Ab SerPl-aCnc     <9 IU/mL 74.0 (H) 26 (H) 22 (H)   In the past, she was taking Tri-iodine and Tyrosine (started 1.5 mo prior).  We stopped these.  As the TSH was slightly high in 07/2014, we started her on levothyroxine, but subsequent TSH is slightly suppressed.  We had to stop levothyroxine then.  She has now been off levothyroxine for approximately 5 years.   We started selenium 200 mcg daily in 2019.  She feels better on it.  Pt denies: - feeling nodules in neck - hoarseness - dysphagia - choking - SOB with lying down  Reviewed the report of the thyroid ultrasound report  from 04/15/2013: Right thyroid lobe:  4.5 x 2.1 x 1.6 cm Left thyroid lobe:  4.5 x 1.6 x 2.1 cm Isthmus:  6 mm  Focal nodules:  Thyroid echotexture is diffusely heterogeneous. Thyroid morphology is diffusely lobular.  5 mm hyperechoic focus in the interpolar left lobe on image 24.  Otherwise, no measurable focal solid or cystic nodules seen.  Diffusely increased vascularity.  Lymphadenopathy:  None visualized.  IMPRESSION: Lobular thyroid diffusely with heterogeneous echotexture and suggestion of increased vascularity.  Appearance is nonspecific and could be related to thyroiditis or its sequela.  No FH of thyroid cancer. No h/o radiation tx to head or neck.  No recent contrast studies. No herbal supplements. No Biotin use. No recent steroids use.   She also has scleroderma, Sjogren's syndrome, fibromyalgia, vitamin D deficiency (2000 IU daily)-all managed by her rheumatologist.  She had a MRI, then lumbar puncture performed for R leg weakness with falls >> no MS.  In 2019, she was diagnosed with LBBB + NICMP (EF reduced, 30%), also pulmonary HTN per review of the results of her recent catheterization.  On Entresto and Spironolactone. She had a pacemaker and a defibrillator placed. She had cardiac rehab.  She is a single mom.  ROS: Constitutional: no weight gain/no weight loss, + fatigue, no subjective hyperthermia, no subjective hypothermia Eyes: no blurry vision, no xerophthalmia ENT: no sore throat, + see HPI Cardiovascular: no CP/no SOB/no palpitations/no leg  swelling Respiratory: no cough/no SOB/no wheezing Gastrointestinal: no N/no V/+ D/+ C/no acid reflux Musculoskeletal: no muscle aches/+ joint aches Skin: no rashes, no hair loss Neurological: no tremors/no numbness/no tingling/no dizziness  I reviewed pt's medications, allergies, PMH, social hx, family hx, and changes were documented in the history of present illness. Otherwise, unchanged from my initial visit  note.  Past Medical History:  Diagnosis Date  . Anemia   . Autoimmune disorder (HCC)   . Bronchitis    hx -uses inhaler prn  . Chronic kidney disease    kidney infections  . Endometriosis   . Fibromyalgia   . GERD (gastroesophageal reflux disease)    occasional  . Hashimoto thyroiditis, fibrous variant   . Hashimoto's disease   . Headache    migraines  . History of kidney stones   . Hypertension   . IVCD (intraventricular conduction defect)   . LBBB (left bundle branch block)   . Nonischemic cardiomyopathy (HCC)    per stress test 12-17-2017  ef 14%;  per echo 12-13-2017 ef 21%  . Peripheral vascular disease (HCC)    raynoids syndrome  . Pneumonia    only x 1 - 2- 3 months ago  . 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 Scleroderma (HCC)   . Sjogren's syndrome (HCC)   . Sleep apnea    recently dx 04/13/18, does not have CPAP machine yet.  . Systemic sclerosis (HCC)   . 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 Tendinitis    lower back  . Thyroiditis    Past Surgical History:  Procedure Laterality Date  . BIV ICD INSERTION CRT-D N/A 10/25/2018   Procedure: BIV ICD INSERTION CRT-D;  Surgeon: Marinus Maw, MD;  Location: [facility] INVASIVE CV LAB;  Service: Cardiovascular;  Laterality: N/A;  . CESAREAN SECTION  2004   x 1  . CHOLECYSTECTOMY    . COLONOSCOPY    . CYSTOSCOPY     x 2 - bladder as a child  . HYSTEROSCOPY WITH D & C N/A 04/16/2018   Procedure: DILATATION AND CURETTAGE /HYSTEROSCOPY;  Surgeon: Lavina Hamman, MD;  Location: [facility] ORS;  Service: Gynecology;  Laterality: N/A;  . LAPAROSCOPIC LYSIS OF ADHESIONS  04/16/2018   Procedure: LAPAROSCOPIC LYSIS OF ADHESIONS;  Surgeon: Lavina Hamman, MD;  Location: [facility] ORS;  Service: Gynecology;;  . MANDIBLE FRACTURE SURGERY  1994   upper mandibular  . RIGHT/LEFT HEART CATH AND CORONARY ANGIOGRAPHY N/A  12/18/2017   Procedure: RIGHT/LEFT HEART CATH AND CORONARY ANGIOGRAPHY;  Surgeon: Elder Negus, MD;  Location: [facility] INVASIVE CV LAB;  Service: Cardiovascular;  Laterality: N/A;  . UPPER GI ENDOSCOPY    . WISDOM TOOTH EXTRACTION     Social History   Socioeconomic History  . Marital status: Single    Spouse name: Not on file  . Number of children: 1  . Years of education: 62  . Highest education level: Associate degree: occupational, Scientist, product/process development, or vocational program  Occupational History  . Occupation: not working  Tobacco Use  . Smoking status: Former Smoker    Packs/day: 0.50    Years: 8.00    Pack years: 4.00    Types: Cigarettes    Quit date: 12/10/2012    Years since quitting: 7.0  . Smokeless tobacco: Never Used  Substance and Sexual Activity  . Alcohol use: Yes  Comment: once a year  . Drug use: No  . Sexual activity: Yes    Birth control/protection: Pill  Other Topics Concern  . Not on file  Social History Narrative   Lives with son, renting a room from her mother.  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     Education: college.    Social Determinants of Health   Financial Resource Strain:   . Difficulty of Paying Living Expenses:   Food Insecurity:   . Worried About Charity fundraiser in the Last Year:   . Arboriculturist in the Last Year:   Transportation Needs:   . Film/video editor (Medical):   Marland Kitchen Lack of Transportation (Non-Medical):   Physical Activity:   . Days of Exercise per Week:   . Minutes of Exercise per Session:   Stress:   . Feeling of Stress :   Social Connections:   . Frequency of Communication with Friends and Family:   . Frequency of Social Gatherings with Friends and Family:   . Attends Religious Services:   . Active Member of Clubs or Organizations:   . Attends Archivist Meetings:   Marland Kitchen Marital Status:   Intimate Partner Violence:   . Fear of Current or Ex-Partner:   .  Emotionally Abused:   Marland Kitchen Physically Abused:   . Sexually Abused:    Current Outpatient Medications on File Prior to Visit  Medication Sig Dispense Refill  . albuterol (PROVENTIL HFA;VENTOLIN HFA) 108 (90 Base) MCG/ACT inhaler Inhale into the lungs every 6 (six) hours as needed for wheezing or shortness of breath.    Marland Kitchen antiseptic oral rinse (BIOTENE) LIQD 1-2 application by Mouth Rinse route every hour as needed for dry mouth.    . CORLANOR 7.5 MG TABS tablet TAKE 1 TABLET BY MOUTH TWICE A DAY WITH MEALS 60 tablet 3  . cyclobenzaprine (FLEXERIL) 10 MG tablet Take 10 mg by mouth 3 (three) times daily as needed for muscle spasms.    . cycloSPORINE (RESTASIS) 0.05 % ophthalmic emulsion Place 1 drop into both eyes 2 (two) times daily.     Marland Kitchen etonogestrel-ethinyl estradiol (NUVARING) 0.12-0.015 MG/24HR vaginal ring Place 1 each vaginally every 28 (twenty-eight) days. Insert vaginally and leave in place for 4 consecutive weeks    . FARXIGA 5 MG TABS tablet TAKE 1 TABLET BY MOUTH DAILY. 30 tablet 3  . furosemide (LASIX) 20 MG tablet TAKE 1 TABLET BY MOUTH AS NEEDED 30 tablet 2  . gabapentin (NEURONTIN) 300 MG capsule TAKE 1 CAPSULE BY MOUTH THREE TIMES A DAY 270 capsule 2  . Lactobacillus Rhamnosus, GG, (CULTURELLE PROBIOTICS KIDS PO) Take 1 tablet by mouth daily.    . Magnesium 200 MG TABS Take 1 tablet by mouth daily.    . metoprolol succinate (TOPROL-XL) 50 MG 24 hr tablet TAKE 1 TABLET BY MOUTH EVERY DAY 90 tablet 0  . Multiple Vitamin (MULTIVITAMIN) tablet Take 1 tablet by mouth daily.    . pantoprazole (PROTONIX) 40 MG tablet Take 40 mg by mouth daily.    Marland Kitchen PAZEO 0.7 % SOLN Place 1 drop into both eyes daily.    . sacubitril-valsartan (ENTRESTO) 97-103 MG Take 1 tablet by mouth 2 (two) times daily. 180 tablet 1  . saline (AYR) GEL Place 1 application into the nose daily.    Marland Kitchen selenium (SELENIMIN-200) 200 MCG TABS tablet Take 200 mcg by mouth daily.     Marland Kitchen  silver sulfADIAZINE (SILVADENE) 1 % cream  Apply 1 application topically 2 (two) times daily as needed (wounds/affected area(s) of skin.).     Marland Kitchen spironolactone (ALDACTONE) 50 MG tablet TAKE 1 TABLET BY MOUTH EVERY DAY 30 tablet 3  . SUMAtriptan (IMITREX) 100 MG tablet Take 100 mg by mouth every 6 (six) hours as needed for migraine. May repeat in 2 hours if headache persists or recurs.    . traMADol (ULTRAM) 50 MG tablet Take 1 tablet (50 mg total) by mouth every 6 (six) hours as needed. 8 tablet 0   No current facility-administered medications on file prior to visit.   Allergies  Allergen Reactions  . Sulfa Antibiotics Nausea And Vomiting   Family History  Problem Relation Age of Onset  . Diabetes Mother        Type II  . Heart disease Mother        stent surgery  . Hypertension Mother   . Hyperlipidemia Mother   . Diabetes Father        Type II  . Hypertension Father   . Hyperlipidemia Father     PE: BP 118/70   Pulse 74   Ht 5\' 5"  (1.651 m)   Wt (!) 320 lb (145.2 kg)   SpO2 95%   BMI 53.25 kg/m  Body mass index is 53.25 kg/m. Wt Readings from Last 3 Encounters:  01/14/20 (!) 320 lb (145.2 kg)  10/22/19 (!) 310 lb (140.6 kg)  07/03/19 (!) 305 lb (138.3 kg)   Constitutional: overweight, in NAD Eyes: PERRLA, EOMI, no exophthalmos ENT: moist mucous membranes, no thyromegaly, no cervical lymphadenopathy Cardiovascular: RRR, No MRG Respiratory: CTA B Gastrointestinal: abdomen soft, NT, ND, BS+ Musculoskeletal: no deformities, strength intact in all 4 Skin: moist, warm, no rashes Neurological: no tremor with outstretched hands, DTR normal in all 4  ASSESSMENT: 1. Hashimoto's thyroiditis  2.  Thyroid nodule  PLAN:  1. Patient with history of Hashimoto's thyroiditis, with mildly elevated TSH in the past, after which we started a low-dose levothyroxine, 25 mcg daily.  However, after this, her TSH was slightly low so we stopped levothyroxine.  She has been off medication for more than 4 years.  At last visit,  TFTs were normal and TPO antibodies were slightly elevated.  She continues on selenium 200 mcg daily, used to help with decreasing her thyroid antibodies. -She is asymptomatic, without hypothyroid symptoms, but she does mention more pressure in her neck. I believe that this is most likely associated with increased inflammation from Hashimoto's thyroiditis. -At today's visit we will recheck her TFTs along with TPO antibodies.  We will see if we need to start a low-dose levothyroxine. -She is aware about correct intake of levothyroxine, if we need to restart:  Take the thyroid hormone every day, with water, at least 30 minutes before breakfast, separated by at least 4 hours from: - acid reflux medications - calcium - iron - multivitamins -If the labs are normal, we will see her back in a year  2.  Thyroid nodule -At last office visit, she occasionally felt a lump in her neck and we discussed that this is possibly due to inflammation and increased blood flow due to Hashimoto's thyroiditis.  We also kept her on selenium to decrease thyroid inflammation. -Latest thyroid ultrasound from 2014 showed a 5 mm thyroid nodule (hyperechogenic focus), not worrisome.  No further investigation is needed for this -At this visit, she has more pressure in her neck, but I do  not feel abnormalities on palpation. I reassured the patient that this is most likely related to Hashimoto's thyroiditis inflammation. We did discuss about natural anti-inflammatory supplements, beyond selenium.  Component     Latest Ref Rng & Units 01/14/2020  TSH     0.35 - 4.50 uIU/mL 2.52  T4,Free(Direct)     0.60 - 1.60 ng/dL 4.56  Thyroperoxidase Ab SerPl-aCnc     <9 IU/mL 13 (H)  Triiodothyronine,Free,Serum     2.3 - 4.2 pg/mL 3.1   TPO antibodies have improved.  TFTs are normal.  Carlus Pavlov, MD PhD [facility]

## 2020-01-15 LAB — THYROID PEROXIDASE ANTIBODY: Thyroperoxidase Ab SerPl-aCnc: 13 IU/mL — ABNORMAL HIGH (ref <9)

## 2020-01-26 ENCOUNTER — Other Ambulatory Visit: Payer: Self-pay

## 2020-01-26 DIAGNOSIS — I5022 Chronic systolic (congestive) heart failure: Secondary | ICD-10-CM

## 2020-01-26 DIAGNOSIS — I428 Other cardiomyopathies: Secondary | ICD-10-CM

## 2020-01-26 MED ORDER — SPIRONOLACTONE 50 MG PO TABS: 50.0000 mg | ORAL_TABLET | Freq: Every day | ORAL | 3 refills | Status: DC

## 2020-01-26 MED ORDER — DAPAGLIFLOZIN PROPANEDIOL 5 MG PO TABS: 5.0000 mg | ORAL_TABLET | Freq: Every day | ORAL | 1 refills | Status: DC

## 2020-02-03 ENCOUNTER — Ambulatory Visit (INDEPENDENT_AMBULATORY_CARE_PROVIDER_SITE_OTHER): Payer: Medicaid Other

## 2020-02-03 ENCOUNTER — Other Ambulatory Visit: Payer: Self-pay

## 2020-02-03 DIAGNOSIS — I428 Other cardiomyopathies: Secondary | ICD-10-CM

## 2020-02-09 ENCOUNTER — Ambulatory Visit (INDEPENDENT_AMBULATORY_CARE_PROVIDER_SITE_OTHER): Payer: Medicaid Other | Admitting: *Deleted

## 2020-02-09 DIAGNOSIS — I428 Other cardiomyopathies: Secondary | ICD-10-CM

## 2020-02-09 LAB — CUP PACEART REMOTE DEVICE CHECK
Battery Remaining Longevity: 77 mo
Battery Remaining Percentage: 84 %
Battery Voltage: 3.04 V
Brady Statistic AP VP Percent: 8.4 %
Brady Statistic AP VS Percent: 1 %
Brady Statistic AS VP Percent: 90 %
Brady Statistic AS VS Percent: 1 %
Brady Statistic RA Percent Paced: 9.2 %
Date Time Interrogation Session: 20210621032328
HighPow Impedance: 71 Ohm
HighPow Impedance: 71 Ohm
Implantable Lead Implant Date: 20200306
Implantable Lead Implant Date: 20200306
Implantable Lead Implant Date: 20200306
Implantable Lead Location: 753858
Implantable Lead Location: 753859
Implantable Lead Location: 753860
Implantable Lead Model: 7122
Implantable Pulse Generator Implant Date: 20200306
Lead Channel Impedance Value: 1125 Ohm
Lead Channel Impedance Value: 440 Ohm
Lead Channel Impedance Value: 480 Ohm
Lead Channel Pacing Threshold Amplitude: 0.5 V
Lead Channel Pacing Threshold Amplitude: 0.625 V
Lead Channel Pacing Threshold Amplitude: 1.125 V
Lead Channel Pacing Threshold Pulse Width: 0.5 ms
Lead Channel Pacing Threshold Pulse Width: 0.5 ms
Lead Channel Pacing Threshold Pulse Width: 0.5 ms
Lead Channel Sensing Intrinsic Amplitude: 12 mV
Lead Channel Sensing Intrinsic Amplitude: 5 mV
Lead Channel Setting Pacing Amplitude: 1.625
Lead Channel Setting Pacing Amplitude: 2 V
Lead Channel Setting Pacing Amplitude: 2.125
Lead Channel Setting Pacing Pulse Width: 0.5 ms
Lead Channel Setting Pacing Pulse Width: 0.5 ms
Lead Channel Setting Sensing Sensitivity: 0.5 mV
Pulse Gen Serial Number: 9878925

## 2020-02-10 NOTE — Progress Notes (Signed)
Remote ICD transmission.   

## 2020-02-15 NOTE — Progress Notes (Signed)
   Patient is here for follow up visit.  Subjective:   @Patient ID: Sarah English, female    DOB: 02/09/1976, 44 y.o.   MRN: [id number]   No chief complaint on file.   HPI   43 y/o Caucasian female with  nonischemic dilated cardiomyopathy, now s/p BiVICD placement, Hashimoto thyroiditis, systemic sclerosis, Sjogren's disease, Raynaud's syndrome, morbid obesity, endometriosis, menorrhagia.  Patient has been compliant with her medical therapy. However, she continues to have leg swelling and occasional lightheadedness symptoms.  Pressure is not low. She does not check her weight regularly.    Current Outpatient Medications on File Prior to Visit  Medication Sig Dispense Refill  . albuterol (PROVENTIL HFA;VENTOLIN HFA) 108 (90 Base) MCG/ACT inhaler Inhale into the lungs every 6 (six) hours as needed for wheezing or shortness of breath.    . antiseptic oral rinse (BIOTENE) LIQD 1-2 application by Mouth Rinse route every hour as needed for dry mouth.    . CORLANOR 7.5 MG TABS tablet TAKE 1 TABLET BY MOUTH TWICE A DAY WITH MEALS 60 tablet 3  . cyclobenzaprine (FLEXERIL) 10 MG tablet Take 10 mg by mouth 3 (three) times daily as needed for muscle spasms.    . cycloSPORINE (RESTASIS) 0.05 % ophthalmic emulsion Place 1 drop into both eyes 2 (two) times daily.     . dapagliflozin propanediol (FARXIGA) 5 MG TABS tablet Take 1 tablet (5 mg total) by mouth daily. 30 tablet 1  . furosemide (LASIX) 20 MG tablet TAKE 1 TABLET BY MOUTH AS NEEDED 30 tablet 2  . gabapentin (NEURONTIN) 300 MG capsule TAKE 1 CAPSULE BY MOUTH THREE TIMES A DAY 270 capsule 2  . Lactobacillus Rhamnosus, GG, (CULTURELLE PROBIOTICS KIDS PO) Take 1 tablet by mouth daily.    . Magnesium 200 MG TABS Take 1 tablet by mouth daily.    . metoprolol succinate (TOPROL-XL) 50 MG 24 hr tablet TAKE 1 TABLET BY MOUTH EVERY DAY 90 tablet 0  . Multiple Vitamin (MULTIVITAMIN) tablet Take 1 tablet by mouth daily.    . pantoprazole (PROTONIX) 40  MG tablet Take 40 mg by mouth daily.    . PAZEO 0.7 % SOLN Place 1 drop into both eyes daily.    . sacubitril-valsartan (ENTRESTO) 97-103 MG Take 1 tablet by mouth 2 (two) times daily. 180 tablet 1  . saline (AYR) GEL Place 1 application into the nose daily.    . selenium (SELENIMIN-200) 200 MCG TABS tablet Take 200 mcg by mouth daily.     . silver sulfADIAZINE (SILVADENE) 1 % cream Apply 1 application topically 2 (two) times daily as needed (wounds/affected area(s) of skin.).     . spironolactone (ALDACTONE) 50 MG tablet Take 1 tablet (50 mg total) by mouth daily. 30 tablet 3  . SUMAtriptan (IMITREX) 100 MG tablet Take 100 mg by mouth every 6 (six) hours as needed for migraine. May repeat in 2 hours if headache persists or recurs.     No current facility-administered medications on file prior to visit.    Cardiovascular studies:  Echocardiogram 02/03/2020:  Left ventricle cavity is normal in size. Mild concentric hypertrophy of  the left ventricle. Mild global hypokinesis. LVEF 45-50%. Doppler evidence  of grade I (impaired) diastolic dysfunction, normal LAP.  Left atrial cavity is mildly dilated.  Estimated RA pressure 8 mmHg.  Compared to previous study 06/27/2019, LVEF has improved from 30-35% to  45-50%.   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  RV: 46/6 mmHg. RVEDP 11 mmHg PA 56/28 mmHg, mean PAP 40 mmHg PW: 27 mmHg LV 137/0 mmHg with LVEDP 36 mmHg  CO 5.2 L/min CI 2.2 L/min/m2  10/17/2018: Glucose 88, BUN/Cr 14/1.08. EGFR 63. Na/K 139/4.6.  H/H 13.6/41.9. MCV 91. Platelets 388  12/2018: TSH 3.5 normal, free T4 0.78 normal Thyroperoxidase Ab 22 (elevated)   Review of Systems  Constitutional: Negative for decreased appetite, malaise/fatigue, weight gain and weight loss.  HENT: Negative for congestion.   Eyes:  Negative for visual disturbance.  Cardiovascular: Positive for dyspnea on exertion and leg swelling. Negative for chest pain, palpitations and syncope.  Respiratory: Negative for shortness of breath.   Endocrine: Negative for cold intolerance.  Hematologic/Lymphatic: Does not bruise/bleed easily.  Skin: Negative for itching and rash.  Musculoskeletal: Negative for myalgias.  Gastrointestinal: Negative for abdominal pain, nausea and vomiting.  Genitourinary: Negative for dysuria.  Neurological: Negative for dizziness and weakness.  Psychiatric/Behavioral: The patient is not nervous/anxious.   All other systems reviewed and are negative.      Objective:    There were no vitals filed for this visit.    Physical Exam Not performed. Telephone visit.      Assessment & Recommendations:   43 y/o Caucasian female with  nonischemic dilated cardiomyopathy, now s/p BiVICD placement, Hashimoto thyroiditis, systemic sclerosis, Sjogren's disease, Raynaud's syndrome, morbid obesity, endometriosis, menorrhagia.  1. Chronic systolic heart failure (HCC):  Mild improvement in LVEF now at 30-35%. Nonischemic cardiomyopathy. NYHA class II-III symptoms. Continue Entresto 97-103 mg bid, metoprolol succinate to 50 mg daily,  spironolactone to 50 mg daily, corlanor to 7.5 mg bid, Farxiga 5 mg daily. Take lasix 20 mg daily, with additional dose as needed, for leg edema. Patient must check daily weights.  S/p Biv ICD. Encourage increased physical activity and weight loss.   2. Hypertension:     Controlled      Echocardiogram and f/u in June  Manish J Patwardhan, MD [facility] Pager: [phone] Office: [phone] If no answer Cell [phone]    

## 2020-02-16 ENCOUNTER — Ambulatory Visit: Payer: Medicaid Other | Admitting: Cardiology

## 2020-02-17 DIAGNOSIS — Z91199 Patient's noncompliance with other medical treatment and regimen due to unspecified reason: Secondary | ICD-10-CM | POA: Insufficient documentation

## 2020-02-18 ENCOUNTER — Encounter: Payer: Self-pay | Admitting: Cardiology

## 2020-02-18 ENCOUNTER — Other Ambulatory Visit (HOSPITAL_COMMUNITY): Payer: Self-pay | Admitting: Cardiology

## 2020-02-18 ENCOUNTER — Ambulatory Visit: Payer: Medicaid Other | Admitting: Cardiology

## 2020-02-18 ENCOUNTER — Other Ambulatory Visit: Payer: Self-pay

## 2020-02-18 VITALS — BP 139/77 | HR 83 | Resp 18 | Ht 65.0 in | Wt 325.0 lb

## 2020-02-18 DIAGNOSIS — I10 Essential (primary) hypertension: Secondary | ICD-10-CM

## 2020-02-18 DIAGNOSIS — Z95810 Presence of automatic (implantable) cardiac defibrillator: Secondary | ICD-10-CM

## 2020-02-18 DIAGNOSIS — I5020 Unspecified systolic (congestive) heart failure: Secondary | ICD-10-CM

## 2020-02-18 NOTE — Progress Notes (Addendum)
Patient is here for follow up visit.  Subjective:   @Patient  ID: Sarah English, female    DOB: 1976/07/22, 44 y.o.   MRN: [id number]   Chief Complaint  Patient presents with  . Cardiomyopathy  . Follow-up    4 month    HPI   44 y/o Caucasian female with  nonischemic dilated cardiomyopathy, now s/p BiVICD placement, Hashimoto thyroiditis, systemic sclerosis, Sjogren's disease, Raynaud's syndrome, morbid obesity, endometriosis, menorrhagia.  Patient is doing fairly well with unchanged mild exertional dyspnea.  Leg edema has improved.  She is compliant with medical therapy.  Recent echocardiogram findings reviewed with the patient, decreased.  Current Outpatient Medications on File Prior to Visit  Medication Sig Dispense Refill  . albuterol (PROVENTIL HFA;VENTOLIN HFA) 108 (90 Base) MCG/ACT inhaler Inhale into the lungs every 6 (six) hours as needed for wheezing or shortness of breath.    Marland Kitchen antiseptic oral rinse (BIOTENE) LIQD 1-2 application by Mouth Rinse route every hour as needed for dry mouth.    . CORLANOR 7.5 MG TABS tablet TAKE 1 TABLET BY MOUTH TWICE A DAY WITH MEALS 60 tablet 3  . cyclobenzaprine (FLEXERIL) 10 MG tablet Take 10 mg by mouth 3 (three) times daily as needed for muscle spasms.    . cycloSPORINE (RESTASIS) 0.05 % ophthalmic emulsion Place 1 drop into both eyes 2 (two) times daily.     . dapagliflozin propanediol (FARXIGA) 5 MG TABS tablet Take 1 tablet (5 mg total) by mouth daily. 30 tablet 1  . furosemide (LASIX) 20 MG tablet TAKE 1 TABLET BY MOUTH AS NEEDED 30 tablet 2  . gabapentin (NEURONTIN) 300 MG capsule TAKE 1 CAPSULE BY MOUTH THREE TIMES A DAY 270 capsule 2  . Lactobacillus Rhamnosus, GG, (CULTURELLE PROBIOTICS KIDS PO) Take 1 tablet by mouth daily.    . Magnesium 200 MG TABS Take 1 tablet by mouth daily.    . metoprolol succinate (TOPROL-XL) 50 MG 24 hr tablet TAKE 1 TABLET BY MOUTH EVERY DAY 90 tablet 0  . Multiple Vitamin (MULTIVITAMIN) tablet Take  1 tablet by mouth daily.    . pantoprazole (PROTONIX) 40 MG tablet Take 40 mg by mouth daily.    Marland Kitchen PAZEO 0.7 % SOLN Place 1 drop into both eyes daily.    . sacubitril-valsartan (ENTRESTO) 97-103 MG Take 1 tablet by mouth 2 (two) times daily. 180 tablet 1  . saline (AYR) GEL Place 1 application into the nose daily.    Marland Kitchen selenium (SELENIMIN-200) 200 MCG TABS tablet Take 200 mcg by mouth daily.     . silver sulfADIAZINE (SILVADENE) 1 % cream Apply 1 application topically 2 (two) times daily as needed (wounds/affected area(s) of skin.).     Marland Kitchen spironolactone (ALDACTONE) 50 MG tablet Take 1 tablet (50 mg total) by mouth daily. 30 tablet 3  . SUMAtriptan (IMITREX) 100 MG tablet Take 100 mg by mouth every 6 (six) hours as needed for migraine. May repeat in 2 hours if headache persists or recurs. (Patient not taking: Reported on 02/18/2020)     No current facility-administered medications on file prior to visit.    Cardiovascular studies:  Echocardiogram 02/03/2020:  Left ventricle cavity is normal in size. Mild concentric hypertrophy of  the left ventricle. Mild global hypokinesis. LVEF 45-50%. Doppler evidence  of grade I (impaired) diastolic dysfunction, normal LAP.  Left atrial cavity is mildly dilated.  Estimated RA pressure 8 mmHg.  Compared to previous study 06/27/2019, LVEF has improved from 30-35%  to  45-50%.   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  RV: 46/6 mmHg. RVEDP 11 mmHg PA 56/28 mmHg, mean PAP 40 mmHg PW: 27 mmHg LV 137/0 mmHg with LVEDP 36 mmHg  CO 5.2 L/min CI 2.2 L/min/m2  Recent labs: 10/17/2018: Glucose 88, BUN/Cr 14/1.08. EGFR 63. Na/K 139/4.6.  H/H 13.6/41.9. MCV 91. Platelets 388  12/2018: TSH 3.5 normal, free T4 0.78 normal Thyroperoxidase Ab 22 (elevated)   Review of Systems  Cardiovascular:  Positive for dyspnea on exertion (Mild, stable). Negative for chest pain, leg swelling, palpitations and syncope.       Objective:    Vitals:   02/18/20 1428  BP: 139/77  Pulse: 83  Resp: 18  SpO2: 98%      Physical Exam Vitals and nursing note reviewed.  Constitutional:      General: She is not in acute distress.    Comments: Morbidly obese  Neck:     Vascular: No JVD.  Cardiovascular:     Rate and Rhythm: Normal rate and regular rhythm.     Pulses: Intact distal pulses.     Heart sounds: Normal heart sounds. No murmur heard.   Pulmonary:     Effort: Pulmonary effort is normal.     Breath sounds: Normal breath sounds. No wheezing or rales.  Musculoskeletal:     Right lower leg: Edema (Trace) present.     Left lower leg: Edema (Trace) present.         Assessment & Recommendations:   44 y/o Caucasian female with  nonischemic dilated cardiomyopathy, now s/p BiVICD placement, Hashimoto thyroiditis, systemic sclerosis, Sjogren's disease, Raynaud's syndrome, morbid obesity, endometriosis, menorrhagia.  Chronic systolic heart failure [facility]): Nonischemic cardiomyopathy. NYHA class II LVEF improved to 45 to 50% (01/2020) Continue Entresto 97-103 mg bid, metoprolol succinate to 50 mg daily,  spironolactone to 50 mg daily, corlanor to 7.5 mg bid, Farxiga 5 mg daily. Take lasix 20 mg daily, with additional dose as needed, for leg edema. Patient must check daily weights.  S/p Biv ICD.  She would like to continue monitoring with [facility] Encourage increased physical activity and weight loss.   Hypertension: Controlled      BMP today.  Follow-up in 6 months  Elverna Caffee Esther Hardy, MD [facility] Pager: [phone] Office: [phone] If no answer Cell [phone]

## 2020-02-19 ENCOUNTER — Ambulatory Visit: Payer: Medicaid Other | Admitting: Cardiology

## 2020-02-19 LAB — BASIC METABOLIC PANEL
BUN/Creatinine Ratio: 12 (ref 9–23)
BUN: 11 mg/dL (ref 6–24)
CO2: 27 mmol/L (ref 20–29)
Calcium: 9.7 mg/dL (ref 8.7–10.2)
Chloride: 103 mmol/L (ref 96–106)
Creatinine, Ser: 0.95 mg/dL (ref 0.57–1.00)
GFR calc Af Amer: 84 mL/min/{1.73_m2} (ref >59)
GFR calc non Af Amer: 73 mL/min/{1.73_m2} (ref >59)
Glucose: 93 mg/dL (ref 65–99)
Potassium: 4.4 mmol/L (ref 3.5–5.2)
Sodium: 142 mmol/L (ref 134–144)

## 2020-03-15 ENCOUNTER — Other Ambulatory Visit: Payer: Self-pay | Admitting: Cardiology

## 2020-03-15 DIAGNOSIS — I428 Other cardiomyopathies: Secondary | ICD-10-CM

## 2020-03-15 DIAGNOSIS — I5022 Chronic systolic (congestive) heart failure: Secondary | ICD-10-CM

## 2020-03-17 ENCOUNTER — Other Ambulatory Visit: Payer: Self-pay | Admitting: Cardiology

## 2020-04-06 ENCOUNTER — Other Ambulatory Visit: Payer: Self-pay | Admitting: Cardiology

## 2020-04-06 ENCOUNTER — Telehealth: Payer: Self-pay

## 2020-04-06 DIAGNOSIS — I5022 Chronic systolic (congestive) heart failure: Secondary | ICD-10-CM

## 2020-04-06 NOTE — Telephone Encounter (Signed)
Nope.

## 2020-04-06 NOTE — Telephone Encounter (Signed)
Pt called stating she has an dentist appt on 04/08/2020 and wanted to know if it was any medication she need to stop before her appt

## 2020-04-06 NOTE — Telephone Encounter (Signed)
Spoke with patient. Patient voiced understanding.

## 2020-04-13 ENCOUNTER — Other Ambulatory Visit: Payer: Self-pay | Admitting: Cardiology

## 2020-05-10 ENCOUNTER — Ambulatory Visit (INDEPENDENT_AMBULATORY_CARE_PROVIDER_SITE_OTHER): Payer: Medicaid Other | Admitting: *Deleted

## 2020-05-10 DIAGNOSIS — I429 Cardiomyopathy, unspecified: Secondary | ICD-10-CM | POA: Diagnosis not present

## 2020-05-10 LAB — CUP PACEART REMOTE DEVICE CHECK
Battery Remaining Longevity: 76 mo
Battery Remaining Percentage: 81 %
Battery Voltage: 3.01 V
Brady Statistic AP VP Percent: 9.3 %
Brady Statistic AP VS Percent: 1 %
Brady Statistic AS VP Percent: 90 %
Brady Statistic AS VS Percent: 1 %
Brady Statistic RA Percent Paced: 10 %
Date Time Interrogation Session: 20210920020017
HighPow Impedance: 75 Ohm
HighPow Impedance: 75 Ohm
Implantable Lead Implant Date: 20200306
Implantable Lead Implant Date: 20200306
Implantable Lead Implant Date: 20200306
Implantable Lead Location: 753858
Implantable Lead Location: 753859
Implantable Lead Location: 753860
Implantable Lead Model: 7122
Implantable Pulse Generator Implant Date: 20200306
Lead Channel Impedance Value: 1175 Ohm
Lead Channel Impedance Value: 440 Ohm
Lead Channel Impedance Value: 510 Ohm
Lead Channel Pacing Threshold Amplitude: 0.5 V
Lead Channel Pacing Threshold Amplitude: 0.625 V
Lead Channel Pacing Threshold Amplitude: 1.125 V
Lead Channel Pacing Threshold Pulse Width: 0.5 ms
Lead Channel Pacing Threshold Pulse Width: 0.5 ms
Lead Channel Pacing Threshold Pulse Width: 0.5 ms
Lead Channel Sensing Intrinsic Amplitude: 12 mV
Lead Channel Sensing Intrinsic Amplitude: 5 mV
Lead Channel Setting Pacing Amplitude: 1.625
Lead Channel Setting Pacing Amplitude: 2 V
Lead Channel Setting Pacing Amplitude: 2.125
Lead Channel Setting Pacing Pulse Width: 0.5 ms
Lead Channel Setting Pacing Pulse Width: 0.5 ms
Lead Channel Setting Sensing Sensitivity: 0.5 mV
Pulse Gen Serial Number: 9878925

## 2020-05-11 NOTE — Progress Notes (Signed)
Remote ICD transmission.   

## 2020-05-23 ENCOUNTER — Other Ambulatory Visit: Payer: Self-pay | Admitting: Cardiology

## 2020-05-25 ENCOUNTER — Other Ambulatory Visit: Payer: Self-pay

## 2020-05-25 MED ORDER — ENTRESTO 97-103 MG PO TABS: 1.0000 | ORAL_TABLET | Freq: Two times a day (BID) | ORAL | 5 refills | Status: DC

## 2020-05-28 ENCOUNTER — Other Ambulatory Visit: Payer: Self-pay | Admitting: Gastroenterology

## 2020-06-02 ENCOUNTER — Other Ambulatory Visit: Payer: Self-pay | Admitting: Cardiology

## 2020-06-02 DIAGNOSIS — I5022 Chronic systolic (congestive) heart failure: Secondary | ICD-10-CM

## 2020-06-02 DIAGNOSIS — I428 Other cardiomyopathies: Secondary | ICD-10-CM

## 2020-06-29 NOTE — Progress Notes (Signed)
Attempted to obtain medical history via telephone, unable to reach at this time. I left a voicemail to return pre surgical testing department's phone call.  

## 2020-07-05 ENCOUNTER — Other Ambulatory Visit: Payer: Self-pay

## 2020-07-05 ENCOUNTER — Encounter (HOSPITAL_COMMUNITY): Payer: Self-pay

## 2020-07-06 ENCOUNTER — Inpatient Hospital Stay (HOSPITAL_COMMUNITY): Admission: RE | Admit: 2020-07-06 | Payer: Medicaid Other | Source: Ambulatory Visit

## 2020-07-06 ENCOUNTER — Other Ambulatory Visit (HOSPITAL_COMMUNITY)
Admission: RE | Admit: 2020-07-06 | Discharge: 2020-07-06 | Disposition: A | Discharge status: Home / Self Care | Payer: Medicaid Other | Source: Ambulatory Visit | Attending: Gastroenterology | Admitting: Gastroenterology

## 2020-07-06 DIAGNOSIS — Z01812 Encounter for preprocedural laboratory examination: Secondary | ICD-10-CM | POA: Diagnosis not present

## 2020-07-06 DIAGNOSIS — Z20822 Contact with and (suspected) exposure to covid-19: Secondary | ICD-10-CM | POA: Diagnosis not present

## 2020-07-06 LAB — SARS CORONAVIRUS 2 (TAT 6-24 HRS): SARS Coronavirus 2: NEGATIVE

## 2020-07-08 ENCOUNTER — Encounter (HOSPITAL_COMMUNITY)
Admission: RE | Disposition: A | Discharge status: Home / Self Care | Payer: Self-pay | Source: Home / Self Care | Attending: Gastroenterology

## 2020-07-08 ENCOUNTER — Other Ambulatory Visit: Payer: Self-pay

## 2020-07-08 ENCOUNTER — Ambulatory Visit (HOSPITAL_COMMUNITY): Payer: Medicaid Other | Admitting: Certified Registered Nurse Anesthetist

## 2020-07-08 ENCOUNTER — Encounter (HOSPITAL_COMMUNITY): Payer: Self-pay | Admitting: Gastroenterology

## 2020-07-08 ENCOUNTER — Ambulatory Visit (HOSPITAL_COMMUNITY)
Admission: RE | Admit: 2020-07-08 | Discharge: 2020-07-08 | Disposition: A | Discharge status: Home / Self Care | Payer: Medicaid Other | Attending: Gastroenterology | Admitting: Gastroenterology

## 2020-07-08 DIAGNOSIS — K648 Other hemorrhoids: Secondary | ICD-10-CM | POA: Diagnosis not present

## 2020-07-08 DIAGNOSIS — K635 Polyp of colon: Secondary | ICD-10-CM | POA: Diagnosis not present

## 2020-07-08 DIAGNOSIS — Z882 Allergy status to sulfonamides status: Secondary | ICD-10-CM | POA: Insufficient documentation

## 2020-07-08 DIAGNOSIS — Z87891 Personal history of nicotine dependence: Secondary | ICD-10-CM | POA: Insufficient documentation

## 2020-07-08 DIAGNOSIS — Z79899 Other long term (current) drug therapy: Secondary | ICD-10-CM | POA: Diagnosis not present

## 2020-07-08 DIAGNOSIS — Z86010 Personal history of colonic polyps: Secondary | ICD-10-CM | POA: Insufficient documentation

## 2020-07-08 DIAGNOSIS — Z7984 Long term (current) use of oral hypoglycemic drugs: Secondary | ICD-10-CM | POA: Diagnosis not present

## 2020-07-08 DIAGNOSIS — K625 Hemorrhage of anus and rectum: Secondary | ICD-10-CM | POA: Diagnosis not present

## 2020-07-08 DIAGNOSIS — K5730 Diverticulosis of large intestine without perforation or abscess without bleeding: Secondary | ICD-10-CM | POA: Diagnosis not present

## 2020-07-08 DIAGNOSIS — Z6841 Body Mass Index (BMI) 40.0 and over, adult: Secondary | ICD-10-CM | POA: Diagnosis not present

## 2020-07-08 DIAGNOSIS — Z95810 Presence of automatic (implantable) cardiac defibrillator: Secondary | ICD-10-CM | POA: Diagnosis not present

## 2020-07-08 HISTORY — PX: POLYPECTOMY: SHX5525

## 2020-07-08 HISTORY — PX: COLONOSCOPY WITH PROPOFOL: SHX5780

## 2020-07-08 LAB — GLUCOSE, CAPILLARY: Glucose-Capillary: 116 mg/dL — ABNORMAL HIGH (ref 70–99)

## 2020-07-08 SURGERY — COLONOSCOPY WITH PROPOFOL
Anesthesia: Monitor Anesthesia Care

## 2020-07-08 MED ORDER — LIDOCAINE 2% (20 MG/ML) 5 ML SYRINGE
INTRAMUSCULAR | Status: DC | PRN
  Administered 2020-07-08: 80 mg via INTRAVENOUS

## 2020-07-08 MED ORDER — PROPOFOL 10 MG/ML IV BOLUS
INTRAVENOUS | Status: DC | PRN
  Administered 2020-07-08: 30 mg via INTRAVENOUS
  Administered 2020-07-08: 20 mg via INTRAVENOUS

## 2020-07-08 MED ORDER — PROPOFOL 500 MG/50ML IV EMUL
INTRAVENOUS | Status: DC | PRN
  Administered 2020-07-08: 100 ug/kg/min via INTRAVENOUS

## 2020-07-08 MED ORDER — LACTATED RINGERS IV SOLN: INTRAVENOUS | Status: DC

## 2020-07-08 MED ORDER — SODIUM CHLORIDE 0.9 % IV SOLN: INTRAVENOUS | Status: DC

## 2020-07-08 SURGICAL SUPPLY — 21 items

## 2020-07-08 NOTE — Anesthesia Procedure Notes (Signed)
Procedure Name: MAC Date/Time: 07/08/2020 9:06 AM Performed by: West Pugh, CRNA Pre-anesthesia Checklist: Patient identified, Emergency Drugs available, Suction available, Patient being monitored and Timeout performed Patient Re-evaluated:Patient Re-evaluated prior to induction Oxygen Delivery Method: Simple face mask Preoxygenation: Pre-oxygenation with 100% oxygen Induction Type: IV induction Placement Confirmation: positive ETCO2

## 2020-07-08 NOTE — Discharge Instructions (Signed)

## 2020-07-08 NOTE — Op Note (Signed)
[facility] Patient Name: Sarah English Procedure Date: 07/08/2020 MRN: [id number] Attending MD: Kathi Der , MD Date of Birth: 09-17-1975 CSN: [id number] Age: 44 Admit Type: Outpatient Procedure:                Colonoscopy Indications:              Last colonoscopy 3 years ago, Rectal bleeding,                            Personal history of colonic polyps Providers:                Kathi Der, MD, Estella Husk RN, RN, Michele Mcalpine Technician, Kym Groom, CRNA Referring MD:              Medicines:                Sedation Administered by an Anesthesia Professional Complications:            No immediate complications. Estimated Blood Loss:     Estimated blood loss was minimal. Procedure:                Pre-Anesthesia Assessment:                           - Prior to the procedure, a History and Physical                            was performed, and patient medications and                            allergies were reviewed. The patient's tolerance of                            previous anesthesia was also reviewed. The risks                            and benefits of the procedure and the sedation                            options and risks were discussed with the patient.                            All questions were answered, and informed consent                            was obtained. Prior Anticoagulants: The patient has                            taken no previous anticoagulant or antiplatelet                            agents. ASA Grade Assessment: III - A patient with  severe systemic disease. After reviewing the risks                            and benefits, the patient was deemed in                            satisfactory condition to undergo the procedure.                           After obtaining informed consent, the colonoscope                            was passed under direct vision.  Throughout the                            procedure, the patient's blood pressure, pulse, and                            oxygen saturations were monitored continuously. The                            PCF-H190DL ([id number]) Olympus pediatric colonscope                            was introduced through the anus and advanced to the                            the terminal ileum, with identification of the                            appendiceal orifice and IC valve. The colonoscopy                            was performed without difficulty. The patient                            tolerated the procedure well. The quality of the                            bowel preparation was good. Scope In: 9:16:04 AM Scope Out: 9:31:17 AM Scope Withdrawal Time: 0 hours 8 minutes 15 seconds  Total Procedure Duration: 0 hours 15 minutes 13 seconds  Findings:      Hemorrhoids were found on perianal exam.      The terminal ileum appeared normal.      A few small-mouthed diverticula were found in the sigmoid colon and       descending colon.      A 4 mm polyp was found in the distal sigmoid colon. The polyp was       sessile. The polyp was removed with a cold snare. Resection and       retrieval were complete.      Internal hemorrhoids were found during retroflexion. The hemorrhoids       were medium-sized. Impression:               - Hemorrhoids  found on perianal exam.                           - The examined portion of the ileum was normal.                           - Diverticulosis in the sigmoid colon and in the                            descending colon.                           - One 4 mm polyp in the distal sigmoid colon,                            removed with a cold snare. Resected and retrieved.                           - Internal hemorrhoids. Moderate Sedation:      Moderate (conscious) sedation was personally administered by an       anesthesia professional. The following parameters were  monitored: oxygen       saturation, heart rate, blood pressure, and response to care. Recommendation:           - Patient has a contact number available for                            emergencies. The signs and symptoms of potential                            delayed complications were discussed with the                            patient. Return to normal activities tomorrow.                            Written discharge instructions were provided to the                            patient.                           - Resume previous diet.                           - Continue present medications.                           - Await pathology results.                           - Repeat colonoscopy in 5 years for surveillance.                           - Return to my office PRN. Procedure Code(s):        --- Professional ---  46568, Colonoscopy, flexible; with removal of                            tumor(s), polyp(s), or other lesion(s) by snare                            technique Diagnosis Code(s):        --- Professional ---                           K64.8, Other hemorrhoids                           K63.5, Polyp of colon                           K62.5, Hemorrhage of anus and rectum                           Z86.010, Personal history of colonic polyps                           K57.30, Diverticulosis of large intestine without                            perforation or abscess without bleeding CPT copyright 2019 American Medical Association. All rights reserved. The codes documented in this report are preliminary and upon coder review may  be revised to meet current compliance requirements. Kathi Der, MD Kathi Der, MD 07/08/2020 9:35:40 AM Number of Addenda: 0

## 2020-07-08 NOTE — Transfer of Care (Signed)
Immediate Anesthesia Transfer of Care Note  Patient: Sarah English  Procedure(s) Performed: COLONOSCOPY WITH PROPOFOL (N/A ) POLYPECTOMY  Patient Location: PACU and Endoscopy Unit  Anesthesia Type:MAC  Level of Consciousness: awake, alert , oriented and patient cooperative  Airway & Oxygen Therapy: Patient Spontanous Breathing and Patient connected to face mask oxygen  Post-op Assessment: Report given to RN and Post -op Vital signs reviewed and stable  Post vital signs: Reviewed and stable  Last Vitals:  Vitals Value Taken Time  BP    Temp    Pulse 85 07/08/20 0937  Resp 9 07/08/20 0937  SpO2 95 % 07/08/20 0937  Vitals shown include unvalidated device data.  Last Pain:  Vitals:   07/08/20 0857  TempSrc: Oral  PainSc: 0-No pain         Complications: No complications documented.

## 2020-07-08 NOTE — H&P (Signed)
Primary Care Physician:  Aida Puffer, MD Primary Gastroenterologist:  Dr.Sharlynn Seckinger  Reason for Visit : Colonoscopy for rectal bleeding  HPI: Sarah English is a 44 y.o. female with past medical history of morbid obesity, nonischemic cardiomyopathy with ICD placement, history of left bundle branch block was seen in the office for evaluation of rectal bleeding.  Last colonoscopy in 2018 showed fair prep and small polyp in the transverse colon which was removed but not retrieved.  Rectal bleeding has resolved now.  She is complaining of occasional right lower quadrant discomfort particularly with certain activities and with cough.  Denies any diarrhea.  Denies any nausea or vomiting.  Last echo cardiogram in June 2021 showed EF of 45 to 50%.  Past Medical History:  Diagnosis Date  . Anemia   . Autoimmune disorder (HCC)   . Bronchitis    hx -uses inhaler prn  . Chronic kidney disease    kidney infections  . Endometriosis   . Fibromyalgia   . GERD (gastroesophageal reflux disease)    occasional  . Hashimoto thyroiditis, fibrous variant   . Hashimoto's disease   . Headache    migraines  . History of kidney stones   . Hypertension   . IVCD (intraventricular conduction defect)   . LBBB (left bundle branch block)   . Nonischemic cardiomyopathy (HCC)    per stress test 12-17-2017  ef 14%;  per echo 12-13-2017 ef 21%  . Peripheral vascular disease (HCC)    raynoids syndrome  . Pneumonia    only x 1 - 2- 3 months ago  . 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 Scleroderma (HCC)   . Sjogren's syndrome (HCC)   . Sleep apnea    recently dx 04/13/18, does not have CPAP machine yet.  . Systemic sclerosis (HCC)   . Systolic CHF with reduced left ventricular function, NYHA class 3 [facility])    cardiologist-  dr Evlyn Clines patwardhan  . Tendinitis    lower back  . Thyroiditis     Past Surgical History:  Procedure Laterality Date  . BIV ICD  INSERTION CRT-D N/A 10/25/2018   Procedure: BIV ICD INSERTION CRT-D;  Surgeon: Marinus Maw, MD;  Location: [facility] INVASIVE CV LAB;  Service: Cardiovascular;  Laterality: N/A;  . CESAREAN SECTION  2004   x 1  . CHOLECYSTECTOMY    . COLONOSCOPY    . CYSTOSCOPY     x 2 - bladder as a child  . HYSTEROSCOPY WITH D & C N/A 04/16/2018   Procedure: DILATATION AND CURETTAGE /HYSTEROSCOPY;  Surgeon: Lavina Hamman, MD;  Location: [facility] ORS;  Service: Gynecology;  Laterality: N/A;  . LAPAROSCOPIC LYSIS OF ADHESIONS  04/16/2018   Procedure: LAPAROSCOPIC LYSIS OF ADHESIONS;  Surgeon: Lavina Hamman, MD;  Location: [facility] ORS;  Service: Gynecology;;  . MANDIBLE FRACTURE SURGERY  1994   upper mandibular  . RIGHT/LEFT HEART CATH AND CORONARY ANGIOGRAPHY N/A 12/18/2017   Procedure: RIGHT/LEFT HEART CATH AND CORONARY ANGIOGRAPHY;  Surgeon: Elder Negus, MD;  Location: [facility] INVASIVE CV LAB;  Service: Cardiovascular;  Laterality: N/A;  . UPPER GI ENDOSCOPY    . WISDOM TOOTH EXTRACTION      Prior to Admission medications   Medication Sig Start Date End Date Taking? Authorizing Provider  albuterol (PROVENTIL HFA;VENTOLIN HFA) 108 (90 Base) MCG/ACT inhaler Inhale 1 puff into the lungs every 4 (four) hours as needed for wheezing or shortness of breath.  Yes [provider]  antiseptic oral rinse (BIOTENE) LIQD 15 mLs by Mouth Rinse route every hour as needed for dry mouth.    Yes [provider]  Calcium-Magnesium-Zinc (CAL-MAG-ZINC PO) Take 1 tablet by mouth daily.   Yes [provider]  cetirizine (ZYRTEC) 10 MG tablet Take 10 mg by mouth daily.   Yes [provider]  CORLANOR 7.5 MG TABS tablet TAKE 1 TABLET BY MOUTH TWICE A DAY WITH MEALS Patient taking differently: Take 7.5 mg by mouth 2 (two) times daily with a meal.  06/03/20  Yes Patwardhan, Manish J, MD  cyclobenzaprine (FLEXERIL) 10 MG tablet Take 10 mg by mouth 4 (four) times daily as needed for muscle spasms.    Yes  [provider]  cycloSPORINE (RESTASIS) 0.05 % ophthalmic emulsion Place 1 drop into both eyes 2 (two) times daily.    Yes [provider]  FARXIGA 5 MG TABS tablet TAKE 1 TABLET BY MOUTH EVERY DAY Patient taking differently: Take 5 mg by mouth daily.  06/03/20  Yes Patwardhan, Manish J, MD  furosemide (LASIX) 20 MG tablet TAKE 1 TABLET BY MOUTH AS NEEDED Patient taking differently: Take 20 mg by mouth daily as needed for edema.  04/13/20  Yes Patwardhan, Manish J, MD  gabapentin (NEURONTIN) 300 MG capsule TAKE 1 CAPSULE BY MOUTH THREE TIMES A DAY Patient taking differently: Take 300 mg by mouth 3 (three) times daily.  10/23/19  Yes Patel, Donika K, DO  hyoscyamine (LEVSIN SL) 0.125 MG SL tablet Place 0.125 mg under the tongue every 4 (four) hours as needed for cramping.   Yes [provider]  ibuprofen (ADVIL) 600 MG tablet Take 600 mg by mouth daily as needed for moderate pain.   Yes [provider]  metoprolol succinate (TOPROL-XL) 50 MG 24 hr tablet TAKE 1 TABLET BY MOUTH EVERY DAY Patient taking differently: Take 50 mg by mouth daily.  05/24/20  Yes Yates Decamp, MD  Multiple Vitamin (MULTIVITAMIN) tablet Take 1 tablet by mouth daily.   Yes [provider]  pantoprazole (PROTONIX) 40 MG tablet Take 40 mg by mouth daily.   Yes [provider]  PAZEO 0.7 % SOLN Place 1 drop into both eyes daily as needed (allergies).    Yes [provider]  Probiotic Product (PROBIOTIC PO) Take 1 capsule by mouth daily.   Yes [provider]  sacubitril-valsartan (ENTRESTO) 97-103 MG Take 1 tablet by mouth 2 (two) times daily. 05/25/20  Yes Patwardhan, Manish J, MD  selenium (SELENIMIN-200) 200 MCG TABS tablet Take 200 mcg by mouth daily.    Yes [provider]  silver sulfADIAZINE (SILVADENE) 1 % cream Apply 1 application topically 2 (two) times daily as needed (wounds/affected area(s) of skin.).    Yes [provider]  sodium  chloride (OCEAN) 0.65 % SOLN nasal spray Place 1 spray into both nostrils as needed for congestion.   Yes [provider]  spironolactone (ALDACTONE) 50 MG tablet TAKE 1 TABLET BY MOUTH EVERY DAY Patient taking differently: Take 50 mg by mouth daily.  04/06/20  Yes Yates Decamp, MD  SUMAtriptan (IMITREX) 100 MG tablet Take 100 mg by mouth every 6 (six) hours as needed for migraine. May repeat in 2 hours if headache persists or recurs.    Yes [provider]    Scheduled Meds: Continuous Infusions: . sodium chloride     PRN Meds:.  Allergies as of 05/28/2020 - Review Complete 02/18/2020  Allergen Reaction Noted  .  Sulfa antibiotics Nausea And Vomiting 11/11/2013    Family History  Problem Relation Age of Onset  . Diabetes Mother        Type II  . Heart disease Mother        stent surgery  . Hypertension Mother   . Hyperlipidemia Mother   . Diabetes Father        Type II  . Hypertension Father   . Hyperlipidemia Father     Social History   Socioeconomic History  . Marital status: Single    Spouse name: Not on file  . Number of children: 1  . Years of education: 14  . Highest education level: Associate degree: occupational, Scientist, product/process development, or vocational program  Occupational History  . Occupation: not working  Tobacco Use  . Smoking status: Former Smoker    Packs/day: 0.50    Years: 8.00    Pack years: 4.00    Types: Cigarettes    Quit date: 12/10/2012    Years since quitting: 7.5  . Smokeless tobacco: Never Used  Vaping Use  . Vaping Use: Never used  Substance and Sexual Activity  . Alcohol use: Yes    Comment: once a year  . Drug use: No  . Sexual activity: Yes    Birth control/protection: Pill  Other Topics Concern  . Not on file  Social History Narrative   Lives with son, renting a room from her mother.  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     Education: college.    Social Determinants of Health    Financial Resource Strain:   . Difficulty of Paying Living Expenses: Not on file  Food Insecurity:   . Worried About Programme researcher, broadcasting/film/video in the Last Year: Not on file  . Ran Out of Food in the Last Year: Not on file  Transportation Needs:   . Lack of Transportation (Medical): Not on file  . Lack of Transportation (Non-Medical): Not on file  Physical Activity:   . Days of Exercise per Week: Not on file  . Minutes of Exercise per Session: Not on file  Stress:   . Feeling of Stress : Not on file  Social Connections:   . Frequency of Communication with Friends and Family: Not on file  . Frequency of Social Gatherings with Friends and Family: Not on file  . Attends Religious Services: Not on file  . Active Member of Clubs or Organizations: Not on file  . Attends Banker Meetings: Not on file  . Marital Status: Not on file  Intimate Partner Violence:   . Fear of Current or Ex-Partner: Not on file  . Emotionally Abused: Not on file  . Physically Abused: Not on file  . Sexually Abused: Not on file     Physical Exam: Vital signs: There were no vitals filed for this visit.   General:   Morbidly obese, pleasant and cooperative in NAD Lungs:  Clear throughout to auscultation.   No wheezes, crackles, or rhonchi. No acute distress. Heart:  Regular rate and rhythm; no murmurs, clicks, rubs,  or gallops. Abdomen: Soft, nontender, nondistended, bowel sounds present. Rectal:  Deferred  GI:  Lab Results: No results for input(s): WBC, HGB, HCT, PLT in the last 72 hours. BMET No results for input(s): NA, K, CL, CO2, GLUCOSE, BUN, CREATININE, CALCIUM in the last 72 hours. LFT No results for input(s): PROT, ALBUMIN, AST, ALT, ALKPHOS, BILITOT, BILIDIR, IBILI in  the last 72 hours. PT/INR No results for input(s): LABPROT, INR in the last 72 hours.   Studies/Results: No results found.  Impression/Plan: -Rectal bleeding -Personal history of colon polyps -Nonischemic  cardiomyopathy s/p ICD placement.  EF of 45 to 50% as of June 2021.  Not on any anticoagulation. -Morbid obesity with BMI of 52.59.  Recommendations ---------------------- -Proceed with colonoscopy today.  Risks (bleeding, infection, bowel perforation that could require surgery, sedation-related changes in cardiopulmonary systems), benefits (identification and possible treatment of source of symptoms, exclusion of certain causes of symptoms), and alternatives (watchful waiting, radiographic imaging studies, empiric medical treatment)  were explained to patient/family in detail and patient wishes to proceed.    LOS: 0 days   Kathi Der  MD, FACP 07/08/2020, 8:47 AM  Contact #  [phone]

## 2020-07-08 NOTE — Anesthesia Preprocedure Evaluation (Signed)
Anesthesia Evaluation  Patient identified by MRN, date of birth, ID band Patient awake    Reviewed: Allergy & Precautions, NPO status , Patient's Chart, lab work & pertinent test results  Airway Mallampati: I       Dental  (+) Poor Dentition, Missing,    Pulmonary sleep apnea , former smoker,    Pulmonary exam normal breath sounds clear to auscultation       Cardiovascular Exercise Tolerance: Poor hypertension, Pt. on home beta blockers +CHF (EF 21%)  Normal cardiovascular exam  Severe nonischemic dilated cardiomyopathy LBBB   Neuro/Psych negative neurological ROS  negative psych ROS   GI/Hepatic negative GI ROS, Neg liver ROS, GERD  Medicated and Controlled,  Endo/Other  Morbid obesity  Renal/GU negative Renal ROS  negative genitourinary   Musculoskeletal  (+) Fibromyalgia -Multiple autoimmune issues   Abdominal (+) + obese,   Peds negative pediatric ROS (+)  Hematology negative hematology ROS (+)   Anesthesia Other Findings   Reproductive/Obstetrics negative OB ROS                             Anesthesia Physical  Anesthesia Plan  ASA: III  Anesthesia Plan: MAC   Post-op Pain Management:    Induction:   PONV Risk Score and Plan: 3  Airway Management Planned: Nasal Cannula, Natural Airway and Simple Face Mask  Additional Equipment: None  Intra-op Plan:   Post-operative Plan:   Informed Consent:   Plan Discussed with: CRNA  Anesthesia Plan Comments:         Anesthesia Quick Evaluation

## 2020-07-09 LAB — SURGICAL PATHOLOGY

## 2020-07-12 ENCOUNTER — Encounter (HOSPITAL_COMMUNITY): Payer: Self-pay | Admitting: Gastroenterology

## 2020-07-12 NOTE — Anesthesia Postprocedure Evaluation (Addendum)
Anesthesia Post Note  Patient: Sarah English  Procedure(s) Performed: COLONOSCOPY WITH PROPOFOL (N/A ) POLYPECTOMY     Patient location during evaluation: Endoscopy Anesthesia Type: MAC Level of consciousness: awake Pain management: pain level controlled Vital Signs Assessment: post-procedure vital signs reviewed and stable Respiratory status: spontaneous breathing Cardiovascular status: stable Postop Assessment: no apparent nausea or vomiting Anesthetic complications: no   No complications documented.  Last Vitals:  Vitals:   07/08/20 0957 07/08/20 1000  BP: 116/73   Pulse: 75 73  Resp: 14 14  Temp:    SpO2: 97% 95%    Last Pain:  Vitals:   07/08/20 0957  TempSrc:   PainSc: 0-No pain                 Huston Foley

## 2020-07-14 ENCOUNTER — Other Ambulatory Visit: Payer: Self-pay | Admitting: Cardiology

## 2020-08-10 ENCOUNTER — Ambulatory Visit (INDEPENDENT_AMBULATORY_CARE_PROVIDER_SITE_OTHER): Payer: Medicaid Other

## 2020-08-10 DIAGNOSIS — I428 Other cardiomyopathies: Secondary | ICD-10-CM

## 2020-08-11 LAB — CUP PACEART REMOTE DEVICE CHECK
Battery Remaining Longevity: 73 mo
Battery Remaining Percentage: 79 %
Battery Voltage: 2.99 V
Brady Statistic AP VP Percent: 9.3 %
Brady Statistic AP VS Percent: 1 %
Brady Statistic AS VP Percent: 90 %
Brady Statistic AS VS Percent: 1 %
Brady Statistic RA Percent Paced: 10 %
Date Time Interrogation Session: 20211222015024
HighPow Impedance: 88 Ohm
HighPow Impedance: 88 Ohm
Implantable Lead Implant Date: 20200306
Implantable Lead Implant Date: 20200306
Implantable Lead Implant Date: 20200306
Implantable Lead Location: 753858
Implantable Lead Location: 753859
Implantable Lead Location: 753860
Implantable Lead Model: 7122
Implantable Pulse Generator Implant Date: 20200306
Lead Channel Impedance Value: 1175 Ohm
Lead Channel Impedance Value: 510 Ohm
Lead Channel Impedance Value: 510 Ohm
Lead Channel Pacing Threshold Amplitude: 0.5 V
Lead Channel Pacing Threshold Amplitude: 0.625 V
Lead Channel Pacing Threshold Amplitude: 1.125 V
Lead Channel Pacing Threshold Pulse Width: 0.5 ms
Lead Channel Pacing Threshold Pulse Width: 0.5 ms
Lead Channel Pacing Threshold Pulse Width: 0.5 ms
Lead Channel Sensing Intrinsic Amplitude: 12 mV
Lead Channel Sensing Intrinsic Amplitude: 5 mV
Lead Channel Setting Pacing Amplitude: 1.625
Lead Channel Setting Pacing Amplitude: 2 V
Lead Channel Setting Pacing Amplitude: 2.125
Lead Channel Setting Pacing Pulse Width: 0.5 ms
Lead Channel Setting Pacing Pulse Width: 0.5 ms
Lead Channel Setting Sensing Sensitivity: 0.5 mV
Pulse Gen Serial Number: 9878925

## 2020-08-22 ENCOUNTER — Other Ambulatory Visit: Payer: Self-pay | Admitting: Cardiology

## 2020-08-22 DIAGNOSIS — I428 Other cardiomyopathies: Secondary | ICD-10-CM

## 2020-08-22 DIAGNOSIS — I5022 Chronic systolic (congestive) heart failure: Secondary | ICD-10-CM

## 2020-08-25 ENCOUNTER — Ambulatory Visit: Payer: Medicaid Other | Admitting: Cardiology

## 2020-08-25 NOTE — Progress Notes (Signed)
Remote ICD transmission.   

## 2020-08-27 ENCOUNTER — Ambulatory Visit: Payer: Medicaid Other | Admitting: Cardiology

## 2020-08-27 NOTE — Progress Notes (Signed)
Rescheduled °

## 2020-09-12 ENCOUNTER — Other Ambulatory Visit: Payer: Self-pay | Admitting: Cardiology

## 2020-09-12 DIAGNOSIS — I5022 Chronic systolic (congestive) heart failure: Secondary | ICD-10-CM

## 2020-09-15 ENCOUNTER — Ambulatory Visit: Payer: Medicaid Other | Admitting: Cardiology

## 2020-09-17 ENCOUNTER — Ambulatory Visit: Payer: Medicaid Other | Admitting: Cardiology

## 2020-09-19 ENCOUNTER — Other Ambulatory Visit: Payer: Self-pay | Admitting: Neurology

## 2020-09-20 NOTE — Telephone Encounter (Signed)
Needs to schedule appointment for additional refills. 

## 2020-09-23 ENCOUNTER — Ambulatory Visit: Payer: Medicaid Other | Admitting: Cardiology

## 2020-09-23 NOTE — Progress Notes (Signed)
No show °

## 2020-10-23 ENCOUNTER — Other Ambulatory Visit: Payer: Self-pay | Admitting: Cardiology

## 2020-10-23 DIAGNOSIS — I5022 Chronic systolic (congestive) heart failure: Secondary | ICD-10-CM

## 2020-10-23 DIAGNOSIS — I428 Other cardiomyopathies: Secondary | ICD-10-CM

## 2020-11-09 ENCOUNTER — Ambulatory Visit (INDEPENDENT_AMBULATORY_CARE_PROVIDER_SITE_OTHER): Payer: Medicaid Other

## 2020-11-09 DIAGNOSIS — I428 Other cardiomyopathies: Secondary | ICD-10-CM

## 2020-11-09 LAB — CUP PACEART REMOTE DEVICE CHECK
Battery Remaining Longevity: 71 mo
Battery Remaining Percentage: 76 %
Battery Voltage: 2.99 V
Brady Statistic AP VP Percent: 10 %
Brady Statistic AP VS Percent: 1 %
Brady Statistic AS VP Percent: 89 %
Brady Statistic AS VS Percent: 1 %
Brady Statistic RA Percent Paced: 11 %
Date Time Interrogation Session: 20220322050022
HighPow Impedance: 90 Ohm
HighPow Impedance: 90 Ohm
Implantable Lead Implant Date: 20200306
Implantable Lead Implant Date: 20200306
Implantable Lead Implant Date: 20200306
Implantable Lead Location: 753858
Implantable Lead Location: 753859
Implantable Lead Location: 753860
Implantable Lead Model: 7122
Implantable Pulse Generator Implant Date: 20200306
Lead Channel Impedance Value: 1150 Ohm
Lead Channel Impedance Value: 510 Ohm
Lead Channel Impedance Value: 550 Ohm
Lead Channel Pacing Threshold Amplitude: 0.5 V
Lead Channel Pacing Threshold Amplitude: 0.5 V
Lead Channel Pacing Threshold Amplitude: 1.125 V
Lead Channel Pacing Threshold Pulse Width: 0.5 ms
Lead Channel Pacing Threshold Pulse Width: 0.5 ms
Lead Channel Pacing Threshold Pulse Width: 0.5 ms
Lead Channel Sensing Intrinsic Amplitude: 12 mV
Lead Channel Sensing Intrinsic Amplitude: 5 mV
Lead Channel Setting Pacing Amplitude: 1.5 V
Lead Channel Setting Pacing Amplitude: 2 V
Lead Channel Setting Pacing Amplitude: 2.125
Lead Channel Setting Pacing Pulse Width: 0.5 ms
Lead Channel Setting Pacing Pulse Width: 0.5 ms
Lead Channel Setting Sensing Sensitivity: 0.5 mV
Pulse Gen Serial Number: 9878925

## 2020-11-17 NOTE — Progress Notes (Signed)
Remote ICD transmission.   

## 2020-11-29 ENCOUNTER — Other Ambulatory Visit: Payer: Self-pay | Admitting: Cardiology

## 2020-12-21 ENCOUNTER — Other Ambulatory Visit: Payer: Self-pay

## 2020-12-21 ENCOUNTER — Encounter (HOSPITAL_COMMUNITY): Payer: Self-pay | Admitting: Emergency Medicine

## 2020-12-21 ENCOUNTER — Ambulatory Visit (INDEPENDENT_AMBULATORY_CARE_PROVIDER_SITE_OTHER): Payer: Medicaid Other

## 2020-12-21 ENCOUNTER — Ambulatory Visit (HOSPITAL_COMMUNITY)
Admission: EM | Admit: 2020-12-21 | Discharge: 2020-12-21 | Disposition: A | Discharge status: Home / Self Care | Payer: Medicaid Other | Attending: Medical Oncology | Admitting: Medical Oncology

## 2020-12-21 DIAGNOSIS — W19XXXA Unspecified fall, initial encounter: Secondary | ICD-10-CM

## 2020-12-21 DIAGNOSIS — M25571 Pain in right ankle and joints of right foot: Secondary | ICD-10-CM

## 2020-12-21 DIAGNOSIS — M79671 Pain in right foot: Secondary | ICD-10-CM

## 2020-12-21 MED ORDER — IBUPROFEN 600 MG PO TABS: 600.0000 mg | ORAL_TABLET | Freq: Four times a day (QID) | ORAL | 0 refills | Status: DC | PRN

## 2020-12-21 NOTE — ED Provider Notes (Signed)
[facility]    CSN: [id number] Arrival date & time: 12/21/20  1027      History   Chief Complaint Chief Complaint  Patient presents with  . Foot Pain    Right    HPI Sarah English is a 45 y.o. female.   HPI   Right Foot Pain: Patient states that 3 days ago she was walking in the grass when she stepped on a rock and twisted her ankle. She states that pain has not improved at all with time and RICE. She denies new neuro changes of foot, skin color changes, decreased warmth of foot.   Past Medical History:  Diagnosis Date  . Anemia   . Autoimmune disorder (HCC)   . Bronchitis    hx -uses inhaler prn  . Chronic kidney disease    kidney infections  . Endometriosis   . Fibromyalgia   . GERD (gastroesophageal reflux disease)    occasional  . Hashimoto thyroiditis, fibrous variant   . Hashimoto's disease   . Headache    migraines  . History of kidney stones   . Hypertension   . IVCD (intraventricular conduction defect)   . LBBB (left bundle branch block)   . Nonischemic cardiomyopathy (HCC)    per stress test 12-17-2017  ef 14%;  per echo 12-13-2017 ef 21%  . Peripheral vascular disease (HCC)    raynoids syndrome  . Pneumonia    only x 1 - 2- 3 months ago  . 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 Scleroderma (HCC)   . Sjogren's syndrome (HCC)   . Sleep apnea    recently dx 04/13/18, does not have CPAP machine yet.  . Systemic sclerosis (HCC)   . Systolic CHF with reduced left ventricular function, NYHA class 3 [facility])    cardiologist-  dr Evlyn Clines patwardhan  . Tendinitis    lower back  . Thyroiditis     Patient Active Problem List   Diagnosis Date Noted  . No-show for appointment 02/17/2020  . Nonischemic cardiomyopathy (HCC) 07/03/2019  . Essential hypertension 04/16/2019  . Biventricular implantable cardioverter-defibrillator (ICD) in situ 02/24/2019  . Thyroid nodule 12/27/2018  . Pain in left  knee 12/10/2018  . Chronic systolic heart failure (HCC) 10/25/2018  . Symptomatic mammary hypertrophy 09/17/2018  . Back pain 09/17/2018  . Neck pain 09/17/2018  . Pelvic pain in female 04/16/2018  . Abnormal uterine bleeding 04/16/2018  . CHF (congestive heart failure), NYHA class III, acute on chronic, systolic (HCC) 04/02/2018  . Scleroderma (HCC) 04/02/2018  . Snoring 04/02/2018  . Sicca complex (HCC) 04/02/2018  . Sjogren's syndrome with keratoconjunctivitis sicca (HCC) 04/02/2018  . Morbid obesity with body mass index (BMI) of 45.0 to 49.9 in adult [facility]) 04/02/2018  . HFrEF (heart failure with reduced ejection fraction) (HCC) 12/17/2017  . Abnormal stress test 12/17/2017  . Vitamin D insufficiency 02/10/2014  . Severe obesity (BMI >= 40) (HCC) 02/10/2014  . Hashimoto's thyroiditis 11/24/2013  . Other malaise and fatigue 11/24/2013    Past Surgical History:  Procedure Laterality Date  . BIV ICD INSERTION CRT-D N/A 10/25/2018   Procedure: BIV ICD INSERTION CRT-D;  Surgeon: Marinus Maw, MD;  Location: [facility] INVASIVE CV LAB;  Service: Cardiovascular;  Laterality: N/A;  . CESAREAN SECTION  2004   x 1  . CHOLECYSTECTOMY    . COLONOSCOPY    . COLONOSCOPY WITH PROPOFOL N/A 07/08/2020   Procedure: COLONOSCOPY  WITH PROPOFOL;  Surgeon: Kathi Der, MD;  Location: [facility];  Service: Gastroenterology;  Laterality: N/A;  . CYSTOSCOPY     x 2 - bladder as a child  . HYSTEROSCOPY WITH D & C N/A 04/16/2018   Procedure: DILATATION AND CURETTAGE /HYSTEROSCOPY;  Surgeon: Lavina Hamman, MD;  Location: [facility];  Service: Gynecology;  Laterality: N/A;  . LAPAROSCOPIC LYSIS OF ADHESIONS  04/16/2018   Procedure: LAPAROSCOPIC LYSIS OF ADHESIONS;  Surgeon: Lavina Hamman, MD;  Location: [facility];  Service: Gynecology;;  . MANDIBLE FRACTURE SURGERY  1994   upper mandibular  . POLYPECTOMY  07/08/2020   Procedure: POLYPECTOMY;  Surgeon: Kathi Der, MD;  Location: [facility];   Service: Gastroenterology;;  . RIGHT/LEFT HEART CATH AND CORONARY ANGIOGRAPHY N/A 12/18/2017   Procedure: RIGHT/LEFT HEART CATH AND CORONARY ANGIOGRAPHY;  Surgeon: Elder Negus, MD;  Location: [facility] INVASIVE CV LAB;  Service: Cardiovascular;  Laterality: N/A;  . UPPER GI ENDOSCOPY    . WISDOM TOOTH EXTRACTION      OB History   No obstetric history on file.      Home Medications    Prior to Admission medications   Medication Sig Start Date End Date Taking? Authorizing Provider  albuterol (PROVENTIL HFA;VENTOLIN HFA) 108 (90 Base) MCG/ACT inhaler Inhale 1 puff into the lungs every 4 (four) hours as needed for wheezing or shortness of breath.     [provider]  antiseptic oral rinse (BIOTENE) LIQD 15 mLs by Mouth Rinse route every hour as needed for dry mouth.     [provider]  Calcium-Magnesium-Zinc (CAL-MAG-ZINC PO) Take 1 tablet by mouth daily.    [provider]  cetirizine (ZYRTEC) 10 MG tablet Take 10 mg by mouth daily.    [provider]  CORLANOR 7.5 MG TABS tablet TAKE 1 TABLET BY MOUTH TWICE A DAY WITH MEALS 10/25/20   Patwardhan, Manish J, MD  cyclobenzaprine (FLEXERIL) 10 MG tablet Take 10 mg by mouth 4 (four) times daily as needed for muscle spasms.     [provider]  cycloSPORINE (RESTASIS) 0.05 % ophthalmic emulsion Place 1 drop into both eyes 2 (two) times daily.     [provider]  FARXIGA 5 MG TABS tablet TAKE 1 TABLET BY MOUTH EVERY DAY 10/25/20   Patwardhan, Manish J, MD  furosemide (LASIX) 20 MG tablet TAKE 1 TABLET BY MOUTH AS NEEDED 07/14/20   Patwardhan, Manish J, MD  gabapentin (NEURONTIN) 300 MG capsule TAKE 1 CAPSULE BY MOUTH THREE TIMES A DAY 09/20/20   Patel, Donika K, DO  hyoscyamine (LEVSIN SL) 0.125 MG SL tablet Place 0.125 mg under the tongue every 4 (four) hours as needed for cramping.    [provider]  ibuprofen (ADVIL) 600 MG tablet Take 1 tablet (600 mg total) by mouth every 6 (six)  hours as needed for moderate pain. 12/21/20   Rushie Chestnut, PA-C  metoprolol succinate (TOPROL-XL) 50 MG 24 hr tablet TAKE 1 TABLET BY MOUTH EVERY DAY 11/29/20   Patwardhan, Anabel Bene, MD  Multiple Vitamin (MULTIVITAMIN) tablet Take 1 tablet by mouth daily.    [provider]  pantoprazole (PROTONIX) 40 MG tablet Take 40 mg by mouth daily.    [provider]  PAZEO 0.7 % SOLN Place 1 drop into both eyes daily as needed (allergies).     [provider]  Probiotic Product (PROBIOTIC PO) Take 1 capsule by mouth daily.    [provider]  sacubitril-valsartan Sherryll Burger)  97-103 MG Take 1 tablet by mouth 2 (two) times daily. 05/25/20   Patwardhan, Anabel Bene, MD  selenium (SELENIMIN-200) 200 MCG TABS tablet Take 200 mcg by mouth daily.     [provider]  silver sulfADIAZINE (SILVADENE) 1 % cream Apply 1 application topically 2 (two) times daily as needed (wounds/affected area(s) of skin.).     [provider]  sodium chloride (OCEAN) 0.65 % SOLN nasal spray Place 1 spray into both nostrils as needed for congestion.    [provider]  spironolactone (ALDACTONE) 50 MG tablet TAKE 1 TABLET BY MOUTH EVERY DAY 09/13/20   Patwardhan, Manish J, MD  SUMAtriptan (IMITREX) 100 MG tablet Take 100 mg by mouth every 6 (six) hours as needed for migraine. May repeat in 2 hours if headache persists or recurs.     [provider]    Family History Family History  Problem Relation Age of Onset  . Diabetes Mother        Type II  . Heart disease Mother        stent surgery  . Hypertension Mother   . Hyperlipidemia Mother   . Diabetes Father        Type II  . Hypertension Father   . Hyperlipidemia Father     Social History Social History   Tobacco Use  . Smoking status: Former Smoker    Packs/day: 0.50    Years: 8.00    Pack years: 4.00    Types: Cigarettes    Quit date: 12/10/2012    Years since quitting: 8.0  . Smokeless tobacco:  Never Used  Vaping Use  . Vaping Use: Never used  Substance Use Topics  . Alcohol use: Yes    Comment: once a year  . Drug use: No     Allergies   Sulfa antibiotics   Review of Systems Review of Systems  As stated above in HPI Physical Exam Triage Vital Signs ED Triage Vitals  Enc Vitals Group     BP 12/21/20 0921 138/74     Pulse Rate 12/21/20 0921 77     Resp 12/21/20 0921 18     Temp 12/21/20 0921 98.4 F (36.9 C)     Temp Source 12/21/20 0921 Oral     SpO2 12/21/20 0921 96 %     Weight --      Height --      Head Circumference --      Peak Flow --      Pain Score 12/21/20 0918 5     Pain Loc --      Pain Edu? --      Excl. in GC? --    No data found.  Updated Vital Signs BP 138/74 (BP Location: Right Arm)   Pulse 77   Temp 98.4 F (36.9 C) (Oral)   Resp 18   LMP  (LMP Unknown)   SpO2 96%   Physical Exam Vitals and nursing note reviewed.  Cardiovascular:     Pulses: Normal pulses.  Musculoskeletal:        General: Swelling (right mid foot) and tenderness (right heel, right mid foot throughout. NO tenderness of the ankle to palpation) present.     Right lower leg: No edema.     Left lower leg: No edema.     Comments: ROM right ankle intact. ROM right foot limited throughout due to pain  Skin:    General: Skin is warm.     Capillary Refill:  Capillary refill takes less than 2 seconds.     Findings: No bruising or erythema.  Neurological:     Mental Status: She is oriented to person, place, and time.     Sensory: No sensory deficit.     Motor: No weakness.      UC Treatments / Results  Labs (all labs ordered are listed, but only abnormal results are displayed) Labs Reviewed - No data to display  EKG   Radiology DG Foot Complete Right  Result Date: 12/21/2020 CLINICAL DATA:  Right foot pain for 3 days patient reports stepping on a rock in the grass causing a fall. EXAM: RIGHT FOOT COMPLETE - 3+ VIEW COMPARISON:  None. FINDINGS: There is  diffuse soft tissue swelling. No underlying fracture or dislocation identified. Large plantar heel spur. IMPRESSION: Soft tissue swelling.  No fracture or dislocation noted. Electronically Signed   By: Signa Kell M.D.   On: 12/21/2020 10:37    Procedures Procedures (including critical care time)  Medications Ordered in UC Medications - No data to display  Initial Impression / Assessment and Plan / UC Course  I have reviewed the triage vital signs and the nursing notes.  Pertinent labs & imaging results that were available during my care of the patient were reviewed by me and considered in my medical decision making (see chart for details).     New. X ray pending given mild positive fracture testing of the mid foot.   Update: X-ray does not show any fracture.  Treating for a sprain with Ace wrap, she is using a walking cane which is beneficial along with nonsteroidal pain medications. ACE wrap applied. RICE treatment encouraged.  Final Clinical Impressions(s) / UC Diagnoses   Final diagnoses:  Acute right ankle pain  Right foot pain   Discharge Instructions   None    ED Prescriptions    Medication Sig Dispense Auth. Provider   ibuprofen (ADVIL) 600 MG tablet Take 1 tablet (600 mg total) by mouth every 6 (six) hours as needed for moderate pain. 30 tablet Rushie Chestnut, [place]     PDMP not reviewed this encounter.   Rushie Chestnut, [place] 12/21/20 1100

## 2020-12-21 NOTE — ED Triage Notes (Signed)
Pt presents with right foot xs 3 days. States most pain is in the heel. States stepped on rock in grass and fell.

## 2020-12-26 ENCOUNTER — Other Ambulatory Visit: Payer: Self-pay | Admitting: Cardiology

## 2020-12-26 DIAGNOSIS — I5022 Chronic systolic (congestive) heart failure: Secondary | ICD-10-CM

## 2020-12-26 DIAGNOSIS — I428 Other cardiomyopathies: Secondary | ICD-10-CM

## 2021-01-04 ENCOUNTER — Ambulatory Visit: Payer: Medicaid Other | Admitting: Podiatry

## 2021-01-04 ENCOUNTER — Other Ambulatory Visit: Payer: Self-pay

## 2021-01-04 DIAGNOSIS — M722 Plantar fascial fibromatosis: Secondary | ICD-10-CM | POA: Diagnosis not present

## 2021-01-04 MED ORDER — BETAMETHASONE SOD PHOS & ACET 6 (3-3) MG/ML IJ SUSP
6.0000 mg | Freq: Once | INTRAMUSCULAR | Status: AC
  Administered 2021-01-04: 6 mg

## 2021-01-04 NOTE — Progress Notes (Signed)
  Subjective:  Patient ID: Sarah English, female    DOB: [birth year],  MRN: [id number]  Chief Complaint  Patient presents with  . Foot Pain    Right foot pain x 2 weeks. Pt states she went to the urgent care 2 weeks ago. The urgent care did X-rays where they diagnosed pt with bone spurs in her right heel.     45 y.o. female presents with the above complaint. History confirmed with patient.   Objective:  Physical Exam: warm, good capillary refill, no trophic changes or ulcerative lesions, normal DP and PT pulses and normal sensory exam. Right Foot: tenderness to palpation medial calcaneal tuber, no pain with calcaneal squeeze, decreased ankle joint ROM and +Silverskiold test. POP dorsal midfoot with prominent exostosis.   Assessment:   1. Plantar fasciitis      Plan:  Patient was evaluated and treated and all questions answered.  Plantar Fasciitis -Educated patient on stretching and icing of the affected limb -Injection delivered to the plantar fascia of the right foot.  Procedure: Injection Tendon/Ligament Consent: Verbal consent obtained. Location: Right plantar fascia at the glabrous junction; medial approach. Skin Prep: Alcohol. Injectate: 1 cc 0.5% marcaine plain, 1 cc betamethasone acetate-betamethasone sodium phosphate Disposition: Patient tolerated procedure well. Injection site dressed with a band-aid.    Return in about 4 weeks (around 02/01/2021) for Plantar fasciitis.

## 2021-01-05 ENCOUNTER — Telehealth: Payer: Self-pay | Admitting: *Deleted

## 2021-01-05 ENCOUNTER — Encounter: Payer: Self-pay | Admitting: Podiatry

## 2021-01-05 NOTE — Telephone Encounter (Signed)
Patient is calling about stretching exercises she forgot to get before leaving visit. Please call or mail.

## 2021-01-12 ENCOUNTER — Other Ambulatory Visit: Payer: Self-pay | Admitting: Neurology

## 2021-01-13 ENCOUNTER — Ambulatory Visit: Payer: Medicaid Other | Admitting: Internal Medicine

## 2021-01-13 NOTE — Progress Notes (Deleted)
Patient ID: Sarah English, female   DOB: Sep 17, 1975, 45 y.o.   MRN: [id number]  This visit occurred during the SARS-CoV-2 public health emergency.  Safety protocols were in place, including screening questions prior to the visit, additional usage of staff PPE, and extensive cleaning of exam room while observing appropriate contact time as indicated for disinfecting solutions.   HPI  Sarah English is a 45 y.o.-year-old female, returning for f/u for Hashimoto's thyroiditis, thyroid nodule.  Last visit 1 year ago.  Interim history: She recently developed Planter fasciitis.  Reviewed history: Pt. has been dx with thyroiditis based on the aspect of the thyroid at the time of a recent thyroid U/S: heterogenous aspect, c/w thyroiditis.   Afterwards, antithyroid antibodies also returned elevated.  Reviewed her TFTs Lab Results  Component Value Date   TSH 2.52 01/14/2020   TSH 3.54 12/27/2018   TSH 3.51 01/28/2018   TSH 0.21 (L) 03/30/2015   TSH 5.423 (H) 07/30/2014   FREET4 0.69 01/14/2020   FREET4 0.78 12/27/2018   FREET4 0.73 01/28/2018   FREET4 1.02 03/30/2015   FREET4 0.68 (L) 07/30/2014   Her TPO antibodies were elevated: Component     Latest Ref Rng & Units 01/14/2020  Thyroperoxidase Ab SerPl-aCnc     <9 IU/mL 13 (H)   Component     Latest Ref Rng & Units 11/11/2013 01/28/2018 12/27/2018  Thyroperoxidase Ab SerPl-aCnc     <9 IU/mL 74.0 (H) 26 (H) 22 (H)   In the past, she was taking Tri-iodine and Tyrosine (started 1.5 mo prior).  We stopped these.  As the TSH was slightly high in 07/2014, we started her on levothyroxine, but subsequent TSH is slightly suppressed.  We had to stop levothyroxine then.  She has now been off levothyroxine for approximately 6 years.   We started selenium 200 mcg daily in 2019.  She feels better on it.  Pt denies: - feeling nodules in neck - hoarseness - dysphagia - choking - SOB with lying down  Reviewed the report of the thyroid ultrasound  report from 04/15/2013: Right thyroid lobe:  4.5 x 2.1 x 1.6 cm Left thyroid lobe:  4.5 x 1.6 x 2.1 cm Isthmus:  6 mm  Focal nodules:  Thyroid echotexture is diffusely heterogeneous. Thyroid morphology is diffusely lobular.  5 mm hyperechoic focus in the interpolar left lobe on image 24.  Otherwise, no measurable focal solid or cystic nodules seen.  Diffusely increased vascularity.  Lymphadenopathy:  None visualized.  IMPRESSION: Lobular thyroid diffusely with heterogeneous echotexture and suggestion of increased vascularity.  Appearance is nonspecific and could be related to thyroiditis or its sequela.  No FH of thyroid cancer. No h/o radiation tx to head or neck.  No recent contrast studies. No herbal supplements. No Biotin use. No recent steroids use.   She also has scleroderma, Sjogren's syndrome, fibromyalgia, vitamin D deficiency (2000 IU daily)-all managed by her rheumatologist.  She had a MRI, then lumbar puncture performed for R leg weakness with falls >> no MS.  In 2019, she was diagnosed with LBBB + NICMP (EF reduced, 30%), also pulmonary HTN per review of the results of her recent catheterization.  On Entresto and Spironolactone. She had a pacemaker and a defibrillator placed. She had cardiac rehab.  She is a single mom.  ROS: Constitutional: no weight gain/no weight loss, no fatigue, no subjective hyperthermia, no subjective hypothermia Eyes: no blurry vision, no xerophthalmia ENT: no sore throat, + see HPI Cardiovascular: no CP/no SOB/no palpitations/no  leg swelling Respiratory: no cough/no SOB/no wheezing Gastrointestinal: no N/no V/no D/no C/no acid reflux Musculoskeletal: no muscle aches/+ joint aches Skin: no rashes, no hair loss Neurological: no tremors/no numbness/no tingling/no dizziness  I reviewed pt's medications, allergies, PMH, social hx, family hx, and changes were documented in the history of present illness. Otherwise, unchanged from my initial visit  note.  Past Medical History:  Diagnosis Date  . Anemia   . Autoimmune disorder (HCC)   . Bronchitis    hx -uses inhaler prn  . Chronic kidney disease    kidney infections  . Endometriosis   . Fibromyalgia   . GERD (gastroesophageal reflux disease)    occasional  . Hashimoto thyroiditis, fibrous variant   . Hashimoto's disease   . Headache    migraines  . History of kidney stones   . Hypertension   . IVCD (intraventricular conduction defect)   . LBBB (left bundle branch block)   . Nonischemic cardiomyopathy (HCC)    per stress test 12-17-2017  ef 14%;  per echo 12-13-2017 ef 21%  . Peripheral vascular disease (HCC)    raynoids syndrome  . Pneumonia    only x 1 - 2- 3 months ago  . 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 Scleroderma (HCC)   . Sjogren's syndrome (HCC)   . Sleep apnea    recently dx 04/13/18, does not have CPAP machine yet.  . Systemic sclerosis (HCC)   . Systolic CHF with reduced left ventricular function, NYHA class 3 [facility])    cardiologist-  dr Evlyn Clines patwardhan  . Tendinitis    lower back  . Thyroiditis    Past Surgical History:  Procedure Laterality Date  . BIV ICD INSERTION CRT-D N/A 10/25/2018   Procedure: BIV ICD INSERTION CRT-D;  Surgeon: Marinus Maw, MD;  Location: [facility] INVASIVE CV LAB;  Service: Cardiovascular;  Laterality: N/A;  . CESAREAN SECTION  2004   x 1  . CHOLECYSTECTOMY    . COLONOSCOPY    . COLONOSCOPY WITH PROPOFOL N/A 07/08/2020   Procedure: COLONOSCOPY WITH PROPOFOL;  Surgeon: Kathi Der, MD;  Location: [facility] ENDOSCOPY;  Service: Gastroenterology;  Laterality: N/A;  . CYSTOSCOPY     x 2 - bladder as a child  . HYSTEROSCOPY WITH D & C N/A 04/16/2018   Procedure: DILATATION AND CURETTAGE /HYSTEROSCOPY;  Surgeon: Lavina Hamman, MD;  Location: [facility] ORS;  Service: Gynecology;  Laterality: N/A;  . LAPAROSCOPIC LYSIS OF ADHESIONS  04/16/2018   Procedure: LAPAROSCOPIC LYSIS OF  ADHESIONS;  Surgeon: Lavina Hamman, MD;  Location: [facility] ORS;  Service: Gynecology;;  . MANDIBLE FRACTURE SURGERY  1994   upper mandibular  . POLYPECTOMY  07/08/2020   Procedure: POLYPECTOMY;  Surgeon: Kathi Der, MD;  Location: [facility] ENDOS[facility];  Service: Gastroenterology;;  . RIGHT/LEFT HEART CATH AND CORONARY ANGIOGRAPHY N/A 12/18/2017   Procedure: RIGHT/LEFT HEART CATH AND CORONARY ANGIOGRAPHY;  Surgeon: Elder Negus, MD;  Location: [facility] INVASIVE CV LAB;  Service: Cardiovascular;  Laterality: N/A;  . UPPER GI ENDOSCOPY    . WISDOM TOOTH EXTRACTION     Social History   Socioeconomic History  . Marital status: Single    Spouse name: Not on file  . Number of children: 1  . Years of education: 12  . Highest education level: Associate degree: occupational, Scientist, product/process development, or vocational program  Occupational History  . Occupation: not working  Tobacco Use  . Smoking status: Former Smoker  Packs/day: 0.50    Years: 8.00    Pack years: 4.00    Types: Cigarettes    Quit date: 12/10/2012    Years since quitting: 8.0  . Smokeless tobacco: Never Used  Vaping Use  . Vaping Use: Never used  Substance and Sexual Activity  . Alcohol use: Yes    Comment: once a year  . Drug use: No  . Sexual activity: Yes    Birth control/protection: Pill  Other Topics Concern  . Not on file  Social History Narrative   Lives with son, renting a room from her mother.  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     Education: college.    Social Determinants of Health   Financial Resource Strain: Not on file  Food Insecurity: Not on file  Transportation Needs: Not on file  Physical Activity: Not on file  Stress: Not on file  Social Connections: Not on file  Intimate Partner Violence: Not on file   Current Outpatient Medications on File Prior to Visit  Medication Sig Dispense Refill  . ADVAIR DISKUS 100-50 MCG/ACT AEPB 1 puff 2 (two) times daily.    Marland Kitchen  albuterol (PROVENTIL HFA;VENTOLIN HFA) 108 (90 Base) MCG/ACT inhaler Inhale 1 puff into the lungs every 4 (four) hours as needed for wheezing or shortness of breath.     Marland Kitchen antiseptic oral rinse (BIOTENE) LIQD 15 mLs by Mouth Rinse route every hour as needed for dry mouth.     . Calcium-Magnesium-Zinc (CAL-MAG-ZINC PO) Take 1 tablet by mouth daily.    . cetirizine (ZYRTEC) 10 MG tablet Take 10 mg by mouth daily.    . CORLANOR 7.5 MG TABS tablet TAKE 1 TABLET BY MOUTH TWICE A DAY WITH MEALS 60 tablet 3  . cyclobenzaprine (FLEXERIL) 10 MG tablet Take 10 mg by mouth 4 (four) times daily as needed for muscle spasms.     . cycloSPORINE (RESTASIS) 0.05 % ophthalmic emulsion Place 1 drop into both eyes 2 (two) times daily.     Marland Kitchen FARXIGA 5 MG TABS tablet TAKE 1 TABLET BY MOUTH EVERY DAY 30 tablet 1  . furosemide (LASIX) 20 MG tablet TAKE 1 TABLET BY MOUTH AS NEEDED 90 tablet 1  . gabapentin (NEURONTIN) 300 MG capsule TAKE 1 CAPSULE BY MOUTH THREE TIMES A DAY 90 capsule 1  . hyoscyamine (LEVSIN SL) 0.125 MG SL tablet Place 0.125 mg under the tongue every 4 (four) hours as needed for cramping.    Marland Kitchen ibuprofen (ADVIL) 600 MG tablet Take 1 tablet (600 mg total) by mouth every 6 (six) hours as needed for moderate pain. 30 tablet 0  . metoprolol succinate (TOPROL-XL) 50 MG 24 hr tablet TAKE 1 TABLET BY MOUTH EVERY DAY 90 tablet 0  . montelukast (SINGULAIR) 10 MG tablet Take 1 tablet by mouth daily.    . Multiple Vitamin (MULTIVITAMIN) tablet Take 1 tablet by mouth daily.    . pantoprazole (PROTONIX) 40 MG tablet Take 40 mg by mouth daily.    Marland Kitchen PAZEO 0.7 % SOLN Place 1 drop into both eyes daily as needed (allergies).     . Probiotic Product (PROBIOTIC PO) Take 1 capsule by mouth daily.    . sacubitril-valsartan (ENTRESTO) 97-103 MG Take 1 tablet by mouth 2 (two) times daily. 60 tablet 5  . selenium (SELENIMIN-200) 200 MCG TABS tablet Take 200 mcg by mouth daily.     . silver sulfADIAZINE (SILVADENE) 1 % cream  Apply 1 application topically 2 (two) times daily as needed (wounds/affected area(s) of skin.).     Marland Kitchen sodium chloride (OCEAN) 0.65 % SOLN nasal spray Place 1 spray into both nostrils as needed for congestion.    Marland Kitchen spironolactone (ALDACTONE) 50 MG tablet TAKE 1 TABLET BY MOUTH EVERY DAY 90 tablet 2  . SUMAtriptan (IMITREX) 100 MG tablet Take 100 mg by mouth every 6 (six) hours as needed for migraine. May repeat in 2 hours if headache persists or recurs.     . traMADol (ULTRAM) 50 MG tablet 1 tablet as needed     No current facility-administered medications on file prior to visit.   Allergies  Allergen Reactions  . Sulfa Antibiotics Nausea And Vomiting   Family History  Problem Relation Age of Onset  . Diabetes Mother        Type II  . Heart disease Mother        stent surgery  . Hypertension Mother   . Hyperlipidemia Mother   . Diabetes Father        Type II  . Hypertension Father   . Hyperlipidemia Father     PE: LMP  (LMP Unknown)  There is no height or weight on file to calculate BMI. Wt Readings from Last 3 Encounters:  07/08/20 (!) 316 lb (143.3 kg)  02/18/20 (!) 325 lb (147.4 kg)  01/14/20 (!) 320 lb (145.2 kg)   Constitutional: overweight, in NAD Eyes: PERRLA, EOMI, no exophthalmos ENT: moist mucous membranes, no thyromegaly, no cervical lymphadenopathy Cardiovascular: RRR, No MRG Respiratory: CTA B Gastrointestinal: abdomen soft, NT, ND, BS+ Musculoskeletal: no deformities, strength intact in all 4 Skin: moist, warm, no rashes Neurological: no tremor with outstretched hands, DTR normal in all 4  ASSESSMENT: 1. Hashimoto's thyroiditis  2.  Thyroid nodule  PLAN:  1. Patient with history of Hashimoto's thyroiditis, with mildly elevated TSH in the past,  after which we started a low-dose levothyroxine 125 mcg daily.  However, after this, her TSH decreased so we had to stop LT4.  She is now off the medication for more than 5 years.  At last visit, TPO was still  slightly elevated, but improved.   -She continues on selenium 200 mcg daily, which we use to help decrease the thyroid antibodies. -She denies hypothyroid symptoms -At today's visit, we will recheck her TFTs along with TPO antibodies. -She is aware about how to take the levothyroxine correctly if we need to start: every day, with water, at least 30 minutes before breakfast, separated by at least 4 hours from: - acid reflux medications - calcium - iron - multivitamins -If labs are normal, we will see her back in a year  2.  Thyroid nodule -Latest ultrasound from 2014 showed a 5 mm thyroid nodule (high for echogenic focus), not worrisome.  No further investigation was needed for this. -At last visit, she had more pressure in neck, but no abnormalities on palpation.  We discussed that this is most likely related to increase inflammation in the setting of Hashimoto's thyroiditis.  We did discuss about natural anti-inflammatory supplements, be on selenium. -At this visit, she does not feel any pressure or masses in her neck and no nodules are felt on palpation  Carlus Pavlov, MD PhD [facility]

## 2021-01-26 ENCOUNTER — Other Ambulatory Visit: Payer: Self-pay | Admitting: Neurology

## 2021-02-04 ENCOUNTER — Ambulatory Visit: Payer: Medicaid Other | Admitting: Podiatry

## 2021-02-07 ENCOUNTER — Telehealth: Payer: Self-pay | Admitting: Neurology

## 2021-02-07 NOTE — Telephone Encounter (Signed)
Sarah English called in regarding her gabapentin. She said she has an apt 8/4, so she is not sure why patel denied her medicine. Could she get a call back. The pharmacy sent in a requst

## 2021-02-08 ENCOUNTER — Ambulatory Visit (INDEPENDENT_AMBULATORY_CARE_PROVIDER_SITE_OTHER): Payer: Medicaid Other

## 2021-02-08 DIAGNOSIS — I428 Other cardiomyopathies: Secondary | ICD-10-CM | POA: Diagnosis not present

## 2021-02-08 MED ORDER — GABAPENTIN 300 MG PO CAPS: 300.0000 mg | ORAL_CAPSULE | Freq: Three times a day (TID) | ORAL | 1 refills | Status: DC

## 2021-02-08 NOTE — Telephone Encounter (Signed)
Called patient and informed her that her gabapentin has been sent and that no more refills will be sent until patient is seen in office. Patient verbalized understanding and has an appt scheduled and is on the waitlist.

## 2021-02-08 NOTE — Telephone Encounter (Signed)
30-day refill for gabapentin 300mg  TID + 1 refill sent.  No additional refills until she is evaluated in the office.

## 2021-02-09 LAB — CUP PACEART REMOTE DEVICE CHECK
Battery Remaining Longevity: 68 mo
Battery Remaining Percentage: 73 %
Battery Voltage: 2.98 V
Brady Statistic AP VP Percent: 10 %
Brady Statistic AP VS Percent: 1 %
Brady Statistic AS VP Percent: 89 %
Brady Statistic AS VS Percent: 1 %
Brady Statistic RA Percent Paced: 11 %
Date Time Interrogation Session: 20220621053520
HighPow Impedance: 79 Ohm
HighPow Impedance: 79 Ohm
Implantable Lead Implant Date: 20200306
Implantable Lead Implant Date: 20200306
Implantable Lead Implant Date: 20200306
Implantable Lead Location: 753858
Implantable Lead Location: 753859
Implantable Lead Location: 753860
Implantable Lead Model: 7122
Implantable Pulse Generator Implant Date: 20200306
Lead Channel Impedance Value: 1175 Ohm
Lead Channel Impedance Value: 530 Ohm
Lead Channel Impedance Value: 530 Ohm
Lead Channel Pacing Threshold Amplitude: 0.5 V
Lead Channel Pacing Threshold Amplitude: 0.625 V
Lead Channel Pacing Threshold Amplitude: 1 V
Lead Channel Pacing Threshold Pulse Width: 0.5 ms
Lead Channel Pacing Threshold Pulse Width: 0.5 ms
Lead Channel Pacing Threshold Pulse Width: 0.5 ms
Lead Channel Sensing Intrinsic Amplitude: 12 mV
Lead Channel Sensing Intrinsic Amplitude: 5 mV
Lead Channel Setting Pacing Amplitude: 1.625
Lead Channel Setting Pacing Amplitude: 2 V
Lead Channel Setting Pacing Amplitude: 2 V
Lead Channel Setting Pacing Pulse Width: 0.5 ms
Lead Channel Setting Pacing Pulse Width: 0.5 ms
Lead Channel Setting Sensing Sensitivity: 0.5 mV
Pulse Gen Serial Number: 9878925

## 2021-02-15 ENCOUNTER — Ambulatory Visit: Payer: Medicaid Other | Admitting: Podiatry

## 2021-02-15 ENCOUNTER — Other Ambulatory Visit: Payer: Self-pay

## 2021-02-15 DIAGNOSIS — M722 Plantar fascial fibromatosis: Secondary | ICD-10-CM

## 2021-02-15 MED ORDER — BETAMETHASONE SOD PHOS & ACET 6 (3-3) MG/ML IJ SUSP
6.0000 mg | Freq: Once | INTRAMUSCULAR | Status: AC
  Administered 2021-02-15: 6 mg

## 2021-02-15 NOTE — Progress Notes (Signed)
  Subjective:  Patient ID: Sarah English, female    DOB: [birth year],  MRN: [id number]  Chief Complaint  Patient presents with   Plantar Fasciitis    F/U Rt PF -pt states," it's about the same with the ankle pain, heel pain started coming back a week ago; 7/10/" - w/ swelling tx: stretching, icing and compression sock     45 y.o. female presents with the above complaint. History confirmed with patient.   Objective:  Physical Exam: warm, good capillary refill, no trophic changes or ulcerative lesions, normal DP and PT pulses and normal sensory exam. Right Foot: tenderness to palpation medial calcaneal tuber, no pain with calcaneal squeeze, decreased ankle joint ROM and +Silverskiold test. POP dorsal midfoot with prominent exostosis.   Assessment:   1. Plantar fasciitis      Plan:  Patient was evaluated and treated and all questions answered.  Plantar Fasciitis -Repeat injection as below  Procedure: Injection Tendon/Ligament Consent: Verbal consent obtained. Location: Right plantar fascia at the glabrous junction; medial approach. Skin Prep: Alcohol. Injectate: 1 cc 0.5% marcaine plain, 1 cc betamethasone acetate-betamethasone sodium phosphate Disposition: Patient tolerated procedure well. Injection site dressed with a band-aid.  Return if symptoms worsen or fail to improve.

## 2021-02-15 NOTE — Progress Notes (Deleted)
No show for appt. 

## 2021-02-15 NOTE — Addendum Note (Signed)
Addended by: Ventura Sellers on: 02/15/2021 03:00 PM   Modules accepted: Orders

## 2021-02-22 ENCOUNTER — Other Ambulatory Visit: Payer: Self-pay | Admitting: Cardiology

## 2021-02-22 DIAGNOSIS — I5022 Chronic systolic (congestive) heart failure: Secondary | ICD-10-CM

## 2021-02-22 DIAGNOSIS — I428 Other cardiomyopathies: Secondary | ICD-10-CM

## 2021-02-28 NOTE — Progress Notes (Signed)
Remote ICD transmission.   

## 2021-03-04 ENCOUNTER — Other Ambulatory Visit: Payer: Self-pay

## 2021-03-04 ENCOUNTER — Encounter: Payer: Self-pay | Admitting: Neurology

## 2021-03-04 ENCOUNTER — Ambulatory Visit: Payer: Medicaid Other | Admitting: Neurology

## 2021-03-04 VITALS — BP 127/70 | HR 77 | Ht 65.5 in | Wt 325.0 lb

## 2021-03-04 DIAGNOSIS — R202 Paresthesia of skin: Secondary | ICD-10-CM

## 2021-03-04 MED ORDER — GABAPENTIN 300 MG PO CAPS: 300.0000 mg | ORAL_CAPSULE | Freq: Three times a day (TID) | ORAL | 3 refills | Status: DC
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
## 2021-03-04 NOTE — Patient Instructions (Signed)
Continue gabapentin 300mg three times daily ? ?Return to clinic in 1 year ?

## 2021-03-11 ENCOUNTER — Other Ambulatory Visit: Payer: Self-pay | Admitting: Cardiology

## 2021-03-21 ENCOUNTER — Other Ambulatory Visit: Payer: Self-pay

## 2021-03-21 ENCOUNTER — Encounter: Payer: Self-pay | Admitting: Cardiology

## 2021-03-21 ENCOUNTER — Ambulatory Visit: Payer: Medicaid Other | Admitting: Cardiology

## 2021-03-21 VITALS — BP 107/72 | HR 83 | Temp 98.2°F | Resp 16 | Ht 65.0 in | Wt 326.0 lb

## 2021-03-21 DIAGNOSIS — I10 Essential (primary) hypertension: Secondary | ICD-10-CM

## 2021-03-21 DIAGNOSIS — I428 Other cardiomyopathies: Secondary | ICD-10-CM

## 2021-03-21 NOTE — Progress Notes (Signed)
Patient is here for follow up visit.  Subjective:   @Patient  ID: Sarah English, female    DOB: 1976/03/06, 45 y.o.   MRN: [id number]   Chief Complaint  Patient presents with   Nonischemic cardiomyopathy    Follow-up    6 month    HPI   45 y/o Caucasian female with  nonischemic dilated cardiomyopathy, now s/p BiVICD placement, Hashimoto thyroiditis, systemic sclerosis, Sjogren's disease, Raynaud's syndrome, morbid obesity  Patient has recently had exertional dyspnea while working outside in the heat and humidity, for example, in her barn. She is compliant with her medical therapy.    Current Outpatient Medications on File Prior to Visit  Medication Sig Dispense Refill   ADVAIR DISKUS 100-50 MCG/ACT AEPB 1 puff 2 (two) times daily.     albuterol (PROVENTIL HFA;VENTOLIN HFA) 108 (90 Base) MCG/ACT inhaler Inhale 1 puff into the lungs every 4 (four) hours as needed for wheezing or shortness of breath.      Calcium-Magnesium-Zinc (CAL-MAG-ZINC PO) Take 1 tablet by mouth daily.     cetirizine (ZYRTEC) 10 MG tablet Take 10 mg by mouth daily.     CORLANOR 7.5 MG TABS tablet TAKE 1 TABLET BY MOUTH TWICE A DAY WITH MEALS 60 tablet 3   cyclobenzaprine (FLEXERIL) 10 MG tablet      cycloSPORINE (RESTASIS) 0.05 % ophthalmic emulsion Place 1 drop into both eyes 2 (two) times daily.      FARXIGA 5 MG TABS tablet TAKE 1 TABLET BY MOUTH EVERY DAY 30 tablet 1   furosemide (LASIX) 20 MG tablet TAKE 1 TABLET BY MOUTH AS NEEDED 90 tablet 1   gabapentin (NEURONTIN) 300 MG capsule Take 1 capsule (300 mg total) by mouth 3 (three) times daily. 270 capsule 3   hyoscyamine (LEVSIN SL) 0.125 MG SL tablet Place 0.125 mg under the tongue every 4 (four) hours as needed for cramping.     ibuprofen (ADVIL) 600 MG tablet Take 1 tablet (600 mg total) by mouth every 6 (six) hours as needed for moderate pain. 30 tablet 0   ivabradine (CORLANOR) 5 MG TABS tablet      metoprolol succinate (TOPROL-XL) 50 MG 24 hr  tablet TAKE 1 TABLET BY MOUTH EVERY DAY 90 tablet 0   montelukast (SINGULAIR) 10 MG tablet Take 1 tablet by mouth daily.     Multiple Vitamin (MULTIVITAMIN) tablet Take 1 tablet by mouth daily.     Olopatadine HCl (PAZEO) 0.7 % SOLN      ondansetron (ZOFRAN-ODT) 4 MG disintegrating tablet ondansetron 4 mg disintegrating tablet  PLACE 1 TABLET ON THE TONGUE AND ALLOW TO DISSOLVE EVERY 8 HOURS AS NEEDED     Probiotic Product (PROBIOTIC PO) Take 1 capsule by mouth daily.     sacubitril-valsartan (ENTRESTO) 97-103 MG      selenium (SELENIMIN-200) 200 MCG TABS tablet Take 200 mcg by mouth daily.      silver sulfADIAZINE (SILVADENE) 1 % cream Apply 1 application topically 2 (two) times daily as needed (wounds/affected area(s) of skin.).      sodium chloride (OCEAN) 0.65 % SOLN nasal spray Place 1 spray into both nostrils as needed for congestion.     spironolactone (ALDACTONE) 50 MG tablet TAKE 1 TABLET BY MOUTH EVERY DAY 90 tablet 2   SUMAtriptan (IMITREX) 100 MG tablet Take 100 mg by mouth every 6 (six) hours as needed for migraine. May repeat in 2 hours if headache persists or recurs.      traMADol (  ULTRAM) 50 MG tablet 1 tablet as needed     No current facility-administered medications on file prior to visit.    Cardiovascular studies:  EKG 03/21/2021: Sinus rhythm 60 bpm with biventricular pacing  Echocardiogram 02/03/2020:  Left ventricle cavity is normal in size. Mild concentric hypertrophy of  the left ventricle. Mild global hypokinesis. LVEF 45-50%. Doppler evidence  of grade I (impaired) diastolic dysfunction, normal LAP.  Left atrial cavity is mildly dilated.  Estimated RA pressure 8 mmHg.  Compared to previous study 06/27/2019, LVEF has improved from 30-35% to  45-50%.    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  RV: 46/6  mmHg. RVEDP 11 mmHg PA 56/28 mmHg, mean PAP 40 mmHg PW: 27 mmHg LV 137/0 mmHg with LVEDP 36 mmHg  CO 5.2 L/min CI 2.2 L/min/m2  Recent labs: 10/17/2018: Glucose 88, BUN/Cr 14/1.08. EGFR 63. Na/K 139/4.6.  H/H 13.6/41.9. MCV 91. Platelets 388  12/2018: TSH 3.5 normal, free T4 0.78 normal Thyroperoxidase Ab 22 (elevated)   Review of Systems  Cardiovascular:  Positive for dyspnea on exertion (Mild, stable). Negative for chest pain, leg swelling, palpitations and syncope.      Objective:    Vitals:   03/21/21 1323  BP: 107/72  Pulse: 83  Resp: 16  Temp: 98.2 F (36.8 C)  SpO2: 95%      Physical Exam Vitals and nursing note reviewed.  Constitutional:      General: She is not in acute distress.    Appearance: She is obese.  Neck:     Vascular: No JVD.  Cardiovascular:     Rate and Rhythm: Normal rate and regular rhythm.     Heart sounds: Normal heart sounds. No murmur heard. Pulmonary:     Effort: Pulmonary effort is normal.     Breath sounds: Normal breath sounds. No wheezing or rales.  Musculoskeletal:     Right lower leg: No edema.     Left lower leg: No edema.        Assessment & Recommendations:   45 y/o Caucasian female with  nonischemic dilated cardiomyopathy, now s/p BiVICD placement, Hashimoto thyroiditis, systemic sclerosis, Sjogren's disease, Raynaud's syndrome, morbid obesity  Chronic systolic heart failure ([place]): Nonischemic cardiomyopathy. NYHA class II LVEF improved to 45 to 50% (01/2020) Currently on Entresto 97-103 mg bid, metoprolol succinate to 50 mg daily,  spironolactone to 50 mg daily, corlanor to 7.5 mg bid, Farxiga 5 mg daily, lasix 20 mg daily. Recent exertional dyspnea could be related to the weather and humidity. Nonetheless, will check echccsardiogram and proBNP. S/p Biv ICD.  She would like to continue monitoring with [facility]  Hypertension: Controlled      F/u in 3 months  Katoya Amato Esther Hardy, MD [facility] Cardiovascular.  [place] Pager: [phone] Office: [phone] If no answer Cell [phone]

## 2021-03-22 LAB — PRO B NATRIURETIC PEPTIDE: NT-Pro BNP: 39 pg/mL (ref 0–249)

## 2021-03-23 ENCOUNTER — Other Ambulatory Visit: Payer: Self-pay

## 2021-03-23 ENCOUNTER — Ambulatory Visit: Payer: Medicaid Other

## 2021-03-23 DIAGNOSIS — I428 Other cardiomyopathies: Secondary | ICD-10-CM

## 2021-03-24 ENCOUNTER — Ambulatory Visit: Payer: Medicaid Other | Admitting: Neurology

## 2021-04-22 ENCOUNTER — Other Ambulatory Visit: Payer: Self-pay | Admitting: Cardiology

## 2021-04-22 DIAGNOSIS — I428 Other cardiomyopathies: Secondary | ICD-10-CM

## 2021-04-22 DIAGNOSIS — I5022 Chronic systolic (congestive) heart failure: Secondary | ICD-10-CM

## 2021-05-10 ENCOUNTER — Ambulatory Visit (INDEPENDENT_AMBULATORY_CARE_PROVIDER_SITE_OTHER): Payer: Medicaid Other

## 2021-05-10 DIAGNOSIS — I428 Other cardiomyopathies: Secondary | ICD-10-CM

## 2021-05-10 LAB — CUP PACEART REMOTE DEVICE CHECK
Battery Remaining Longevity: 65 mo
Battery Remaining Percentage: 70 %
Battery Voltage: 2.98 V
Brady Statistic AP VP Percent: 11 %
Brady Statistic AP VS Percent: 1 %
Brady Statistic AS VP Percent: 88 %
Brady Statistic AS VS Percent: 1 %
Brady Statistic RA Percent Paced: 12 %
Date Time Interrogation Session: 20220920020016
HighPow Impedance: 74 Ohm
HighPow Impedance: 74 Ohm
Implantable Lead Implant Date: 20200306
Implantable Lead Implant Date: 20200306
Implantable Lead Implant Date: 20200306
Implantable Lead Location: 753858
Implantable Lead Location: 753859
Implantable Lead Location: 753860
Implantable Lead Model: 7122
Implantable Pulse Generator Implant Date: 20200306
Lead Channel Impedance Value: 1100 Ohm
Lead Channel Impedance Value: 510 Ohm
Lead Channel Impedance Value: 510 Ohm
Lead Channel Pacing Threshold Amplitude: 0.625 V
Lead Channel Pacing Threshold Amplitude: 0.625 V
Lead Channel Pacing Threshold Amplitude: 1.125 V
Lead Channel Pacing Threshold Pulse Width: 0.5 ms
Lead Channel Pacing Threshold Pulse Width: 0.5 ms
Lead Channel Pacing Threshold Pulse Width: 0.5 ms
Lead Channel Sensing Intrinsic Amplitude: 12 mV
Lead Channel Sensing Intrinsic Amplitude: 5 mV
Lead Channel Setting Pacing Amplitude: 1.625
Lead Channel Setting Pacing Amplitude: 2 V
Lead Channel Setting Pacing Amplitude: 2.125
Lead Channel Setting Pacing Pulse Width: 0.5 ms
Lead Channel Setting Pacing Pulse Width: 0.5 ms
Lead Channel Setting Sensing Sensitivity: 0.5 mV
Pulse Gen Serial Number: 9878925

## 2021-05-17 NOTE — Progress Notes (Signed)
Remote ICD transmission.   

## 2021-05-29 ENCOUNTER — Other Ambulatory Visit: Payer: Self-pay | Admitting: Cardiology

## 2021-05-29 DIAGNOSIS — I5022 Chronic systolic (congestive) heart failure: Secondary | ICD-10-CM

## 2021-06-06 ENCOUNTER — Ambulatory Visit: Payer: Medicaid Other | Admitting: Internal Medicine

## 2021-06-06 ENCOUNTER — Other Ambulatory Visit: Payer: Self-pay

## 2021-06-06 VITALS — BP 110/76 | HR 81 | Ht 65.0 in | Wt 326.8 lb

## 2021-06-06 DIAGNOSIS — E041 Nontoxic single thyroid nodule: Secondary | ICD-10-CM | POA: Diagnosis not present

## 2021-06-06 DIAGNOSIS — Z833 Family history of diabetes mellitus: Secondary | ICD-10-CM | POA: Diagnosis not present

## 2021-06-06 DIAGNOSIS — E063 Autoimmune thyroiditis: Secondary | ICD-10-CM

## 2021-06-06 LAB — T3, FREE: T3, Free: 5.9 pg/mL — ABNORMAL HIGH (ref 2.3–4.2)

## 2021-06-06 LAB — TSH: TSH: 3.58 u[IU]/mL (ref 0.35–5.50)

## 2021-06-06 LAB — HEMOGLOBIN A1C: Hgb A1c MFr Bld: 7.3 % — ABNORMAL HIGH (ref 4.6–6.5)

## 2021-06-06 LAB — T4, FREE: Free T4: 1.17 ng/dL (ref 0.60–1.60)

## 2021-06-06 MED ORDER — ENTRESTO 97-103 MG PO TABS: 1.0000 | ORAL_TABLET | Freq: Two times a day (BID) | ORAL | 5 refills | Status: DC

## 2021-06-06 NOTE — Progress Notes (Signed)
Patient ID: Haillie Radu, female   DOB: 09-30-1975, 45 y.o.   MRN: [id number]  This visit occurred during the SARS-CoV-2 public health emergency.  Safety protocols were in place, including screening questions prior to the visit, additional usage of staff PPE, and extensive cleaning of exam room while observing appropriate contact time as indicated for disinfecting solutions.   HPI  Zabria Liss is a 45 y.o.-year-old female, returning for f/u for Hashimoto's thyroiditis, thyroid nodule.  Last visit 1 year and 5 mo ago.  Interim history: She has pain and swelling in her feet >> difficult to exercise. She has more difficulty swallowing and some pressure - usually when she has flare ups of the other autoimmune disorders. Her heart function improved after placement of pacemaker and defibrillator before the pandemic. She has thirst, increased urination.  She has a family history of diabetes in mother, father, grandmother.  She wonders if she has prediabetes or diabetes. Tries to eat a healthy diet but cannot walk or exercise. She now walks with a cane.  Reviewed history: Pt. has been dx with thyroiditis based on the aspect of the thyroid at the time of a recent thyroid U/S: heterogenous aspect, c/w thyroiditis.   Afterwards, antithyroid antibodies also returned elevated.  Reviewed her TFTs Lab Results  Component Value Date   TSH 2.52 01/14/2020   TSH 3.54 12/27/2018   TSH 3.51 01/28/2018   TSH 0.21 (L) 03/30/2015   TSH 5.423 (H) 07/30/2014   FREET4 0.69 01/14/2020   FREET4 0.78 12/27/2018   FREET4 0.73 01/28/2018   FREET4 1.02 03/30/2015   FREET4 0.68 (L) 07/30/2014   Her TPO antibodies were elevated: Component     Latest Ref Rng & Units 01/14/2020  Thyroperoxidase Ab SerPl-aCnc     <9 IU/mL 13 (H)   Component     Latest Ref Rng & Units 11/11/2013 01/28/2018 12/27/2018  Thyroperoxidase Ab SerPl-aCnc     <9 IU/mL 74.0 (H) 26 (H) 22 (H)   In the past, she was taking Tri-iodine and  Tyrosine.  We stopped these.  As the TSH was slightly high in 07/2014, we started her on levothyroxine, but subsequent TSH is slightly suppressed.  We had to stop levothyroxine then.  She has now been off levothyroxine for approximately 6.5 years.   We started selenium 200 mcg daily in 2019 - felt better on this.    Pt denies: - feeling nodules in neck - hoarseness - dysphagia - choking - SOB with lying down  Tyroid ultrasound report (04/15/2013): Right thyroid lobe:  4.5 x 2.1 x 1.6 cm Left thyroid lobe:  4.5 x 1.6 x 2.1 cm Isthmus:  6 mm   Focal nodules:  Thyroid echotexture is diffusely heterogeneous. Thyroid morphology is diffusely lobular.   5 mm hyperechoic focus in the interpolar left lobe on image 24.   Otherwise, no measurable focal solid or cystic nodules seen.   Diffusely increased vascularity.   Lymphadenopathy:  None visualized.   IMPRESSION: Lobular thyroid diffusely with heterogeneous echotexture and suggestion of increased vascularity.  Appearance is nonspecific and could be related to thyroiditis or its sequela.  No FH of thyroid cancer. No h/o radiation tx to head or neck.  No recent contrast studies. No herbal supplements. No Biotin use. No recent steroids use.   She also has scleroderma, Sjogren's syndrome, fibromyalgia, Raynauds, also vitamin D deficiency (2000 IU daily)-all managed by her rheumatologist. She had a MRI, then lumbar puncture performed for R leg weakness with falls >>  no MS. In 2019, she was diagnosed with LBBB + NICMP (EF reduced, 30%), also pulmonary HTN per review of the results of her  catheterization.  On Entresto and Spironolactone.  Also on diuretic.  She had a pacemaker and a defibrillator placed. She had cardiac rehab.  She is a single mom.  ROS: + See HPI + Joint aches.  I reviewed pt's medications, allergies, PMH, social hx, family hx, and changes were documented in the history of present illness. Otherwise, unchanged from my  initial visit note.  Past Medical History:  Diagnosis Date   Anemia    Autoimmune disorder (HCC)    Bronchitis    hx -uses inhaler prn   Chronic kidney disease    kidney infections   Endometriosis    Fibromyalgia    GERD (gastroesophageal reflux disease)    occasional   Hashimoto thyroiditis, fibrous variant    Hashimoto's disease    Headache    migraines   History of kidney stones    Hypertension    IVCD (intraventricular conduction defect)    LBBB (left bundle branch block)    Nonischemic cardiomyopathy (HCC)    per stress test 12-17-2017  ef 14%;  per echo 12-13-2017 ef 21%   Peripheral vascular disease (HCC)    raynoids syndrome   Pneumonia    only x 1 - 2- 3 months ago   Pulmonary hypertension (HCC)    per cardiac cath 12-18-2017--- moderate WHO Grp II     Scleroderma (HCC)    Sjogren's syndrome (HCC)    Sleep apnea    recently dx 04/13/18, does not have CPAP machine yet.   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, NYHA class 3 [facility])    cardiologist-  dr Evlyn Clines patwardhan   Tendinitis    lower back   Thyroiditis    Past Surgical History:  Procedure Laterality Date   BIV ICD INSERTION CRT-D N/A 10/25/2018   Procedure: BIV ICD INSERTION CRT-D;  Surgeon: Marinus Maw, MD;  Location: [facility] INVASIVE CV LAB;  Service: Cardiovascular;  Laterality: N/A;   CESAREAN SECTION  2004   x 1   CHOLECYSTECTOMY     COLONOSCOPY     COLONOSCOPY WITH PROPOFOL N/A 07/08/2020   Procedure: COLONOSCOPY WITH PROPOFOL;  Surgeon: Kathi Der, MD;  Location: [facility] ENDOSCOPY;  Service: Gastroenterology;  Laterality: N/A;   CYSTOSCOPY     x 2 - bladder as a child   HYSTEROSCOPY WITH D & C N/A 04/16/2018   Procedure: DILATATION AND CURETTAGE /HYSTEROSCOPY;  Surgeon: Lavina Hamman, MD;  Location: [facility] ORS;  Service: Gynecology;  Laterality: N/A;   LAPAROSCOPIC LYSIS OF ADHESIONS  04/16/2018   Procedure: LAPAROSCOPIC LYSIS OF ADHESIONS;  Surgeon:  Lavina Hamman, MD;  Location: [facility] ORS;  Service: Gynecology;;   MANDIBLE FRACTURE SURGERY  1994   upper mandibular   POLYPECTOMY  07/08/2020   Procedure: POLYPECTOMY;  Surgeon: Kathi Der, MD;  Location: [facility] ENDOSCOPY;  Service: Gastroenterology;;   RIGHT/LEFT HEART CATH AND CORONARY ANGIOGRAPHY N/A 12/18/2017   Procedure: RIGHT/LEFT HEART CATH AND CORONARY ANGIOGRAPHY;  Surgeon: Elder Negus, MD;  Location: [facility] INVASIVE CV LAB;  Service: Cardiovascular;  Laterality: N/A;   UPPER GI ENDOSCOPY     WISDOM TOOTH EXTRACTION     Social History   Socioeconomic History   Marital status: Single    Spouse name: Not on file   Number of children: 1   Years of education: 52  Highest education level: Associate degree: occupational, Scientist, product/process development, or vocational program  Occupational History   Occupation: not working  Tobacco Use   Smoking status: Former    Packs/day: 0.50    Years: 8.00    Pack years: 4.00    Types: Cigarettes    Quit date: 12/10/2012    Years since quitting: 8.4   Smokeless tobacco: Never  Vaping Use   Vaping Use: Never used  Substance and Sexual Activity   Alcohol use: Yes    Comment: once a year   Drug use: No   Sexual activity: Yes    Birth control/protection: Pill  Other Topics Concern   Not on file  Social History Narrative   Lives with son, renting a room from her mother.  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     Education: college.    Right handed   1 coffee/tea  a day   Social Determinants of Health   Financial Resource Strain: Not on file  Food Insecurity: Not on file  Transportation Needs: Not on file  Physical Activity: Not on file  Stress: Not on file  Social Connections: Not on file  Intimate Partner Violence: Not on file   Current Outpatient Medications on File Prior to Visit  Medication Sig Dispense Refill   ADVAIR DISKUS 100-50 MCG/ACT AEPB 1 puff 2 (two) times daily.     albuterol  (PROVENTIL HFA;VENTOLIN HFA) 108 (90 Base) MCG/ACT inhaler Inhale 1 puff into the lungs every 4 (four) hours as needed for wheezing or shortness of breath.      Calcium-Magnesium-Zinc (CAL-MAG-ZINC PO) Take 1 tablet by mouth daily.     cetirizine (ZYRTEC) 10 MG tablet Take 10 mg by mouth daily.     CORLANOR 7.5 MG TABS tablet TAKE 1 TABLET BY MOUTH TWICE A DAY WITH MEALS 60 tablet 3   cyclobenzaprine (FLEXERIL) 10 MG tablet      cycloSPORINE (RESTASIS) 0.05 % ophthalmic emulsion Place 1 drop into both eyes 2 (two) times daily.      ENTRESTO 97-103 MG TAKE 1 TABLET BY MOUTH TWICE A DAY 60 tablet 5   FARXIGA 5 MG TABS tablet TAKE 1 TABLET BY MOUTH EVERY DAY 30 tablet 1   furosemide (LASIX) 20 MG tablet TAKE 1 TABLET BY MOUTH AS NEEDED 90 tablet 1   gabapentin (NEURONTIN) 300 MG capsule Take 1 capsule (300 mg total) by mouth 3 (three) times daily. 270 capsule 3   hyoscyamine (LEVSIN SL) 0.125 MG SL tablet Place 0.125 mg under the tongue every 4 (four) hours as needed for cramping.     ibuprofen (ADVIL) 600 MG tablet Take 1 tablet (600 mg total) by mouth every 6 (six) hours as needed for moderate pain. 30 tablet 0   ivabradine (CORLANOR) 5 MG TABS tablet      metoprolol succinate (TOPROL-XL) 50 MG 24 hr tablet TAKE 1 TABLET BY MOUTH EVERY DAY 90 tablet 0   montelukast (SINGULAIR) 10 MG tablet Take 1 tablet by mouth daily.     Multiple Vitamin (MULTIVITAMIN) tablet Take 1 tablet by mouth daily.     Olopatadine HCl (PAZEO) 0.7 % SOLN      ondansetron (ZOFRAN-ODT) 4 MG disintegrating tablet ondansetron 4 mg disintegrating tablet  PLACE 1 TABLET ON THE TONGUE AND ALLOW TO DISSOLVE EVERY 8 HOURS AS NEEDED     Probiotic Product (PROBIOTIC PO) Take 1 capsule by mouth daily.     selenium (  SELENIMIN-200) 200 MCG TABS tablet Take 200 mcg by mouth daily.      silver sulfADIAZINE (SILVADENE) 1 % cream Apply 1 application topically 2 (two) times daily as needed (wounds/affected area(s) of skin.).      sodium  chloride (OCEAN) 0.65 % SOLN nasal spray Place 1 spray into both nostrils as needed for congestion.     spironolactone (ALDACTONE) 50 MG tablet TAKE 1 TABLET BY MOUTH EVERY DAY 90 tablet 2   SUMAtriptan (IMITREX) 100 MG tablet Take 100 mg by mouth every 6 (six) hours as needed for migraine. May repeat in 2 hours if headache persists or recurs.      traMADol (ULTRAM) 50 MG tablet 1 tablet as needed     No current facility-administered medications on file prior to visit.   Allergies  Allergen Reactions   Sulfa Antibiotics Nausea And Vomiting   Family History  Problem Relation Age of Onset   Diabetes Mother        Type II   Heart disease Mother        stent surgery   Hypertension Mother    Hyperlipidemia Mother    Diabetes Father        Type II   Hypertension Father    Hyperlipidemia Father     PE: BP 110/76 (BP Location: Right Arm, Patient Position: Sitting, Cuff Size: Normal)   Pulse 81   Ht 5\' 5"  (1.651 m)   Wt (!) 326 lb 12.8 oz (148.2 kg)   SpO2 96%   BMI 54.38 kg/m  Body mass index is 54.38 kg/m. Wt Readings from Last 3 Encounters:  06/06/21 (!) 326 lb 12.8 oz (148.2 kg)  03/21/21 (!) 326 lb (147.9 kg)  03/04/21 (!) 325 lb (147.4 kg)   Constitutional: overweight, in NAD Eyes: PERRLA, EOMI, no exophthalmos ENT: moist mucous membranes, no thyromegaly, no neck masses felt on palpation no cervical lymphadenopathy Cardiovascular: RRR, No MRG, + B.  Ankle edema Respiratory: CTA B Gastrointestinal: abdomen soft, NT, ND, BS+ Musculoskeletal: no deformities, strength intact in all 4 Skin: moist, warm, no rashes Neurological: no tremor with outstretched hands, DTR normal in all 4  ASSESSMENT: 1. Hashimoto's thyroiditis  2.  Thyroid nodule  3.  History of diabetes  PLAN:  1. Patient with history of Hashimoto thyroiditis with mildly elevated TSH in the past after which we tried a low-dose levothyroxine.  However, after this, TSH became suppressed so we stopped  levothyroxine.  She has been off medication for several years now.  At last visit, TFTs were normal and TPO antibodies were improving, still slightly elevated.  She continues on selenium 200 mcg daily, which we use to help decrease her thyroid antibodies -At last visit she felt more pressure in her neck, possibly from Hashimoto's thyroiditis inflammation.  She continues to feel this, usually when her other autoimmune diseases are flaring up.  We discussed that this is definitely an indication of Hashimoto's thyroiditis-related inflammation.  Advised her to take NSAIDs around the time when this happens, but she tells me that she cannot take these for a long time due to her history of 3 diverticulitis episode in the past.  I advised her to only take 1 dose of ibuprofen, for example, and see if the pressure improves. -At today's visit, we will recheck her TFTs along with TPO antibodies.  We will see if she needs to start a low-dose levothyroxine -If we do need to start levothyroxine, she is aware about how to take it  correctly: every day, with water, at least 30 minutes before breakfast, separated by at least 4 hours from: - acid reflux medications - calcium - iron - multivitamins -If labs are normal, we will see her back in a year   2.  Thyroid nodule -She had more neck pressure at last visit but we discussed that this is unlikely from the small thyroid hypoechogenicity (which measured 5 mm on the ultrasound from 2014) -Please continue selenium to help decrease thyroid inflammation -No neck ultrasound needed for the hyperechogenic focus -at this visit, the neck pressure persists, but it is only occasional and only with flareup of her other autoimmune diseases  3.  Family history of diabetes -On both sides of the family -She has increased urination and thirst and also obesity class III -We will check an HbA1c today  Component     Latest Ref Rng & Units 06/06/2021  Thyroperoxidase Ab SerPl-aCnc      <9 IU/mL 37 (H)  Triiodothyronine,Free,Serum     2.3 - 4.2 pg/mL 5.9 (H)  T4,Free(Direct)     0.60 - 1.60 ng/dL 7.74  TSH     1.28 - 7.86 uIU/mL 3.58  Hemoglobin A1C     4.6 - 6.5 % 7.3 (H)  HbA1c in the diabetic range.  We will institute dietary changes and have her back in 3 months to recheck.  I will advise her to start checking blood sugars once a day, rotating check times. TPO antibodies are higher. TSH is normal.  Free T4 is slightly high.  No intervention is needed for now but we will repeat them at next visit.  Carlus Pavlov, MD PhD [facility] Endocrinology

## 2021-06-06 NOTE — Patient Instructions (Signed)
Please stop at the lab.  Continue Selenium 200 mcg daily.  Please come back for a follow-up appointment in 1 year.

## 2021-06-07 ENCOUNTER — Encounter: Payer: Self-pay | Admitting: Internal Medicine

## 2021-06-07 LAB — THYROID PEROXIDASE ANTIBODY: Thyroperoxidase Ab SerPl-aCnc: 37 IU/mL — ABNORMAL HIGH (ref <9)

## 2021-06-09 ENCOUNTER — Other Ambulatory Visit: Payer: Self-pay

## 2021-06-22 ENCOUNTER — Ambulatory Visit: Payer: Medicaid Other | Admitting: Cardiology

## 2021-07-02 ENCOUNTER — Other Ambulatory Visit: Payer: Self-pay | Admitting: Cardiology

## 2021-07-02 DIAGNOSIS — I5022 Chronic systolic (congestive) heart failure: Secondary | ICD-10-CM

## 2021-07-07 ENCOUNTER — Ambulatory Visit: Payer: Medicaid Other | Admitting: Cardiology

## 2021-07-07 ENCOUNTER — Other Ambulatory Visit: Payer: Self-pay | Admitting: Cardiology

## 2021-07-07 DIAGNOSIS — I428 Other cardiomyopathies: Secondary | ICD-10-CM

## 2021-07-07 DIAGNOSIS — I5022 Chronic systolic (congestive) heart failure: Secondary | ICD-10-CM

## 2021-07-22 ENCOUNTER — Ambulatory Visit: Payer: Medicaid Other | Admitting: Cardiology

## 2021-07-27 NOTE — Progress Notes (Deleted)
Patient is here for follow up visit.  Subjective:   @Patient  ID: Sarah English, female    DOB: [birth year], 45 y.o.   MRN: [id number]   No chief complaint on file.   HPI   45 y.o. Caucasian female with  nonischemic dilated cardiomyopathy, now s/p BiVICD placement, Hashimoto thyroiditis, systemic sclerosis, Sjogren's disease, Raynaud's syndrome, morbid obesity  Patient presents for 70-monthfollow-up of chronic systolic heart failure and hypertension.  Last office visit patient had been complaining of exertional dyspnea, therefore checked proBNP which was normal at 39 and repeat echocardiogram which revealed LVEF had further improved from 45-50% to 62% without significant abnormalities otherwise.***  Patient has recently had exertional dyspnea while working outside in the heat and humidity, for example, in her barn. She is compliant with her medical therapy.    Current Outpatient Medications on File Prior to Visit  Medication Sig Dispense Refill   ADVAIR DISKUS 100-50 MCG/ACT AEPB 1 puff 2 (two) times daily.     albuterol (PROVENTIL HFA;VENTOLIN HFA) 108 (90 Base) MCG/ACT inhaler Inhale 1 puff into the lungs every 4 (four) hours as needed for wheezing or shortness of breath.      Calcium-Magnesium-Zinc (CAL-MAG-ZINC PO) Take 1 tablet by mouth daily.     cetirizine (ZYRTEC) 10 MG tablet Take 10 mg by mouth daily.     CORLANOR 7.5 MG TABS tablet TAKE 1 TABLET BY MOUTH TWICE A DAY WITH MEALS 60 tablet 3   cyclobenzaprine (FLEXERIL) 10 MG tablet Take 10 mg by mouth. As needed     cycloSPORINE (RESTASIS) 0.05 % ophthalmic emulsion Place 1 drop into both eyes 2 (two) times daily.      FARXIGA 5 MG TABS tablet TAKE 1 TABLET BY MOUTH EVERY DAY 90 tablet 1   furosemide (LASIX) 20 MG tablet TAKE 1 TABLET BY MOUTH AS NEEDED 90 tablet 1   gabapentin (NEURONTIN) 300 MG capsule Take 1 capsule (300 mg total) by mouth 3 (three) times daily. 270 capsule 3   hyoscyamine (LEVSIN SL) 0.125 MG SL tablet  Place 0.125 mg under the tongue every 4 (four) hours as needed for cramping.     ibuprofen (ADVIL) 600 MG tablet Take 1 tablet (600 mg total) by mouth every 6 (six) hours as needed for moderate pain. (Patient not taking: Reported on 06/06/2021) 30 tablet 0   ivabradine (CORLANOR) 5 MG TABS tablet      metoprolol succinate (TOPROL-XL) 50 MG 24 hr tablet TAKE 1 TABLET BY MOUTH EVERY DAY 90 tablet 0   montelukast (SINGULAIR) 10 MG tablet Take 1 tablet by mouth daily.     Multiple Vitamin (MULTIVITAMIN) tablet Take 1 tablet by mouth daily.     Olopatadine HCl (PAZEO) 0.7 % SOLN      ondansetron (ZOFRAN-ODT) 4 MG disintegrating tablet ondansetron 4 mg disintegrating tablet  PLACE 1 TABLET ON THE TONGUE AND ALLOW TO DISSOLVE EVERY 8 HOURS AS NEEDED     Probiotic Product (PROBIOTIC PO) Take 1 capsule by mouth daily.     sacubitril-valsartan (ENTRESTO) 97-103 MG Take 1 tablet by mouth 2 (two) times daily. 60 tablet 5   selenium (SELENIMIN-200) 200 MCG TABS tablet Take 200 mcg by mouth daily.      silver sulfADIAZINE (SILVADENE) 1 % cream Apply 1 application topically 2 (two) times daily as needed (wounds/affected area(s) of skin.).      sodium chloride (OCEAN) 0.65 % SOLN nasal spray Place 1 spray into both nostrils as needed for congestion.  spironolactone (ALDACTONE) 50 MG tablet TAKE 1 TABLET BY MOUTH EVERY DAY 90 tablet 2   SUMAtriptan (IMITREX) 100 MG tablet Take 100 mg by mouth every 6 (six) hours as needed for migraine. May repeat in 2 hours if headache persists or recurs.      traMADol (ULTRAM) 50 MG tablet 1 tablet as needed     No current facility-administered medications on file prior to visit.    Cardiovascular studies: PCV ECHOCARDIOGRAM COMPLETE 03/23/2021 Left ventricle cavity is normal in size and wall thickness. Normal global wall motion. Normal LV systolic function with EF 62%. Normal diastolic filling pattern. Structurally normal trileaflet aortic valve with trace aortic  stenosis. No regurgitation. Compared to previous study on 02/03/2020, LVED has further improved from 45-50%. Trace aortic stenosis is new.  EKG 03/21/2021: Sinus rhythm 60 bpm with biventricular pacing  Echocardiogram 02/03/2020:  Left ventricle cavity is normal in size. Mild concentric hypertrophy of  the left ventricle. Mild global hypokinesis. LVEF 45-50%. Doppler evidence  of grade I (impaired) diastolic dysfunction, normal LAP.  Left atrial cavity is mildly dilated.  Estimated RA pressure 8 mmHg.  Compared to previous study 06/27/2019, LVEF has improved from 30-35% to  45-50%.    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  RV: 46/6 mmHg. RVEDP 11 mmHg PA 56/28 mmHg, mean PAP 40 mmHg PW: 27 mmHg LV 137/0 mmHg with LVEDP 36 mmHg  CO 5.2 L/min CI 2.2 L/min/m2  Recent labs: CMP Latest Ref Rng & Units 02/18/2020 10/17/2018 04/26/2018  Glucose 65 - 99 mg/dL 93 88 -  BUN 6 - 24 mg/dL 11 14 -  Creatinine 0.57 - 1.00 mg/dL 0.95 1.08(H) -  Sodium 134 - 144 mmol/L 142 139 -  Potassium 3.5 - 5.2 mmol/L 4.4 4.6 -  Chloride 96 - 106 mmol/L 103 100 -  CO2 20 - 29 mmol/L 27 22 -  Calcium 8.7 - 10.2 mg/dL 9.7 9.5 -  Total Protein 6.1 - 8.1 g/dL - - 7.4  Total Bilirubin 0.3 - 1.2 mg/dL - - -  Alkaline Phos 38 - 126 U/L - - -  AST 15 - 41 U/L - - -  ALT 0 - 44 U/L - - -   CBC Latest Ref Rng & Units 10/17/2018 04/12/2018 12/10/2017  WBC 3.4 - 10.8 x10E3/uL 6.3 6.7 6.1  Hemoglobin 11.1 - 15.9 g/dL 13.6 14.4 14.4  Hematocrit 34.0 - 46.6 % 41.9 43.6 44.1  Platelets 150 - 450 x10E3/uL 388 302 352   Lipid Panel  No results found for: CHOL, TRIG, HDL, CHOLHDL, VLDL, LDLCALC, LDLDIRECT HEMOGLOBIN A1C Lab Results  Component Value Date   HGBA1C 7.3 (H) 06/06/2021   TSH Recent Labs    06/06/21 1513  TSH 3.58   Other  labs: 10/17/2018: Glucose 88, BUN/Cr 14/1.08. EGFR 63. Na/K 139/4.6.  H/H 13.6/41.9. MCV 91. Platelets 388  12/2018: TSH 3.5 normal, free T4 0.78 normal Thyroperoxidase Ab 22 (elevated)   Review of Systems  Cardiovascular:  Positive for dyspnea on exertion (Mild, stable). Negative for chest pain, leg swelling, palpitations and syncope.      Objective:    There were no vitals filed for this visit.     Physical Exam Vitals and nursing note reviewed.  Constitutional:      General: She is not in acute distress.    Appearance: She is obese.  Neck:     Vascular:  No JVD.  Cardiovascular:     Rate and Rhythm: Normal rate and regular rhythm.     Heart sounds: Normal heart sounds. No murmur heard. Pulmonary:     Effort: Pulmonary effort is normal.     Breath sounds: Normal breath sounds. No wheezing or rales.  Musculoskeletal:     Right lower leg: No edema.     Left lower leg: No edema.      Assessment & Recommendations:   45 y.o. Caucasian female with  nonischemic dilated cardiomyopathy, now s/p BiVICD placement, Hashimoto thyroiditis, systemic sclerosis, Sjogren's disease, Raynaud's syndrome, morbid obesity *** Chronic systolic heart failure ([place]): Nonischemic cardiomyopathy. NYHA class II LVEF improved to 45 to 50% (01/2020) Currently on Entresto 97-103 mg bid, metoprolol succinate to 50 mg daily,  spironolactone to 50 mg daily, corlanor to 7.5 mg bid, Farxiga 5 mg daily, lasix 20 mg daily. Recent exertional dyspnea could be related to the weather and humidity. Nonetheless, will check echccsardiogram and proBNP. S/p Biv ICD.  She would like to continue monitoring with [facility]  Hypertension: Controlled      F/u in 3 months  Manish Esther Hardy, MD [facility] Pager: [phone] Office: [phone] If no answer Cell [phone]

## 2021-07-28 ENCOUNTER — Other Ambulatory Visit: Payer: Self-pay

## 2021-07-28 ENCOUNTER — Ambulatory Visit: Payer: Medicaid Other | Admitting: Student

## 2021-07-28 DIAGNOSIS — I5022 Chronic systolic (congestive) heart failure: Secondary | ICD-10-CM

## 2021-07-28 DIAGNOSIS — I428 Other cardiomyopathies: Secondary | ICD-10-CM

## 2021-07-28 DIAGNOSIS — I10 Essential (primary) hypertension: Secondary | ICD-10-CM

## 2021-07-29 LAB — BASIC METABOLIC PANEL
BUN/Creatinine Ratio: 12 (ref 9–23)
BUN: 13 mg/dL (ref 6–24)
CO2: 27 mmol/L (ref 20–29)
Calcium: 9.6 mg/dL (ref 8.7–10.2)
Chloride: 101 mmol/L (ref 96–106)
Creatinine, Ser: 1.11 mg/dL — ABNORMAL HIGH (ref 0.57–1.00)
Glucose: 120 mg/dL — ABNORMAL HIGH (ref 70–99)
Potassium: 4.7 mmol/L (ref 3.5–5.2)
Sodium: 141 mmol/L (ref 134–144)
eGFR: 62 mL/min/{1.73_m2} (ref >59)

## 2021-07-29 LAB — PRO B NATRIURETIC PEPTIDE: NT-Pro BNP: 40 pg/mL (ref 0–249)

## 2021-08-01 ENCOUNTER — Encounter: Payer: Self-pay | Admitting: Student

## 2021-08-01 ENCOUNTER — Ambulatory Visit: Payer: Medicaid Other | Admitting: Student

## 2021-08-01 ENCOUNTER — Other Ambulatory Visit: Payer: Self-pay

## 2021-08-01 VITALS — BP 129/78 | HR 76 | Temp 98.2°F | Ht 65.0 in | Wt 326.0 lb

## 2021-08-01 DIAGNOSIS — I428 Other cardiomyopathies: Secondary | ICD-10-CM

## 2021-08-01 DIAGNOSIS — I10 Essential (primary) hypertension: Secondary | ICD-10-CM

## 2021-08-01 DIAGNOSIS — I5022 Chronic systolic (congestive) heart failure: Secondary | ICD-10-CM

## 2021-08-01 NOTE — Progress Notes (Signed)
Patient is here for follow up visit.  Subjective:   _0  ID: Sarah English, female    DOB: [birth year], 45 y.o.   MRN: [id number]   Chief Complaint  Patient presents with   HFrEF   Follow-up   Results    HPI   45 y.o. Caucasian female with  nonischemic dilated cardiomyopathy, s/p BiVICD placement, Hashimoto thyroiditis, systemic sclerosis, Sjogren's disease, Raynaud's syndrome, morbid obesity, OSA compliant with CPAP.   Patient presents for 77-monthfollow-up of chronic systolic heart failure and hypertension.  Last office visit patient had been complaining of exertional dyspnea, therefore checked proBNP which was normal at 39 and repeat echocardiogram which revealed LVEF had further improved from 45-50% to 62% without significant abnormalities otherwise.  Patient has been experiencing worsening joint pain primarily in her hips knees and ankles which is currently limiting her physical activity.  She is in the process of establishing care with a new rheumatologist.  She continues to take Lasix 20 mg p.o. daily.  She does continue to have exertional dyspnea which is stable.  Current Outpatient Medications on File Prior to Visit  Medication Sig Dispense Refill   albuterol (PROVENTIL HFA;VENTOLIN HFA) 108 (90 Base) MCG/ACT inhaler Inhale 1 puff into the lungs every 4 (four) hours as needed for wheezing or shortness of breath.      Calcium-Magnesium-Zinc (CAL-MAG-ZINC PO) Take 1 tablet by mouth daily.     cetirizine (ZYRTEC) 10 MG tablet Take 10 mg by mouth daily.     CORLANOR 7.5 MG TABS tablet TAKE 1 TABLET BY MOUTH TWICE A DAY WITH MEALS 60 tablet 3   cyclobenzaprine (FLEXERIL) 10 MG tablet Take 10 mg by mouth. As needed     cycloSPORINE (RESTASIS) 0.05 % ophthalmic emulsion Place 1 drop into both eyes 2 (two) times daily.      FARXIGA 5 MG TABS tablet TAKE 1 TABLET BY MOUTH EVERY DAY 90 tablet 1   furosemide (LASIX) 20 MG tablet TAKE 1 TABLET BY MOUTH AS NEEDED 90 tablet 1    gabapentin (NEURONTIN) 300 MG capsule Take 1 capsule (300 mg total) by mouth 3 (three) times daily. 270 capsule 3   hyoscyamine (LEVSIN SL) 0.125 MG SL tablet Place 0.125 mg under the tongue every 4 (four) hours as needed for cramping.     ibuprofen (ADVIL) 600 MG tablet Take 1 tablet (600 mg total) by mouth every 6 (six) hours as needed for moderate pain. 30 tablet 0   metoprolol succinate (TOPROL-XL) 50 MG 24 hr tablet TAKE 1 TABLET BY MOUTH EVERY DAY 90 tablet 0   montelukast (SINGULAIR) 10 MG tablet Take 1 tablet by mouth daily.     Multiple Vitamin (MULTIVITAMIN) tablet Take 1 tablet by mouth daily.     ondansetron (ZOFRAN-ODT) 4 MG disintegrating tablet ondansetron 4 mg disintegrating tablet  PLACE 1 TABLET ON THE TONGUE AND ALLOW TO DISSOLVE EVERY 8 HOURS AS NEEDED     Probiotic Product (PROBIOTIC PO) Take 1 capsule by mouth daily.     sacubitril-valsartan (ENTRESTO) 97-103 MG Take 1 tablet by mouth 2 (two) times daily. 60 tablet 5   selenium (SELENIMIN-200) 200 MCG TABS tablet Take 200 mcg by mouth daily.      silver sulfADIAZINE (SILVADENE) 1 % cream Apply 1 application topically 2 (two) times daily as needed (wounds/affected area(s) of skin.).      sodium chloride (OCEAN) 0.65 % SOLN nasal spray Place 1 spray into both nostrils as needed for congestion.  spironolactone (ALDACTONE) 50 MG tablet TAKE 1 TABLET BY MOUTH EVERY DAY 90 tablet 2   SUMAtriptan (IMITREX) 100 MG tablet Take 100 mg by mouth every 6 (six) hours as needed for migraine. May repeat in 2 hours if headache persists or recurs.      traMADol (ULTRAM) 50 MG tablet 1 tablet as needed     Olopatadine HCl (PAZEO) 0.7 % SOLN  (Patient not taking: Reported on 08/01/2021)     No current facility-administered medications on file prior to visit.    Cardiovascular studies: PCV ECHOCARDIOGRAM COMPLETE 03/23/2021 Left ventricle cavity is normal in size and wall thickness. Normal global wall motion. Normal LV systolic function  with EF 62%. Normal diastolic filling pattern. Structurally normal trileaflet aortic valve with trace aortic stenosis. No regurgitation. Compared to previous study on 02/03/2020, LVED has further improved from 45-50%. Trace aortic stenosis is new.  EKG 03/21/2021: Sinus rhythm 60 bpm with biventricular pacing  Echocardiogram 02/03/2020:  Left ventricle cavity is normal in size. Mild concentric hypertrophy of  the left ventricle. Mild global hypokinesis. LVEF 45-50%. Doppler evidence  of grade I (impaired) diastolic dysfunction, normal LAP.  Left atrial cavity is mildly dilated.  Estimated RA pressure 8 mmHg.  Compared to previous study 06/27/2019, LVEF has improved from 30-35% to  45-50%.    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  RV: 46/6 mmHg. RVEDP 11 mmHg PA 56/28 mmHg, mean PAP 40 mmHg PW: 27 mmHg LV 137/0 mmHg with LVEDP 36 mmHg  CO 5.2 L/min CI 2.2 L/min/m2  Recent labs: CMP Latest Ref Rng & Units 07/28/2021 02/18/2020 10/17/2018  Glucose 70 - 99 mg/dL 120(H) 93 88  BUN 6 - 24 mg/dL _0 Creatinine 0.57 - 1.00 mg/dL 1.11(H) 0.95 1.08(H)  Sodium 134 - 144 mmol/L 141 142 139  Potassium 3.5 - 5.2 mmol/L 4.7 4.4 4.6  Chloride 96 - 106 mmol/L 101 103 100  CO2 20 - 29 mmol/L _1 Calcium 8.7 - 10.2 mg/dL 9.6 9.7 9.5  Total Protein 6.1 - 8.1 g/dL - - -  Total Bilirubin 0.3 - 1.2 mg/dL - - -  Alkaline Phos 38 - 126 U/L - - -  AST 15 - 41 U/L - - -  ALT 0 - 44 U/L - - -   CBC Latest Ref Rng & Units 10/17/2018 04/12/2018 12/10/2017  WBC 3.4 - 10.8 x10E3/uL 6.3 6.7 6.1  Hemoglobin 11.1 - 15.9 g/dL 13.6 14.4 14.4  Hematocrit 34.0 - 46.6 % 41.9 43.6 44.1  Platelets 150 - 450 x10E3/uL 388 302 352   Lipid Panel  No results found for: CHOL, TRIG, HDL, CHOLHDL, VLDL, LDLCALC, LDLDIRECT HEMOGLOBIN A1C Lab Results   Component Value Date   HGBA1C 7.3 (H) 06/06/2021   TSH Recent Labs    06/06/21 1513  TSH 3.58   Other labs: 10/17/2018: Glucose 88, BUN/Cr 14/1.08. EGFR 63. Na/K 139/4.6.  H/H 13.6/41.9. MCV 91. Platelets 388  12/2018: TSH 3.5 normal, free T4 0.78 normal Thyroperoxidase Ab 22 (elevated)   Review of Systems  Cardiovascular:  Positive for dyspnea on exertion (Mild, stable). Negative for chest pain, claudication, leg swelling, near-syncope, orthopnea, palpitations, paroxysmal nocturnal dyspnea and syncope.      Objective:    Vitals:   08/01/21 1450  BP: 129/78  Pulse: 76  Temp: 98.2 F (36.8 C)  SpO2: 98%  Physical Exam Vitals and nursing note reviewed.  Constitutional:      General: She is not in acute distress.    Appearance: She is obese.  Neck:     Vascular: No JVD.  Cardiovascular:     Rate and Rhythm: Normal rate and regular rhythm.     Pulses: Intact distal pulses.     Heart sounds: Normal heart sounds, S1 normal and S2 normal. No murmur heard.   No gallop.  Pulmonary:     Effort: Pulmonary effort is normal. No respiratory distress.     Breath sounds: Normal breath sounds. No wheezing, rhonchi or rales.  Musculoskeletal:     Right lower leg: No edema.     Left lower leg: No edema.  Physical exam unchanged compared to previous office visit.     Assessment & Recommendations:   45 y.o. Caucasian female with  nonischemic dilated cardiomyopathy, now s/p BiVICD placement, Hashimoto thyroiditis, systemic sclerosis, Sjogren's disease, Raynaud's syndrome, morbid obesity, OSA compliant with CPAP.  Chronic systolic heart failure [facility]): Nonischemic cardiomyopathy. NYHA class II LVEF improved to 62% (03/2021) Currently on Entresto 97-103 mg bid, metoprolol succinate to 50 mg daily,  spironolactone to 50 mg daily, corlanor to 7.5 mg bid, Farxiga 5 mg daily, lasix 20 mg daily. Suspect exertional dyspnea is related to deconditioning/physical inactivity as  echocardiogram is continue to improve and BNP at last office visit was within normal limits. S/p Biv ICD.  She would like to continue monitoring with [facility] Encourage patient to focus on treating joint pain and increasing physical activity as well as reducing calorie intake.  Discussed diet and lifestyle modifications.  Suspect dyspnea will improve as patient increase his physical activity.  Hypertension: Controlled      Follow-up in 6 months, sooner if needed, for cardiomyopathy   Alethia Berthold, PA-C 08/01/2021, 3:22 PM Office: [phone]

## 2021-08-09 ENCOUNTER — Ambulatory Visit (INDEPENDENT_AMBULATORY_CARE_PROVIDER_SITE_OTHER): Payer: Medicaid Other

## 2021-08-09 DIAGNOSIS — I428 Other cardiomyopathies: Secondary | ICD-10-CM | POA: Diagnosis not present

## 2021-08-09 LAB — CUP PACEART REMOTE DEVICE CHECK
Battery Remaining Longevity: 62 mo
Battery Remaining Percentage: 68 %
Battery Voltage: 2.98 V
Brady Statistic AP VP Percent: 11 %
Brady Statistic AP VS Percent: 1.1 %
Brady Statistic AS VP Percent: 87 %
Brady Statistic AS VS Percent: 1 %
Brady Statistic RA Percent Paced: 12 %
Date Time Interrogation Session: 20221220020015
HighPow Impedance: 72 Ohm
HighPow Impedance: 72 Ohm
Implantable Lead Implant Date: 20200306
Implantable Lead Implant Date: 20200306
Implantable Lead Implant Date: 20200306
Implantable Lead Location: 753858
Implantable Lead Location: 753859
Implantable Lead Location: 753860
Implantable Lead Model: 7122
Implantable Pulse Generator Implant Date: 20200306
Lead Channel Impedance Value: 1075 Ohm
Lead Channel Impedance Value: 480 Ohm
Lead Channel Impedance Value: 510 Ohm
Lead Channel Pacing Threshold Amplitude: 0.5 V
Lead Channel Pacing Threshold Amplitude: 0.5 V
Lead Channel Pacing Threshold Amplitude: 1.125 V
Lead Channel Pacing Threshold Pulse Width: 0.5 ms
Lead Channel Pacing Threshold Pulse Width: 0.5 ms
Lead Channel Pacing Threshold Pulse Width: 0.5 ms
Lead Channel Sensing Intrinsic Amplitude: 12 mV
Lead Channel Sensing Intrinsic Amplitude: 5 mV
Lead Channel Setting Pacing Amplitude: 1.5 V
Lead Channel Setting Pacing Amplitude: 2 V
Lead Channel Setting Pacing Amplitude: 2.125
Lead Channel Setting Pacing Pulse Width: 0.5 ms
Lead Channel Setting Pacing Pulse Width: 0.5 ms
Lead Channel Setting Sensing Sensitivity: 0.5 mV
Pulse Gen Serial Number: 9878925

## 2021-08-18 NOTE — Progress Notes (Signed)
Remote ICD transmission.   

## 2021-08-23 ENCOUNTER — Other Ambulatory Visit: Payer: Self-pay | Admitting: Cardiology

## 2021-09-06 ENCOUNTER — Ambulatory Visit: Payer: Medicaid Other | Admitting: Internal Medicine

## 2021-09-06 NOTE — Progress Notes (Deleted)
Patient ID: Tomasina Keasling, female   DOB: July 09, 1976, 46 y.o.   MRN: [id number]  This visit occurred during the SARS-CoV-2 public health emergency.  Safety protocols were in place, including screening questions prior to the visit, additional usage of staff PPE, and extensive cleaning of exam room while observing appropriate contact time as indicated for disinfecting solutions.   HPI  Katoria Yetman is a 46 y.o.-year-old female, returning for f/u for Hashimoto's thyroiditis, thyroid nodule, elevated HbA1c.  Last visit 3 months ago.  Interim history: She continues to have pain and swelling in her feet-difficult to exercise.  Hashimoto's thyroiditis: Reviewed history: Pt. has been dx with thyroiditis based on the aspect of the thyroid at the time of a recent thyroid U/S: heterogenous aspect, c/w thyroiditis.   Afterwards, antithyroid antibodies also returned elevated.  Reviewed her TFTs Lab Results  Component Value Date   TSH 3.58 06/06/2021   TSH 2.52 01/14/2020   TSH 3.54 12/27/2018   TSH 3.51 01/28/2018   TSH 0.21 (L) 03/30/2015   FREET4 1.17 06/06/2021   FREET4 0.69 01/14/2020   FREET4 0.78 12/27/2018   FREET4 0.73 01/28/2018   FREET4 1.02 03/30/2015   Her TPO antibodies were elevated: Component     Latest Ref Rng & Units 01/14/2020  Thyroperoxidase Ab SerPl-aCnc     <9 IU/mL 13 (H)   Component     Latest Ref Rng & Units 11/11/2013 01/28/2018 12/27/2018  Thyroperoxidase Ab SerPl-aCnc     <9 IU/mL 74.0 (H) 26 (H) 22 (H)   In the past, she was taking Tri-iodine and Tyrosine.  We stopped these. As the TSH was slightly high in 07/2014, we started her on levothyroxine, but subsequent TSH is slightly suppressed.  We had to stop levothyroxine then. She has now been off levothyroxine for several years.  We started selenium 200 mcg daily in 2019 - felt better on this.    Thyroid nodule: Pt denies: - feeling nodules in neck - hoarseness - dysphagia - choking - SOB with lying  down  Tyroid ultrasound report (04/15/2013): Right thyroid lobe:  4.5 x 2.1 x 1.6 cm Left thyroid lobe:  4.5 x 1.6 x 2.1 cm Isthmus:  6 mm   Focal nodules:  Thyroid echotexture is diffusely heterogeneous. Thyroid morphology is diffusely lobular.   5 mm hyperechoic focus in the interpolar left lobe on image 24.   Otherwise, no measurable focal solid or cystic nodules seen.   Diffusely increased vascularity.   Lymphadenopathy:  None visualized.   IMPRESSION: Lobular thyroid diffusely with heterogeneous echotexture and suggestion of increased vascularity.  Appearance is nonspecific and could be related to thyroiditis or its sequela.  No FH of thyroid cancer. No h/o radiation tx to head or neck.  No recent contrast studies. No herbal supplements. No Biotin use. No recent steroids use.   Elevated HbA1c: - At last visit, she complains of increased thirst and increased urination.   - She has a family history of diabetes in mother, father, grandmother.    We checked her HbA1c and this was elevated: Lab Results  Component Value Date   HGBA1C 7.3 (H) 06/06/2021   She is trying to eat a healthy diet but cannot walk for exercise.  She walks with a cane.  She is not checking blood sugars at home.  She also has scleroderma, Sjogren's syndrome, fibromyalgia, Raynauds, also vitamin D deficiency (2000 IU daily)-all managed by her rheumatologist. She had a MRI, then lumbar puncture performed for R leg weakness  with falls >> no MS. In 2019, she was diagnosed with LBBB + NICMP (EF reduced, 30%), also pulmonary HTN per review of the results of her  catheterization.  On Entresto and Spironolactone.  Also on diuretic.  She had a pacemaker and a defibrillator placed. She had cardiac rehab.  She is a single mom.  ROS: + See HPI + Joint aches.  I reviewed pt's medications, allergies, PMH, social hx, family hx, and changes were documented in the history of present illness. Otherwise, unchanged from  my initial visit note.  Past Medical History:  Diagnosis Date   Anemia    Autoimmune disorder (HCC)    Bronchitis    hx -uses inhaler prn   Chronic kidney disease    kidney infections   Endometriosis    Fibromyalgia    GERD (gastroesophageal reflux disease)    occasional   Hashimoto thyroiditis, fibrous variant    Hashimoto's disease    Headache    migraines   History of kidney stones    Hypertension    IVCD (intraventricular conduction defect)    LBBB (left bundle branch block)    Nonischemic cardiomyopathy (HCC)    per stress test 12-17-2017  ef 14%;  per echo 12-13-2017 ef 21%   Peripheral vascular disease (HCC)    raynoids syndrome   Pneumonia    only x 1 - 2- 3 months ago   Pulmonary hypertension (HCC)    per cardiac cath 12-18-2017--- moderate WHO Grp II     Scleroderma (HCC)    Sjogren's syndrome (HCC)    Sleep apnea    recently dx 04/13/18, does not have CPAP machine yet.   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, NYHA class 3 [facility])    cardiologist-  dr Evlyn Clines patwardhan   Tendinitis    lower back   Thyroiditis    Past Surgical History:  Procedure Laterality Date   BIV ICD INSERTION CRT-D N/A 10/25/2018   Procedure: BIV ICD INSERTION CRT-D;  Surgeon: Marinus Maw, MD;  Location: [facility] INVASIVE CV LAB;  Service: Cardiovascular;  Laterality: N/A;   CESAREAN SECTION  2004   x 1   CHOLECYSTECTOMY     COLONOSCOPY     COLONOSCOPY WITH PROPOFOL N/A 07/08/2020   Procedure: COLONOSCOPY WITH PROPOFOL;  Surgeon: Kathi Der, MD;  Location: [facility] ENDOSCOPY;  Service: Gastroenterology;  Laterality: N/A;   CYSTOSCOPY     x 2 - bladder as a child   HYSTEROSCOPY WITH D & C N/A 04/16/2018   Procedure: DILATATION AND CURETTAGE /HYSTEROSCOPY;  Surgeon: Lavina Hamman, MD;  Location: [facility] ORS;  Service: Gynecology;  Laterality: N/A;   LAPAROSCOPIC LYSIS OF ADHESIONS  04/16/2018   Procedure: LAPAROSCOPIC LYSIS OF ADHESIONS;  Surgeon:  Lavina Hamman, MD;  Location: [facility] ORS;  Service: Gynecology;;   MANDIBLE FRACTURE SURGERY  1994   upper mandibular   POLYPECTOMY  07/08/2020   Procedure: POLYPECTOMY;  Surgeon: Kathi Der, MD;  Location: [facility] ENDOS[facility];  Service: Gastroenterology;;   RIGHT/LEFT HEART CATH AND CORONARY ANGIOGRAPHY N/A 12/18/2017   Procedure: RIGHT/LEFT HEART CATH AND CORONARY ANGIOGRAPHY;  Surgeon: Elder Negus, MD;  Location: [facility] INVASIVE CV LAB;  Service: Cardiovascular;  Laterality: N/A;   UPPER GI ENDOSCOPY     WISDOM TOOTH EXTRACTION     Social History   Socioeconomic History   Marital status: Single    Spouse name: Not on file   Number of children: 1   Years  of education: 14   Highest education level: Associate degree: occupational, Scientist, product/process development, or vocational program  Occupational History   Occupation: not working  Tobacco Use   Smoking status: Former    Packs/day: 0.50    Years: 8.00    Pack years: 4.00    Types: Cigarettes    Quit date: 12/10/2012    Years since quitting: 8.7   Smokeless tobacco: Never  Vaping Use   Vaping Use: Never used  Substance and Sexual Activity   Alcohol use: Yes    Comment: once a year   Drug use: No   Sexual activity: Yes    Birth control/protection: Pill  Other Topics Concern   Not on file  Social History Narrative   Lives with son, renting a room from her mother.  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     Education: college.    Right handed   1 coffee/tea  a day   Social Determinants of Health   Financial Resource Strain: Not on file  Food Insecurity: Not on file  Transportation Needs: Not on file  Physical Activity: Not on file  Stress: Not on file  Social Connections: Not on file  Intimate Partner Violence: Not on file   Current Outpatient Medications on File Prior to Visit  Medication Sig Dispense Refill   albuterol (PROVENTIL HFA;VENTOLIN HFA) 108 (90 Base) MCG/ACT inhaler Inhale 1  puff into the lungs every 4 (four) hours as needed for wheezing or shortness of breath.      Calcium-Magnesium-Zinc (CAL-MAG-ZINC PO) Take 1 tablet by mouth daily.     cetirizine (ZYRTEC) 10 MG tablet Take 10 mg by mouth daily.     CORLANOR 7.5 MG TABS tablet TAKE 1 TABLET BY MOUTH TWICE A DAY WITH MEALS 60 tablet 3   cyclobenzaprine (FLEXERIL) 10 MG tablet Take 10 mg by mouth. As needed     cycloSPORINE (RESTASIS) 0.05 % ophthalmic emulsion Place 1 drop into both eyes 2 (two) times daily.      FARXIGA 5 MG TABS tablet TAKE 1 TABLET BY MOUTH EVERY DAY 90 tablet 1   furosemide (LASIX) 20 MG tablet TAKE 1 TABLET BY MOUTH EVERY DAY AS NEEDED 90 tablet 1   gabapentin (NEURONTIN) 300 MG capsule Take 1 capsule (300 mg total) by mouth 3 (three) times daily. 270 capsule 3   hyoscyamine (LEVSIN SL) 0.125 MG SL tablet Place 0.125 mg under the tongue every 4 (four) hours as needed for cramping.     ibuprofen (ADVIL) 600 MG tablet Take 1 tablet (600 mg total) by mouth every 6 (six) hours as needed for moderate pain. 30 tablet 0   metoprolol succinate (TOPROL-XL) 50 MG 24 hr tablet TAKE 1 TABLET BY MOUTH EVERY DAY 90 tablet 0   montelukast (SINGULAIR) 10 MG tablet Take 1 tablet by mouth daily.     Multiple Vitamin (MULTIVITAMIN) tablet Take 1 tablet by mouth daily.     Olopatadine HCl (PAZEO) 0.7 % SOLN  (Patient not taking: Reported on 08/01/2021)     ondansetron (ZOFRAN-ODT) 4 MG disintegrating tablet ondansetron 4 mg disintegrating tablet  PLACE 1 TABLET ON THE TONGUE AND ALLOW TO DISSOLVE EVERY 8 HOURS AS NEEDED     Probiotic Product (PROBIOTIC PO) Take 1 capsule by mouth daily.     sacubitril-valsartan (ENTRESTO) 97-103 MG Take 1 tablet by mouth 2 (two) times daily. 60 tablet 5   selenium (SELENIMIN-200) 200 MCG TABS tablet  Take 200 mcg by mouth daily.      silver sulfADIAZINE (SILVADENE) 1 % cream Apply 1 application topically 2 (two) times daily as needed (wounds/affected area(s) of skin.).       sodium chloride (OCEAN) 0.65 % SOLN nasal spray Place 1 spray into both nostrils as needed for congestion.     spironolactone (ALDACTONE) 50 MG tablet TAKE 1 TABLET BY MOUTH EVERY DAY 90 tablet 2   SUMAtriptan (IMITREX) 100 MG tablet Take 100 mg by mouth every 6 (six) hours as needed for migraine. May repeat in 2 hours if headache persists or recurs.      traMADol (ULTRAM) 50 MG tablet 1 tablet as needed     No current facility-administered medications on file prior to visit.   Allergies  Allergen Reactions   Sulfa Antibiotics Nausea And Vomiting   Family History  Problem Relation Age of Onset   Diabetes Mother        Type II   Heart disease Mother        stent surgery   Hypertension Mother    Hyperlipidemia Mother    Diabetes Father        Type II   Hypertension Father    Hyperlipidemia Father     PE: There were no vitals taken for this visit. There is no height or weight on file to calculate BMI. Wt Readings from Last 3 Encounters:  08/01/21 (!) 326 lb (147.9 kg)  06/06/21 (!) 326 lb 12.8 oz (148.2 kg)  03/21/21 (!) 326 lb (147.9 kg)   Constitutional: overweight, in NAD Eyes: PERRLA, EOMI, no exophthalmos ENT: moist mucous membranes, no thyromegaly, no neck masses felt on palpation no cervical lymphadenopathy Cardiovascular: RRR, No MRG, + B periankle edema Respiratory: CTA B Musculoskeletal: no deformities, strength intact in all 4 Skin: moist, warm, no rashes Neurological: no tremor with outstretched hands, DTR normal in all 4  ASSESSMENT: 1. Hashimoto's thyroiditis  2.  Thyroid nodule  3.  Elevated HbA1c  PLAN:  1. Patient with history of Hashimoto's thyroiditis with mildly elevated TSH in the past after which we tried a low-dose levothyroxine.  However, after this, TSH remains returned suppressed so we had to start levothyroxine.  TFTs were normal afterwards.  TPO antibodies were initially improving but at last visit they were higher.  She is still on selenium  200 mcg daily. -She has intermittent increased pressure in neck, likely due to Hashimoto's thyroid inflammation.  We discussed that this may be caused by an autoimmune disease flareup.  Advised her to take NSAIDs around the time when this happens.  However, she was telling me that she is not able to take this for a long time due to her history of diverticulitis episodes in the past.  I advised her to only take 1 dose of ibuprofen for example and see if the pressure improves. -At this visit, we will recheck her TFTs. -We discussed about how to take levothyroxine correctly in case we need to start: every day, with water, at least 30 minutes before breakfast, separated by at least 4 hours from: - acid reflux medications - calcium - iron - multivitamins  2.  Thyroid nodule -Previously neck pressure, but more likely to be related to her history of Hashimoto's thyroiditis rather than the small thyroid nodule (which measured 5 mm on the ultrasound from 2014) -We will continue with selenium to help decrease thyroid inflammation -No neck ultrasound needed for the hyperechogenic focus  3.  Elevated HbA1c -  We checked her HbA1c at last visit due to family history of diabetes on both sides of the family -Also, she had increased urination and thirst and also obesity class III -HbA1c remains at 1.3%.  At today's visit, we repeated his: ** -   Carlus Pavlov, MD PhD [facility]ocr[facility]

## 2021-09-16 ENCOUNTER — Ambulatory Visit: Payer: Medicaid Other | Admitting: Internal Medicine

## 2021-09-16 ENCOUNTER — Encounter: Payer: Self-pay | Admitting: Internal Medicine

## 2021-09-16 ENCOUNTER — Other Ambulatory Visit: Payer: Self-pay

## 2021-09-16 VITALS — BP 138/88 | HR 86 | Ht 65.0 in | Wt 327.0 lb

## 2021-09-16 DIAGNOSIS — R7309 Other abnormal glucose: Secondary | ICD-10-CM | POA: Diagnosis not present

## 2021-09-16 DIAGNOSIS — E063 Autoimmune thyroiditis: Secondary | ICD-10-CM | POA: Diagnosis not present

## 2021-09-16 DIAGNOSIS — E041 Nontoxic single thyroid nodule: Secondary | ICD-10-CM | POA: Diagnosis not present

## 2021-09-16 LAB — POCT GLYCOSYLATED HEMOGLOBIN (HGB A1C): Hemoglobin A1C: 7.1 % — AB (ref 4.0–5.6)

## 2021-09-16 MED ORDER — METFORMIN HCL 500 MG PO TABS: 1000.0000 mg | ORAL_TABLET | Freq: Two times a day (BID) | ORAL | 3 refills | Status: DC

## 2021-09-16 MED ORDER — FREESTYLE LIBRE 3 SENSOR MISC: 1.0000 | 11 refills | Status: DC

## 2021-09-16 NOTE — Progress Notes (Signed)
Patient ID: Elleanor Guyett, female   DOB: 30-Apr-1976, 46 y.o.   MRN: [id number]  This visit occurred during the SARS-CoV-2 public health emergency.  Safety protocols were in place, including screening questions prior to the visit, additional usage of staff PPE, and extensive cleaning of exam room while observing appropriate contact time as indicated for disinfecting solutions.   HPI  Dorotha Hirschi is a 46 y.o.-year-old female, returning for f/u for Hashimoto's thyroiditis, thyroid nodule, elevated HbA1c.  Last visit 3 months ago.  Interim history: She continues to have pain and swelling in her feet-difficult to exercise. She has dysphagia - more in last month and also a feeling of pressure midneck. She has no complaints otherwise.  Hashimoto's thyroiditis: Reviewed history: Pt. has been dx with thyroiditis based on the aspect of the thyroid at the time of a recent thyroid U/S: heterogenous aspect, c/w thyroiditis.   Afterwards, antithyroid antibodies also returned elevated.  Reviewed her TFTs Lab Results  Component Value Date   TSH 3.58 06/06/2021   TSH 2.52 01/14/2020   TSH 3.54 12/27/2018   TSH 3.51 01/28/2018   TSH 0.21 (L) 03/30/2015   FREET4 1.17 06/06/2021   FREET4 0.69 01/14/2020   FREET4 0.78 12/27/2018   FREET4 0.73 01/28/2018   FREET4 1.02 03/30/2015   Lab Results  Component Value Date   T3FREE 5.9 (H) 06/06/2021   T3FREE 3.1 01/14/2020   T3FREE 3.2 12/27/2018   T3FREE 3.5 01/28/2018   T3FREE 3.2 07/30/2014   T3FREE 2.3 03/26/2014   T3FREE 2.8 02/10/2014   T3FREE 3.1 11/11/2013    Her TPO antibodies were elevated: Component     Latest Ref Rng & Units 01/14/2020  Thyroperoxidase Ab SerPl-aCnc     <9 IU/mL 13 (H)   Component     Latest Ref Rng & Units 11/11/2013 01/28/2018 12/27/2018  Thyroperoxidase Ab SerPl-aCnc     <9 IU/mL 74.0 (H) 26 (H) 22 (H)   In the past, she was taking Tri-iodine and Tyrosine.  We stopped these. As the TSH was slightly high in 07/2014,  we started her on levothyroxine, but subsequent TSH is slightly suppressed.  We had to stop levothyroxine then. She has been off levothyroxine for several years.  We started selenium 200 mcg daily in 2019 - felt better on this.    Thyroid nodule:  Pt denies: - hoarseness  - choking but does have dysphagia and pressure in neck  Tyroid ultrasound report (04/15/2013): Right thyroid lobe:  4.5 x 2.1 x 1.6 cm Left thyroid lobe:  4.5 x 1.6 x 2.1 cm Isthmus:  6 mm   Focal nodules:  Thyroid echotexture is diffusely heterogeneous. Thyroid morphology is diffusely lobular.   5 mm hyperechoic focus in the interpolar left lobe on image 24.   Otherwise, no measurable focal solid or cystic nodules seen.   Diffusely increased vascularity.   Lymphadenopathy:  None visualized.   IMPRESSION: Lobular thyroid diffusely with heterogeneous echotexture and suggestion of increased vascularity.  Appearance is nonspecific and could be related to thyroiditis or its sequela.  No FH of thyroid cancer. No h/o radiation tx to head or neck.  No recent contrast studies. No herbal supplements. No Biotin use. No recent steroids use.   Elevated HbA1c: - At last visit, she complains of increased thirst and increased urination.   - She has a family history of diabetes in mother, father, grandmother.    We checked her HbA1c and this was elevated: Lab Results  Component Value Date  HGBA1C 7.3 (H) 06/06/2021   She is trying to eat a healthy diet but cannot walk for exercise.    After the above results returned, I recommended to start checking blood sugars once a day, rotating check times: - am: 120s-160, 298 - 2h after b'fast: 150s - lunch: 150-180 - 2h after lunch: n/c - dinner: 93, 112-138 - 2h after dinner: n/c - bedtime: cookie + tea  Reviewed BUN/creatinine: Lab Results  Component Value Date   BUN 13 07/28/2021   Lab Results  Component Value Date   CREATININE 1.11 (H) 07/28/2021   No lipid  panel available for review: No results found for: CHOL, HDL, LDLCALC, LDLDIRECT, TRIG, CHOLHDL  She is up-to-date with eye exams and has another one coming up.  She also has scleroderma, Sjogren's syndrome, fibromyalgia, Raynauds, also vitamin D deficiency (2000 IU daily)-all managed by her rheumatologist. She had a MRI, then lumbar puncture performed for R leg weakness with falls >> no MS. In 2019, she was diagnosed with LBBB + NICMP (EF reduced, 30%), also pulmonary HTN per review of the results of her  catheterization.  On Entresto and Spironolactone.  Also on diuretic.  She had a pacemaker and a defibrillator placed. She had cardiac rehab.  She is a single mom.  ROS: + See HPI + Joint aches.  I reviewed pt's medications, allergies, PMH, social hx, family hx, and changes were documented in the history of present illness. Otherwise, unchanged from my initial visit note.  Past Medical History:  Diagnosis Date   Anemia    Autoimmune disorder (HCC)    Bronchitis    hx -uses inhaler prn   Chronic kidney disease    kidney infections   Endometriosis    Fibromyalgia    GERD (gastroesophageal reflux disease)    occasional   Hashimoto thyroiditis, fibrous variant    Hashimoto's disease    Headache    migraines   History of kidney stones    Hypertension    IVCD (intraventricular conduction defect)    LBBB (left bundle branch block)    Nonischemic cardiomyopathy (HCC)    per stress test 12-17-2017  ef 14%;  per echo 12-13-2017 ef 21%   Peripheral vascular disease (HCC)    raynoids syndrome   Pneumonia    only x 1 - 2- 3 months ago   Pulmonary hypertension (HCC)    per cardiac cath 12-18-2017--- moderate WHO Grp II     Scleroderma (HCC)    Sjogren's syndrome (HCC)    Sleep apnea    recently dx 04/13/18, does not have CPAP machine yet.   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, NYHA class 3 [facility])    cardiologist-  dr Evlyn Clines patwardhan    Tendinitis    lower back   Thyroiditis    Past Surgical History:  Procedure Laterality Date   BIV ICD INSERTION CRT-D N/A 10/25/2018   Procedure: BIV ICD INSERTION CRT-D;  Surgeon: Marinus Maw, MD;  Location: [facility] INVASIVE CV LAB;  Service: Cardiovascular;  Laterality: N/A;   CESAREAN SECTION  2004   x 1   CHOLECYSTECTOMY     COLONOSCOPY     COLONOSCOPY WITH PROPOFOL N/A 07/08/2020   Procedure: COLONOSCOPY WITH PROPOFOL;  Surgeon: Kathi Der, MD;  Location: [facility] ENDOSCOPY;  Service: Gastroenterology;  Laterality: N/A;   CYSTOSCOPY     x 2 - bladder as a child   HYSTEROSCOPY WITH D & C N/A 04/16/2018  Procedure: DILATATION AND CURETTAGE /HYSTEROSCOPY;  Surgeon: Lavina Hamman, MD;  Location: [facility] ORS;  Service: Gynecology;  Laterality: N/A;   LAPAROSCOPIC LYSIS OF ADHESIONS  04/16/2018   Procedure: LAPAROSCOPIC LYSIS OF ADHESIONS;  Surgeon: Lavina Hamman, MD;  Location: [facility] ORS;  Service: Gynecology;;   MANDIBLE FRACTURE SURGERY  1994   upper mandibular   POLYPECTOMY  07/08/2020   Procedure: POLYPECTOMY;  Surgeon: Kathi Der, MD;  Location: [facility] ENDOSCOPY;  Service: Gastroenterology;;   RIGHT/LEFT HEART CATH AND CORONARY ANGIOGRAPHY N/A 12/18/2017   Procedure: RIGHT/LEFT HEART CATH AND CORONARY ANGIOGRAPHY;  Surgeon: Elder Negus, MD;  Location: [facility] INVASIVE CV LAB;  Service: Cardiovascular;  Laterality: N/A;   UPPER GI ENDOSCOPY     WISDOM TOOTH EXTRACTION     Social History   Socioeconomic History   Marital status: Single    Spouse name: Not on file   Number of children: 1   Years of education: 14   Highest education level: Associate degree: occupational, Scientist, product/process development, or vocational program  Occupational History   Occupation: not working  Tobacco Use   Smoking status: Former    Packs/day: 0.50    Years: 8.00    Pack years: 4.00    Types: Cigarettes    Quit date: 12/10/2012    Years since quitting: 8.7   Smokeless tobacco: Never  Vaping Use   Vaping Use:  Never used  Substance and Sexual Activity   Alcohol use: Yes    Comment: once a year   Drug use: No   Sexual activity: Yes    Birth control/protection: Pill  Other Topics Concern   Not on file  Social History Narrative   Lives with son, renting a room from her mother.  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     Education: college.    Right handed   1 coffee/tea  a day   Social Determinants of Health   Financial Resource Strain: Not on file  Food Insecurity: Not on file  Transportation Needs: Not on file  Physical Activity: Not on file  Stress: Not on file  Social Connections: Not on file  Intimate Partner Violence: Not on file   Current Outpatient Medications on File Prior to Visit  Medication Sig Dispense Refill   albuterol (PROVENTIL HFA;VENTOLIN HFA) 108 (90 Base) MCG/ACT inhaler Inhale 1 puff into the lungs every 4 (four) hours as needed for wheezing or shortness of breath.      Calcium-Magnesium-Zinc (CAL-MAG-ZINC PO) Take 1 tablet by mouth daily.     cetirizine (ZYRTEC) 10 MG tablet Take 10 mg by mouth daily.     CORLANOR 7.5 MG TABS tablet TAKE 1 TABLET BY MOUTH TWICE A DAY WITH MEALS 60 tablet 3   cyclobenzaprine (FLEXERIL) 10 MG tablet Take 10 mg by mouth. As needed     cycloSPORINE (RESTASIS) 0.05 % ophthalmic emulsion Place 1 drop into both eyes 2 (two) times daily.      FARXIGA 5 MG TABS tablet TAKE 1 TABLET BY MOUTH EVERY DAY 90 tablet 1   furosemide (LASIX) 20 MG tablet TAKE 1 TABLET BY MOUTH EVERY DAY AS NEEDED 90 tablet 1   gabapentin (NEURONTIN) 300 MG capsule Take 1 capsule (300 mg total) by mouth 3 (three) times daily. 270 capsule 3   hyoscyamine (LEVSIN SL) 0.125 MG SL tablet Place 0.125 mg under the tongue every 4 (four) hours as needed for cramping.     ibuprofen (ADVIL)  600 MG tablet Take 1 tablet (600 mg total) by mouth every 6 (six) hours as needed for moderate pain. 30 tablet 0   metoprolol succinate  (TOPROL-XL) 50 MG 24 hr tablet TAKE 1 TABLET BY MOUTH EVERY DAY 90 tablet 0   montelukast (SINGULAIR) 10 MG tablet Take 1 tablet by mouth daily.     Multiple Vitamin (MULTIVITAMIN) tablet Take 1 tablet by mouth daily.     Olopatadine HCl (PAZEO) 0.7 % SOLN  (Patient not taking: Reported on 08/01/2021)     ondansetron (ZOFRAN-ODT) 4 MG disintegrating tablet ondansetron 4 mg disintegrating tablet  PLACE 1 TABLET ON THE TONGUE AND ALLOW TO DISSOLVE EVERY 8 HOURS AS NEEDED     Probiotic Product (PROBIOTIC PO) Take 1 capsule by mouth daily.     sacubitril-valsartan (ENTRESTO) 97-103 MG Take 1 tablet by mouth 2 (two) times daily. 60 tablet 5   selenium (SELENIMIN-200) 200 MCG TABS tablet Take 200 mcg by mouth daily.      silver sulfADIAZINE (SILVADENE) 1 % cream Apply 1 application topically 2 (two) times daily as needed (wounds/affected area(s) of skin.).      sodium chloride (OCEAN) 0.65 % SOLN nasal spray Place 1 spray into both nostrils as needed for congestion.     spironolactone (ALDACTONE) 50 MG tablet TAKE 1 TABLET BY MOUTH EVERY DAY 90 tablet 2   SUMAtriptan (IMITREX) 100 MG tablet Take 100 mg by mouth every 6 (six) hours as needed for migraine. May repeat in 2 hours if headache persists or recurs.      traMADol (ULTRAM) 50 MG tablet 1 tablet as needed     No current facility-administered medications on file prior to visit.   Allergies  Allergen Reactions   Sulfa Antibiotics Nausea And Vomiting   Family History  Problem Relation Age of Onset   Diabetes Mother        Type II   Heart disease Mother        stent surgery   Hypertension Mother    Hyperlipidemia Mother    Diabetes Father        Type II   Hypertension Father    Hyperlipidemia Father     PE: BP 138/88 (BP Location: Right Wrist, Patient Position: Sitting, Cuff Size: Normal)    Pulse 86    Ht  (1.651 m)    Wt (!) 327 lb (148.3 kg)    SpO2 96%    BMI 54.42 kg/m  Body mass index is 54.42 kg/m. Wt Readings from Last  3 Encounters:  09/16/21 (!) 327 lb (148.3 kg)  08/01/21 (!) 326 lb (147.9 kg)  06/06/21 (!) 326 lb 12.8 oz (148.2 kg)   Constitutional: overweight, in NAD Eyes: PERRLA, EOMI, no exophthalmos ENT: moist mucous membranes, no thyromegaly, no neck masses felt on palpation no cervical lymphadenopathy Cardiovascular: RRR, No MRG, + B periankle edema Respiratory: CTA B Musculoskeletal: no deformities, strength intact in all 4 Skin: moist, warm, no rashes Neurological: no tremor with outstretched hands, DTR normal in all 4  ASSESSMENT: 1. Hashimoto's thyroiditis  2.  Thyroid nodule  3.  Type 2 diabetes - FH of DM  PLAN:  1. Patient with history of Hashimoto's thyroiditis with mildly elevated TSH in the past, after which we tried a low-dose levothyroxine.  However, for this, TSH was suppressed so we had to stop levothyroxine.  TFTs were normal afterwards.  TPO antibodies were initially improving, but at last visit, they increased.  She continues on selenium  200 mcg daily. -She has intermittent increased pressure in neck, most likely due to Hashimoto's thyroid inflammation.  We discussed that this may be caused by an autoimmune disease flareup.  Advised her to take NSAIDs around the time when this happens.  However, she was telling me that she was not able to take this for a long time due to her history of diverticulitis episode in the past.  I advised her to only take 1 dose of ibuprofen, for example, and see if the pressure improved. -At last visit, TSH and free T4 was normal, while the free T3 was slightly high.  I explained that this is the most fluctuating hormone and this is not unusual. -At this visit, we will recheck her TFTs. -We discussed about how to take levothyroxine correctly in case we need to start: every day, with water, at least 30 minutes before breakfast, separated by at least 4 hours from: - acid reflux medications - calcium - iron - multivitamins  2.  Thyroid nodule -She  previously had neck pressure, but more likely be related to her history of Hashimoto's thyroiditis rather than the small thyroid nodule (which measured 5 mm on the ultrasound from 2014) -We will continue selenium to help decrease thyroid inflammation -At today's visit, she again describes dysphagia and pressure in neck.  We will check another thyroid ultrasound since the last was 9 years ago.  If this does not show tracheal obstruction, may need a barium swallow study.  3.  Type 2 diabetes -We checked an HbA1c at last visit due to family history of diabetes - on both sides of the family -Also, she had increased urination and thirst and also obesity class III -HbA1c at last visit was high, at 7.3% we discussed that we needed another HbA1c to confirm the diagnosis of diabetes.  We checked this today: 7.2%, confirming a diagnosis of type 2 diabetes -We discussed about the importance of improving diet, strongly recommended to stop these at night but given further instructions, also -We will also start metformin at a low dose and increase as tolerated -Discussed about targets for blood sugars I advised him to volume Patient Instructions  Please start Metformin 500 mg with dinner x 4 days. If you tolerate this well, add another Metformin tablet (500 mg) with breakfast x 4 days. If you tolerate this well, add another metformin tablet with dinner (total 1000 mg) x 4 days. If you tolerate this well, add another metformin tablet with breakfast (total 1000 mg). Continue with 1000 mg of metformin 2x a day with breakfast and dinner.   No snacks.   We will order a new thyroid U/S.  Continue Selenium 200 mcg daily.  Please come back for a follow-up appointment in 3-4 months.   Carlus Pavlov, MD PhD [facility] Endocrinology

## 2021-09-16 NOTE — Patient Instructions (Addendum)
Please start Metformin 500 mg with dinner x 4 days. If you tolerate this well, add another Metformin tablet (500 mg) with breakfast x 4 days. If you tolerate this well, add another metformin tablet with dinner (total 1000 mg) x 4 days. If you tolerate this well, add another metformin tablet with breakfast (total 1000 mg). Continue with 1000 mg of metformin 2x a day with breakfast and dinner.   No snacks.   We will order a new thyroid U/S.  Continue Selenium 200 mcg daily.  Please come back for a follow-up appointment in 3-4 months.  PATIENT INSTRUCTIONS FOR TYPE 2 DIABETES:  DIET AND EXERCISE Diet and exercise is an important part of diabetic treatment.  We recommended aerobic exercise in the form of brisk walking (working between 40-60% of maximal aerobic capacity, similar to brisk walking) for 150 minutes per week (such as 30 minutes five days per week) along with 3 times per week performing 'resistance' training (using various gauge rubber tubes with handles) 5-10 exercises involving the major muscle groups (upper body, lower body and core) performing 10-15 repetitions (or near fatigue) each exercise. Start at half the above goal but build slowly to reach the above goals. If limited by weight, joint pain, or disability, we recommend daily walking in a swimming pool with water up to waist to reduce pressure from joints while allow for adequate exercise.    BLOOD GLUCOSES Monitoring your blood glucoses is important for continued management of your diabetes. Please check your blood glucoses 2-4 times a day: fasting, before meals and at bedtime (you can rotate these measurements - e.g. one day check before the 3 meals, the next day check before 2 of the meals and before bedtime, etc.).   HYPOGLYCEMIA (low blood sugar) Hypoglycemia is usually a reaction to not eating, exercising, or taking too much insulin/ other diabetes drugs.  Symptoms include tremors, sweating, hunger, confusion, headache,  etc. Treat IMMEDIATELY with 15 grams of Carbs: 4 glucose tablets  cup regular juice/soda 2 tablespoons raisins 4 teaspoons sugar 1 tablespoon honey Recheck blood glucose in 15 mins and repeat above if still symptomatic/blood glucose <100.  RECOMMENDATIONS TO REDUCE YOUR RISK OF DIABETIC COMPLICATIONS: * Take your prescribed MEDICATION(S) * Follow a DIABETIC diet: Complex carbs, fiber rich foods, (monounsaturated and polyunsaturated) fats * AVOID saturated/trans fats, high fat foods, >2,300 mg salt per day. * EXERCISE at least 5 times a week for 30 minutes or preferably daily.  * DO NOT SMOKE OR DRINK more than 1 drink a day. * Check your FEET every day. Do not wear tightfitting shoes. Contact us if you develop an ulcer * See your EYE doctor once a year or more if needed * Get a FLU shot once a year * Get a PNEUMONIA vaccine once before and once after age 15 years  GOALS:  * Your Hemoglobin A1c of <7%  * fasting sugars need to be <130 * after meals sugars need to be <180 (2h after you start eating) * Your Systolic BP should be 140 or lower  * Your Diastolic BP should be 80 or lower  * Your HDL (Good Cholesterol) should be 40 or higher  * Your LDL (Bad Cholesterol) should be 100 or lower. * Your Triglycerides should be 150 or lower  * Your Urine microalbumin (kidney function) should be <30 * Your Body Mass Index should be 25 or lower    Please consider the following ways to cut down carbs and fat and increase  fiber and micronutrients in your diet: - substitute whole grain for white bread or pasta - substitute brown rice for white rice - substitute 90-calorie flat bread pieces for slices of bread when possible - substitute sweet potatoes or yams for white potatoes - substitute humus for margarine - substitute tofu for cheese when possible - substitute almond or rice milk for regular milk (would not drink soy milk daily due to concern for soy estrogen influence on breast cancer  risk) - substitute dark chocolate for other sweets when possible - substitute water - can add lemon or orange slices for taste - for diet sodas (artificial sweeteners will trick your body that you can eat sweets without getting calories and will lead you to overeating and weight gain in the long run) - do not skip breakfast or other meals (this will slow down the metabolism and will result in more weight gain over time)  - can try smoothies made from fruit and almond/rice milk in am instead of regular breakfast - can also try old-fashioned (not instant) oatmeal made with almond/rice milk in am - order the dressing on the side when eating salad at a restaurant (pour less than half of the dressing on the salad) - eat as little meat as possible - can try juicing, but should not forget that juicing will get rid of the fiber, so would alternate with eating raw veg./fruits or drinking smoothies - use as little oil as possible, even when using olive oil - can dress a salad with a mix of balsamic vinegar and lemon juice, for e.g. - use agave nectar, stevia sugar, or regular sugar rather than artificial sweateners - steam or broil/roast veggies  - snack on veggies/fruit/nuts (unsalted, preferably) when possible, rather than processed foods - reduce or eliminate aspartame in diet (it is in diet sodas, chewing gum, etc) Read the labels!  Try to read Dr. Katherina Right book: "Program for Reversing Diabetes" for other ideas for healthy eating.

## 2021-09-26 ENCOUNTER — Other Ambulatory Visit: Payer: Medicaid Other

## 2021-09-30 ENCOUNTER — Ambulatory Visit
Admission: RE | Admit: 2021-09-30 | Discharge: 2021-09-30 | Disposition: A | Discharge status: Home / Self Care | Payer: Medicaid Other | Source: Ambulatory Visit | Attending: Internal Medicine | Admitting: Internal Medicine

## 2021-09-30 DIAGNOSIS — E041 Nontoxic single thyroid nodule: Secondary | ICD-10-CM

## 2021-11-08 ENCOUNTER — Ambulatory Visit (INDEPENDENT_AMBULATORY_CARE_PROVIDER_SITE_OTHER): Payer: Medicaid Other

## 2021-11-08 DIAGNOSIS — I428 Other cardiomyopathies: Secondary | ICD-10-CM

## 2021-11-09 LAB — CUP PACEART REMOTE DEVICE CHECK
Battery Remaining Longevity: 60 mo
Battery Remaining Percentage: 65 %
Battery Voltage: 2.96 V
Brady Statistic AP VP Percent: 11 %
Brady Statistic AP VS Percent: 1 %
Brady Statistic AS VP Percent: 88 %
Brady Statistic AS VS Percent: 1 %
Brady Statistic RA Percent Paced: 12 %
Date Time Interrogation Session: 20230321020015
HighPow Impedance: 78 Ohm
HighPow Impedance: 78 Ohm
Implantable Lead Implant Date: 20200306
Implantable Lead Implant Date: 20200306
Implantable Lead Implant Date: 20200306
Implantable Lead Location: 753858
Implantable Lead Location: 753859
Implantable Lead Location: 753860
Implantable Lead Model: 7122
Implantable Pulse Generator Implant Date: 20200306
Lead Channel Impedance Value: 1100 Ohm
Lead Channel Impedance Value: 510 Ohm
Lead Channel Impedance Value: 510 Ohm
Lead Channel Pacing Threshold Amplitude: 0.5 V
Lead Channel Pacing Threshold Amplitude: 0.625 V
Lead Channel Pacing Threshold Amplitude: 1.125 V
Lead Channel Pacing Threshold Pulse Width: 0.5 ms
Lead Channel Pacing Threshold Pulse Width: 0.5 ms
Lead Channel Pacing Threshold Pulse Width: 0.5 ms
Lead Channel Sensing Intrinsic Amplitude: 12 mV
Lead Channel Sensing Intrinsic Amplitude: 5 mV
Lead Channel Setting Pacing Amplitude: 1.625
Lead Channel Setting Pacing Amplitude: 2 V
Lead Channel Setting Pacing Amplitude: 2.125
Lead Channel Setting Pacing Pulse Width: 0.5 ms
Lead Channel Setting Pacing Pulse Width: 0.5 ms
Lead Channel Setting Sensing Sensitivity: 0.5 mV
Pulse Gen Serial Number: 9878925

## 2021-11-23 NOTE — Progress Notes (Signed)
Remote ICD transmission.   

## 2021-12-01 ENCOUNTER — Other Ambulatory Visit: Payer: Self-pay | Admitting: Cardiology

## 2021-12-01 DIAGNOSIS — I5022 Chronic systolic (congestive) heart failure: Secondary | ICD-10-CM

## 2021-12-16 ENCOUNTER — Ambulatory Visit: Payer: Medicaid Other | Admitting: Internal Medicine

## 2021-12-20 NOTE — Progress Notes (Signed)
External labs 12/13/2021: ?Total cholesterol 174, triglycerides 209, HDL 43, LDL 95 ?BUN 13, creatinine 0.92, GFR >60, sodium 144, potassium 5.4, ?Hgb 15.3, HCT 45.5, MCV 91, platelets 357 ?A1c 6.6%

## 2021-12-29 ENCOUNTER — Encounter: Payer: Self-pay | Admitting: Internal Medicine

## 2021-12-29 ENCOUNTER — Other Ambulatory Visit: Payer: Self-pay | Admitting: Cardiology

## 2021-12-29 ENCOUNTER — Ambulatory Visit: Payer: Medicaid Other | Admitting: Internal Medicine

## 2021-12-29 VITALS — BP 120/80 | HR 89 | Ht 65.0 in | Wt 323.8 lb

## 2021-12-29 DIAGNOSIS — E041 Nontoxic single thyroid nodule: Secondary | ICD-10-CM | POA: Diagnosis not present

## 2021-12-29 DIAGNOSIS — E038 Other specified hypothyroidism: Secondary | ICD-10-CM

## 2021-12-29 DIAGNOSIS — I428 Other cardiomyopathies: Secondary | ICD-10-CM

## 2021-12-29 DIAGNOSIS — E1165 Type 2 diabetes mellitus with hyperglycemia: Secondary | ICD-10-CM | POA: Diagnosis not present

## 2021-12-29 DIAGNOSIS — E063 Autoimmune thyroiditis: Secondary | ICD-10-CM

## 2021-12-29 DIAGNOSIS — I5022 Chronic systolic (congestive) heart failure: Secondary | ICD-10-CM

## 2021-12-29 LAB — T4, FREE: Free T4: 0.62 ng/dL (ref 0.60–1.60)

## 2021-12-29 LAB — T3, FREE: T3, Free: 3.1 pg/mL (ref 2.3–4.2)

## 2021-12-29 LAB — TSH: TSH: 8.55 u[IU]/mL — ABNORMAL HIGH (ref 0.35–5.50)

## 2021-12-29 MED ORDER — METFORMIN HCL 500 MG PO TABS: 500.0000 mg | ORAL_TABLET | Freq: Two times a day (BID) | ORAL | 3 refills | Status: DC

## 2021-12-29 MED ORDER — TIRZEPATIDE 5 MG/0.5ML ~~LOC~~ SOAJ: 5.0000 mg | SUBCUTANEOUS | 1 refills | Status: DC

## 2021-12-29 MED ORDER — TIRZEPATIDE 2.5 MG/0.5ML ~~LOC~~ SOAJ: 2.5000 mg | SUBCUTANEOUS | 1 refills | Status: DC

## 2021-12-29 NOTE — Patient Instructions (Addendum)
Please continue : ?- Metformin 500 mg 2x a day with breakfast and dinner.  ? ?Try to start: ?- Mounjaro 2.5 mg weekly ? ?Please increase to 5 mg weekly after 1 mo if tolerate it.  ? ?Please come back for a follow-up appointment in 6 months. ?

## 2021-12-29 NOTE — Progress Notes (Addendum)
Patient ID: Sarah English, female   DOB: May 17, 1976, 46 y.o.   MRN: [id number] ? ?This visit occurred during the SARS-CoV-2 public health emergency.  Safety protocols were in place, including screening questions prior to the visit, additional usage of staff PPE, and extensive cleaning of exam room while observing appropriate contact time as indicated for disinfecting solutions.  ? ?HPI  ?Sarah English is a 46 y.o.-year-old female, returning for f/u for Hashimoto's thyroiditis, thyroid nodule, DM2, dx'ed 08/2021, non-insulin-dependent.  Last visit 3.5 months ago. ? ?Interim history: ?She continues to have pain and swelling in her feet-difficult to exercise.  She would like to lose weight and inquires about Mounjaro.  She mentions that if this is not covered by her insurance, her father will help her pay for it. ? ?Hashimoto's thyroiditis: ?Reviewed history: ?Pt. has been dx with thyroiditis based on the aspect of the thyroid at the time of a thyroid U/S: heterogenous aspect, c/w thyroiditis.  ?Afterwards, antithyroid antibodies also returned elevated. ? ?Reviewed her TFTs ?Lab Results  ?Component Value Date  ? TSH 3.58 06/06/2021  ? TSH 2.52 01/14/2020  ? TSH 3.54 12/27/2018  ? TSH 3.51 01/28/2018  ? TSH 0.21 (L) 03/30/2015  ? FREET4 1.17 06/06/2021  ? FREET4 0.69 01/14/2020  ? FREET4 0.78 12/27/2018  ? FREET4 0.73 01/28/2018  ? FREET4 1.02 03/30/2015  ? ?Lab Results  ?Component Value Date  ? T3FREE 5.9 (H) 06/06/2021  ? T3FREE 3.1 01/14/2020  ? T3FREE 3.2 12/27/2018  ? T3FREE 3.5 01/28/2018  ? T3FREE 3.2 07/30/2014  ? T3FREE 2.3 03/26/2014  ? T3FREE 2.8 02/10/2014  ? T3FREE 3.1 11/11/2013  ? ?Her TPO antibodies were elevated: ?Component ?    Latest Ref Rng & Units 01/14/2020  ?Thyroperoxidase Ab SerPl-aCnc ?    <9 IU/mL 13 (H)  ? ?Component ?    Latest Ref Rng & Units 11/11/2013 01/28/2018 12/27/2018  ?Thyroperoxidase Ab SerPl-aCnc ?    <9 IU/mL 74.0 (H) 26 (H) 22 (H)  ? ?In the past, she was taking Tri-iodine and Tyrosine.   We stopped these. ?As the TSH was slightly high in 07/2014, we started her on levothyroxine, but subsequent TSH is slightly suppressed.  We had to stop levothyroxine then. ?She has been off levothyroxine for several years. ? ?We started selenium 200 mcg daily in 2019 - feels better on this.   ? ?Thyroid nodule: ? ?Pt denies: ?- hoarseness  ?- choking ? ?Tyroid ultrasound report (04/15/2013): ?Right thyroid lobe:  4.5 x 2.1 x 1.6 cm ?Left thyroid lobe:  4.5 x 1.6 x 2.1 cm ?Isthmus:  6 mm ?  ?Focal nodules:  Thyroid echotexture is diffusely heterogeneous. Thyroid morphology is diffusely lobular.   ?5 mm hyperechoic focus in the interpolar left lobe on image 24.   ?Otherwise, no measurable focal solid or cystic nodules seen.   ?Diffusely increased vascularity. ?  ?Lymphadenopathy:  None visualized. ?  ?IMPRESSION: ?Lobular thyroid diffusely with heterogeneous echotexture and suggestion of increased vascularity.  Appearance is nonspecific and ?could be related to thyroiditis or its sequela. ? ?Thyroid U/S (09/30/2021): ?Parenchymal Echotexture: Mildly heterogenous ?Isthmus: 7 mm  ?Right lobe: 4.1 x 1.9 x 1.0 cm  ?Left lobe: 3.8 x 1.5 x 1.8 cm ?_________________________________________________________ ?  ?Nonspecific diffuse mild thyroid heterogeneity without ?hypervascularity, discrete mass, or nodule. No regional adenopathy. ?  ?IMPRESSION: ?Mild thyroid heterogeneity compatible with medical thyroid disease. ?Negative for nodule. ? ?No FH of thyroid cancer. No h/o radiation tx to head or neck. ?No  recent contrast studies. No herbal supplements. No Biotin use. No recent steroids use.  ? ?DM2: ?- dx 08/2021 ?- Previously with increased thirst and increased urination.   ?- She has a family history of diabetes in mother, father, grandmother.   ? ?She is on: ?- Metformin ER 500 mg 2x a day-started 08/2021 -did not tolerate a higher dose due to diarrhea ?- Farxiga 5 mg before b'fast - started 2022 by cardiology ? ?Reviewed  HbA1c levels: ?12/13/2021: HbA1c 6.6% ?Lab Results  ?Component Value Date  ? HGBA1C 7.1 (A) 09/16/2021  ? HGBA1C 7.3 (H) 06/06/2021  ? ?She is trying to eat a healthy diet but cannot walk for exercise.   ? ?She does not check sugars now but had a CGM 2 mo ago >> will restart. From before: ?- am: 120s-160, 298 ?- 2h after b'fast: 150s ?- lunch: 150-180 ?- 2h after lunch: n/c ?- dinner: 93, 112-138 ?- 2h after dinner: n/c ?- bedtime: cookie + tea ? ?Reviewed BUN/creatinine: ?Lab Results  ?Component Value Date  ? BUN 13 07/28/2021  ? ?Lab Results  ?Component Value Date  ? CREATININE 1.11 (H) 07/28/2021  ?She is on Entresto. ? ?No lipid panel available for review -but she had a lipid panel checked recently by PCP.  We will try to obtain records. ?No results found for: CHOL, HDL, LDLCALC, LDLDIRECT, TRIG, CHOLHDL ? ?She is up-to-date with eye exams. ? ?She also has scleroderma, Sjogren's syndrome, fibromyalgia, Raynauds, also vitamin D deficiency (2000 IU daily)-all managed by her rheumatologist. ?She had a MRI, then lumbar puncture performed for R leg weakness with falls >> no MS. ?In 2019, she was diagnosed with LBBB + NICMP (EF reduced, 30%), also pulmonary HTN per review of the results of her  catheterization. She had a pacemaker and a defibrillator placed. She had cardiac rehab. ? ?She is a single mom. ? ?ROS: ?+ See HPI ?+ Joint aches. ? ?I reviewed pt's medications, allergies, PMH, social hx, family hx, and changes were documented in the history of present illness. Otherwise, unchanged from my initial visit note. ? ?Past Medical History:  ?Diagnosis Date  ? Anemia   ? Autoimmune disorder (HCC)   ? Bronchitis   ? hx -uses inhaler prn  ? Chronic kidney disease   ? kidney infections  ? Endometriosis   ? Fibromyalgia   ? GERD (gastroesophageal reflux disease)   ? occasional  ? Hashimoto thyroiditis, fibrous variant   ? Hashimoto's disease   ? Headache   ? migraines  ? History of kidney stones   ? Hypertension   ? IVCD  (intraventricular conduction defect)   ? LBBB (left bundle branch block)   ? Nonischemic cardiomyopathy (HCC)   ? per stress test 12-17-2017  ef 14%;  per echo 12-13-2017 ef 21%  ? Peripheral vascular disease (HCC)   ? raynoids syndrome  ? Pneumonia   ? only x 1 - 2- 3 months ago  ? Pulmonary hypertension (HCC)   ? per cardiac cath 12-18-2017--- moderate WHO Grp II    ? Scleroderma (HCC)   ? Sjogren's syndrome (HCC)   ? Sleep apnea   ? recently dx 04/13/18, does not have CPAP machine yet.  ? Systemic sclerosis (HCC)   ? Systolic CHF with reduced left ventricular function, NYHA class 3 (HCC)   ? cardiologist-  dr Evlyn Clines patwardhan  ? Tendinitis   ? lower back  ? Thyroiditis   ? ?Past Surgical History:  ?Procedure Laterality Date  ?  BIV ICD INSERTION CRT-D N/A 10/25/2018  ? Procedure: BIV ICD INSERTION CRT-D;  Surgeon: Marinus Maw, MD;  Location: [facility] INVASIVE CV LAB;  Service: Cardiovascular;  Laterality: N/A;  ? CESAREAN SECTION  2004  ? x 1  ? CHOLECYSTECTOMY    ? COLONOSCOPY    ? COLONOSCOPY WITH PROPOFOL N/A 07/08/2020  ? Procedure: COLONOSCOPY WITH PROPOFOL;  Surgeon: Kathi Der, MD;  Location: [facility] ENDOSCOPY;  Service: Gastroenterology;  Laterality: N/A;  ? CYSTOSCOPY    ? x 2 - bladder as a child  ? HYSTEROSCOPY WITH D & C N/A 04/16/2018  ? Procedure: DILATATION AND CURETTAGE /HYSTEROSCOPY;  Surgeon: Lavina Hamman, MD;  Location: [facility] ORS;  Service: Gynecology;  Laterality: N/A;  ? LAPAROSCOPIC LYSIS OF ADHESIONS  04/16/2018  ? Procedure: LAPAROSCOPIC LYSIS OF ADHESIONS;  Surgeon: Lavina Hamman, MD;  Location: [facility] ORS;  Service: Gynecology;;  ? MANDIBLE FRACTURE SURGERY  1994  ? upper mandibular  ? POLYPECTOMY  07/08/2020  ? Procedure: POLYPECTOMY;  Surgeon: Kathi Der, MD;  Location: [facility] ENDOSCOPY;  Service: Gastroenterology;;  ? RIGHT/LEFT HEART CATH AND CORONARY ANGIOGRAPHY N/A 12/18/2017  ? Procedure: RIGHT/LEFT HEART CATH AND CORONARY ANGIOGRAPHY;  Surgeon: Elder Negus, MD;  Location: [facility]  INVASIVE CV LAB;  Service: Cardiovascular;  Laterality: N/A;  ? UPPER GI ENDOSCOPY    ? WISDOM TOOTH EXTRACTION    ? ?Social History  ? ?Socioeconomic History  ? Marital status: Single  ?  Spouse name

## 2021-12-30 ENCOUNTER — Encounter: Payer: Self-pay | Admitting: Internal Medicine

## 2021-12-30 MED ORDER — LEVOTHYROXINE SODIUM 50 MCG PO TABS: 50.0000 ug | ORAL_TABLET | Freq: Every day | ORAL | 3 refills | Status: DC

## 2021-12-30 NOTE — Addendum Note (Signed)
Addended by: Philemon Kingdom on: 12/30/2021 05:39 PM ? ? Modules accepted: Orders ? ?

## 2022-01-02 ENCOUNTER — Telehealth: Payer: Self-pay | Admitting: Pharmacy Technician

## 2022-01-02 ENCOUNTER — Other Ambulatory Visit (HOSPITAL_COMMUNITY): Payer: Self-pay

## 2022-01-02 DIAGNOSIS — E1165 Type 2 diabetes mellitus with hyperglycemia: Secondary | ICD-10-CM

## 2022-01-02 MED ORDER — OZEMPIC (0.25 OR 0.5 MG/DOSE) 2 MG/3ML ~~LOC~~ SOPN: 0.5000 mg | PEN_INJECTOR | SUBCUTANEOUS | 3 refills | Status: DC

## 2022-01-02 NOTE — Telephone Encounter (Addendum)
Patient Advocate Encounter ?  ?Received notification from CoverMyMeds that prior authorization for [facility] 2.5mg  is required by his/her insurance [employer] [facility] Medicaid/[employer] [place]. ?  ?PA started on 01/02/22 (not yet submitted) ? ?These medications are preferred:  Bydureon pen, Byetta pen, Trulicity pen, Victoza pen, or Ozempic ? ?Is the patient currently taking a drug which has a drug-to-drug interaction with all the preferred drugs? ?Does the patient have a clinical contraindication, co-morbidity, or unique patient circumstance as a contraindication to all the preferred drugs? ? ?Key [id number] ?Status is pending ?   ?[facility] will continue to follow: ? ?Patient Advocate ?Fax:  [phone] ? ?

## 2022-01-02 NOTE — Telephone Encounter (Signed)
Rx sent to preferred pharmacy. Pt notified via MyChart of instructions.  ?

## 2022-01-03 ENCOUNTER — Other Ambulatory Visit (HOSPITAL_COMMUNITY): Payer: Self-pay

## 2022-01-30 ENCOUNTER — Ambulatory Visit: Payer: Medicaid Other | Admitting: Student

## 2022-01-30 NOTE — Progress Notes (Deleted)
   Patient is here for follow up visit.  Subjective:   @Patient ID: Sarah English, female    DOB: 07/04/1976, 46 y.o.   MRN: [id number]   No chief complaint on file.   HPI   46 y.o. Caucasian female with  nonischemic dilated cardiomyopathy, s/p BiVICD placement, Hashimoto thyroiditis, systemic sclerosis, Sjogren's disease, Raynaud's syndrome, morbid obesity, OSA compliant with CPAP.   Patient was last seen in the office 08/01/2021 at which time she was stable from a cardiovascular standpoint and advised to increase physical activity.  She now presents for 6-month follow-up. ***  ***Recent lipids?   Patient presents for 3-month follow-up of chronic systolic heart failure and hypertension.  Last office visit patient had been complaining of exertional dyspnea, therefore checked proBNP which was normal at 39 and repeat echocardiogram which revealed LVEF had further improved from 45-50% to 62% without significant abnormalities otherwise.  Patient has been experiencing worsening joint pain primarily in her hips knees and ankles which is currently limiting her physical activity.  She is in the process of establishing care with a new rheumatologist.  She continues to take Lasix 20 mg p.o. daily.  She does continue to have exertional dyspnea which is stable.  Current Outpatient Medications on File Prior to Visit  Medication Sig Dispense Refill   albuterol (PROVENTIL HFA;VENTOLIN HFA) 108 (90 Base) MCG/ACT inhaler Inhale 1 puff into the lungs every 4 (four) hours as needed for wheezing or shortness of breath.      Aspirin 325 MG CAPS Take 325 mg by mouth 2 (two) times daily.     Calcium-Magnesium-Zinc (CAL-MAG-ZINC PO) Take 1 tablet by mouth daily.     cetirizine (ZYRTEC) 10 MG tablet Take 10 mg by mouth daily.     Continuous Blood Gluc Sensor (FREESTYLE LIBRE 3 SENSOR) MISC 1 each by Does not apply route every 14 (fourteen) days. 2 each 11   CORLANOR 7.5 MG TABS tablet TAKE 1 TABLET BY MOUTH  TWICE A DAY WITH MEALS 60 tablet 3   cyclobenzaprine (FLEXERIL) 10 MG tablet Take 10 mg by mouth. As needed     cycloSPORINE (RESTASIS) 0.05 % ophthalmic emulsion Place 1 drop into both eyes 2 (two) times daily.      FARXIGA 5 MG TABS tablet TAKE 1 TABLET BY MOUTH EVERY DAY 90 tablet 1   furosemide (LASIX) 20 MG tablet TAKE 1 TABLET BY MOUTH EVERY DAY AS NEEDED 90 tablet 1   gabapentin (NEURONTIN) 300 MG capsule Take 1 capsule (300 mg total) by mouth 3 (three) times daily. 270 capsule 3   hyoscyamine (LEVSIN SL) 0.125 MG SL tablet Place 0.125 mg under the tongue every 4 (four) hours as needed for cramping.     levothyroxine (SYNTHROID) 50 MCG tablet Take 1 tablet (50 mcg total) by mouth daily before breakfast. 90 tablet 3   metFORMIN (GLUCOPHAGE) 500 MG tablet Take 1 tablet (500 mg total) by mouth 2 (two) times daily with a meal. 180 tablet 3   metoprolol succinate (TOPROL-XL) 50 MG 24 hr tablet TAKE 1 TABLET BY MOUTH EVERY DAY 90 tablet 0   montelukast (SINGULAIR) 10 MG tablet Take 1 tablet by mouth daily.     Multiple Vitamin (MULTIVITAMIN) tablet Take 1 tablet by mouth daily.     nitrofurantoin (MACRODANTIN) 25 MG capsule Take 25 mg by mouth daily.     Olopatadine HCl (PAZEO) 0.7 % SOLN      ondansetron (ZOFRAN-ODT) 4 MG disintegrating tablet ondansetron 4 mg   disintegrating tablet  PLACE 1 TABLET ON THE TONGUE AND ALLOW TO DISSOLVE EVERY 8 HOURS AS NEEDED     Probiotic Product (PROBIOTIC PO) Take 1 capsule by mouth daily.     sacubitril-valsartan (ENTRESTO) 97-103 MG Take 1 tablet by mouth 2 (two) times daily. 60 tablet 5   selenium (SELENIMIN-200) 200 MCG TABS tablet Take 200 mcg by mouth daily.      Semaglutide,0.25 or 0.5MG/DOS, (OZEMPIC, 0.25 OR 0.5 MG/DOSE,) 2 MG/3ML SOPN Inject 0.5 mg into the skin once a week. 9 mL 3   silver sulfADIAZINE (SILVADENE) 1 % cream Apply 1 application topically 2 (two) times daily as needed (wounds/affected area(s) of skin.).      sodium chloride (OCEAN)  0.65 % SOLN nasal spray Place 1 spray into both nostrils as needed for congestion.     spironolactone (ALDACTONE) 50 MG tablet TAKE 1 TABLET BY MOUTH EVERY DAY 90 tablet 2   SUMAtriptan (IMITREX) 100 MG tablet Take 100 mg by mouth every 6 (six) hours as needed for migraine. May repeat in 2 hours if headache persists or recurs.      traMADol (ULTRAM) 50 MG tablet 1 tablet as needed     No current facility-administered medications on file prior to visit.    Cardiovascular studies: ***   [facility] ECHOCARDIOGRAM COMPLETE 03/23/2021 Left ventricle cavity is normal in size and wall thickness. Normal global wall motion. Normal LV systolic function with EF 62%. Normal diastolic filling pattern. Structurally normal trileaflet aortic valve with trace aortic stenosis. No regurgitation. Compared to previous study on 02/03/2020, LVED has further improved from 45-50%. Trace aortic stenosis is new.  EKG 03/21/2021: Sinus rhythm 60 bpm with biventricular pacing  Echocardiogram 02/03/2020:  Left ventricle cavity is normal in size. Mild concentric hypertrophy of  the left ventricle. Mild global hypokinesis. LVEF 45-50%. Doppler evidence  of grade I (impaired) diastolic dysfunction, normal LAP.  Left atrial cavity is mildly dilated.  Estimated RA pressure 8 mmHg.  Compared to previous study 06/27/2019, LVEF has improved from 30-35% to  45-50%.    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  RV: 46/6 mmHg. RVEDP 11 mmHg PA 56/28 mmHg, mean PAP 40 mmHg PW: 27 mmHg LV 137/0 mmHg with LVEDP 36 mmHg  CO 5.2 L/min CI 2.2 L/min/m2  Recent labs:    Latest Ref Rng & Units 07/28/2021   11:36 AM 02/18/2020    3:20 PM 10/17/2018   10:56 AM  CMP  Glucose 70 - 99 mg/dL 120  93  88   BUN 6 - 24 mg/dL 13  11  14   Creatinine 0.57 - 1.00 mg/dL 1.11  0.95  1.08    Sodium 134 - 144 mmol/L 141  142  139   Potassium 3.5 - 5.2 mmol/L 4.7  4.4  4.6   Chloride 96 - 106 mmol/L 101  103  100   CO2 20 - 29 mmol/L 27  27  22   Calcium 8.7 - 10.2 mg/dL 9.6  9.7  9.5       Latest Ref Rng & Units 10/17/2018   10:56 AM 04/12/2018   11:30 AM 12/10/2017   12:04 PM  CBC  WBC 3.4 - 10.8 x10E3/uL 6.3  6.7  6.1   Hemoglobin 11.1 - 15.9 g/dL 13.6  14.4  14.4   Hematocrit 34.0 - 46.6 % 41.9  43.6  44.1     Platelets 150 - 450 x10E3/uL 388  302  352    Lipid Panel  No results found for: "CHOL", "TRIG", "HDL", "CHOLHDL", "VLDL", "LDLCALC", "LDLDIRECT" HEMOGLOBIN A1C Lab Results  Component Value Date   HGBA1C 7.1 (A) 09/16/2021   TSH Recent Labs    06/06/21 1513 12/29/21 1153  TSH 3.58 8.55*    Other labs: 10/17/2018: Glucose 88, BUN/Cr 14/1.08. EGFR 63. Na/K 139/4.6.  H/H 13.6/41.9. MCV 91. Platelets 388  12/2018: TSH 3.5 normal, free T4 0.78 normal Thyroperoxidase Ab 22 (elevated)   Review of Systems  Cardiovascular:  Positive for dyspnea on exertion (Mild, stable). Negative for chest pain, claudication, leg swelling, near-syncope, orthopnea, palpitations, paroxysmal nocturnal dyspnea and syncope.       Objective:    There were no vitals filed for this visit.     Physical Exam Vitals and nursing note reviewed.  Constitutional:      General: She is not in acute distress.    Appearance: She is obese.  Neck:     Vascular: No JVD.  Cardiovascular:     Rate and Rhythm: Normal rate and regular rhythm.     Pulses: Intact distal pulses.     Heart sounds: Normal heart sounds, S1 normal and S2 normal. No murmur heard.    No gallop.  Pulmonary:     Effort: Pulmonary effort is normal. No respiratory distress.     Breath sounds: Normal breath sounds. No wheezing, rhonchi or rales.  Musculoskeletal:     Right lower leg: No edema.     Left lower leg: No edema.   Physical exam unchanged compared to previous office visit.     Assessment &  Recommendations:   46 y.o. Caucasian female with  nonischemic dilated cardiomyopathy, now s/p BiVICD placement, Hashimoto thyroiditis, systemic sclerosis, Sjogren's disease, Raynaud's syndrome, morbid obesity, OSA compliant with CPAP. *** Chronic systolic heart failure (HCC): Nonischemic cardiomyopathy. NYHA class II LVEF improved to 62% (03/2021) Currently on Entresto 97-103 mg bid, metoprolol succinate to 50 mg daily,  spironolactone to 50 mg daily, corlanor to 7.5 mg bid, Farxiga 5 mg daily, lasix 20 mg daily. Suspect exertional dyspnea is related to deconditioning/physical inactivity as echocardiogram is continue to improve and BNP at last office visit was within normal limits. S/p Biv ICD.  She would like to continue monitoring with [facility] Encourage patient to focus on treating joint pain and increasing physical activity as well as reducing calorie intake.  Discussed diet and lifestyle modifications.  Suspect dyspnea will improve as patient increase his physical activity.  Hypertension: Controlled      Follow-up in 6 months, sooner if needed, for cardiomyopathy   Celeste C Cantwell, PA-C 01/30/2022, 8:46 AM Office: [phone]  

## 2022-02-07 ENCOUNTER — Ambulatory Visit (INDEPENDENT_AMBULATORY_CARE_PROVIDER_SITE_OTHER): Payer: Medicaid Other

## 2022-02-07 DIAGNOSIS — I428 Other cardiomyopathies: Secondary | ICD-10-CM

## 2022-02-08 LAB — CUP PACEART REMOTE DEVICE CHECK
Battery Remaining Longevity: 58 mo
Battery Remaining Percentage: 62 %
Battery Voltage: 2.96 V
Brady Statistic AP VP Percent: 10 %
Brady Statistic AP VS Percent: 1 %
Brady Statistic AS VP Percent: 88 %
Brady Statistic AS VS Percent: 1 %
Brady Statistic RA Percent Paced: 11 %
Date Time Interrogation Session: 20230621014140
HighPow Impedance: 88 Ohm
HighPow Impedance: 88 Ohm
Implantable Lead Implant Date: 20200306
Implantable Lead Implant Date: 20200306
Implantable Lead Implant Date: 20200306
Implantable Lead Location: 753858
Implantable Lead Location: 753859
Implantable Lead Location: 753860
Implantable Lead Model: 7122
Implantable Pulse Generator Implant Date: 20200306
Lead Channel Impedance Value: 1125 Ohm
Lead Channel Impedance Value: 540 Ohm
Lead Channel Impedance Value: 550 Ohm
Lead Channel Pacing Threshold Amplitude: 0.625 V
Lead Channel Pacing Threshold Amplitude: 0.625 V
Lead Channel Pacing Threshold Amplitude: 1.25 V
Lead Channel Pacing Threshold Pulse Width: 0.5 ms
Lead Channel Pacing Threshold Pulse Width: 0.5 ms
Lead Channel Pacing Threshold Pulse Width: 0.5 ms
Lead Channel Sensing Intrinsic Amplitude: 12 mV
Lead Channel Sensing Intrinsic Amplitude: 5 mV
Lead Channel Setting Pacing Amplitude: 1.625
Lead Channel Setting Pacing Amplitude: 2 V
Lead Channel Setting Pacing Amplitude: 2.25 V
Lead Channel Setting Pacing Pulse Width: 0.5 ms
Lead Channel Setting Pacing Pulse Width: 0.5 ms
Lead Channel Setting Sensing Sensitivity: 0.5 mV
Pulse Gen Serial Number: 9878925

## 2022-02-23 NOTE — Progress Notes (Signed)
Remote ICD transmission.   

## 2022-03-06 ENCOUNTER — Ambulatory Visit: Payer: Medicaid Other | Admitting: Neurology

## 2022-03-10 ENCOUNTER — Telehealth: Payer: Self-pay

## 2022-03-10 ENCOUNTER — Other Ambulatory Visit: Payer: Self-pay | Admitting: Internal Medicine

## 2022-03-10 MED ORDER — SEMAGLUTIDE (1 MG/DOSE) 4 MG/3ML ~~LOC~~ SOPN: 1.0000 mg | PEN_INJECTOR | SUBCUTANEOUS | 3 refills | Status: DC

## 2022-03-10 NOTE — Telephone Encounter (Signed)
Pt called requesting a increase in her Ozempic dose. She is tolerating the 0.5 mg dose well and would like to move to the 1 mg dose. Preferred pharmacy is [facility] [phone] [place], [place] - [address] AT [address] [employer]  [phone]

## 2022-03-10 NOTE — Telephone Encounter (Signed)
Sent!

## 2022-03-24 ENCOUNTER — Encounter: Payer: Medicaid Other | Admitting: Internal Medicine

## 2022-04-08 NOTE — Progress Notes (Deleted)
Cardiology Office Note Date:  04/08/2022  Patient ID:  Sarah English, DOB 11-12-1975, MRN [id number] PCP:  Cyndi Bender, PA-C  Cardiologist:  Dr. Virgina Jock Electrophysiologist: Dr. Lovena Le  ***refresh   Chief Complaint: *** over due  History of Present Illness: Sarah English is a 46 y.o. female with history of NICM, hashimoto's thyroiditis, Sjogren's disease, systemic sclerosis, Raynaud's, morbid obesity, CRT-D  She last saw Dr. Lovena Le 02/24/2019, doing better, less HF symptoms, encouraged to lose weight, no changes were made  She follows regularly with Dr. Virgina Jock and team, last saw C. Cantwell, PA-C, Dec 2022 to follow up on c/o SOB, her  proBNP which was normal at 39 and repeat echocardiogram which revealed LVEF had further improved from 45-50% to 62% without significant abnormalities otherwise. Escalating joint pain, pending new rheumatologist Ongoing but stable DOE, suspect mostly from deconditioning and encouraged to focus on joint pain management with her MDs and encouraged weight loss and increased physical activity.  *** device only  Device information Abbott CRT-D implanted 10/25/2018   Past Medical History:  Diagnosis Date   Anemia    Autoimmune disorder (HCC)    Bronchitis    hx -uses inhaler prn   Chronic kidney disease    kidney infections   Endometriosis    Fibromyalgia    GERD (gastroesophageal reflux disease)    occasional   Hashimoto thyroiditis, fibrous variant    Hashimoto's disease    Headache    migraines   History of kidney stones    Hypertension    IVCD (intraventricular conduction defect)    LBBB (left bundle branch block)    Nonischemic cardiomyopathy ([place])    per stress test 12-17-2017  ef 14%;  per echo 12-13-2017 ef 21%   Peripheral vascular disease ([place])    raynoids syndrome   Pneumonia    only x 1 - 2- 3 months ago   Pulmonary hypertension ([place])    per cardiac cath 12-18-2017--- moderate WHO Grp II     Scleroderma ([place])     Sjogren's syndrome ([place])    Sleep apnea    recently dx 04/13/18, does not have CPAP machine yet.   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, NYHA class 3 [facility])    cardiologist-  dr [name]   Tendinitis    lower back   Thyroiditis     Past Surgical History:  Procedure Laterality Date   BIV ICD INSERTION CRT-D N/A 10/25/2018   Procedure: BIV ICD INSERTION CRT-D;  Surgeon: Evans Lance, MD;  Location: [place] [facility];  Service: Cardiovascular;  Laterality: N/A;   CESAREAN SECTION  2004   x 1   CHOLECYSTECTOMY     COLONOSCOPY     COLONOSCOPY WITH PROPOFOL N/A 07/08/2020   Procedure: COLONOSCOPY WITH PROPOFOL;  Surgeon: Otis Brace, MD;  Location: [facility];  Service: Gastroenterology;  Laterality: N/A;   CYSTOSCOPY     x 2 - bladder as a child   HYSTEROSCOPY WITH D & C N/A 04/16/2018   Procedure: DILATATION AND CURETTAGE /HYSTEROSCOPY;  Surgeon: Cheri Fowler, MD;  Location: [place] [facility];  Service: Gynecology;  Laterality: N/A;   LAPAROSCOPIC LYSIS OF ADHESIONS  04/16/2018   Procedure: LAPAROSCOPIC LYSIS OF ADHESIONS;  Surgeon: Cheri Fowler, MD;  Location: [facility];  Service: Gynecology;;   MANDIBLE FRACTURE SURGERY  1994   upper mandibular   POLYPECTOMY  07/08/2020   Procedure: POLYPECTOMY;  Surgeon: Otis Brace, MD;  Location: [facility];  Service: Gastroenterology;;   RIGHT/LEFT HEART CATH AND CORONARY ANGIOGRAPHY N/A 12/18/2017   Procedure: RIGHT/LEFT HEART CATH AND CORONARY ANGIOGRAPHY;  Surgeon: Elder Negus, MD;  [facility];  Service: Cardiovascular;  Laterality: N/A;   UPPER GI ENDOSCOPY     WISDOM TOOTH EXTRACTION      Current Outpatient Medications  Medication Sig Dispense Refill   albuterol (PROVENTIL HFA;VENTOLIN HFA) 108 (90 Base) MCG/ACT inhaler Inhale 1 puff into the lungs every 4 (four) hours as needed for wheezing or shortness of breath.      Aspirin 325 MG CAPS Take 325 mg by  mouth 2 (two) times daily.     Calcium-Magnesium-Zinc (CAL-MAG-ZINC PO) Take 1 tablet by mouth daily.     cetirizine (ZYRTEC) 10 MG tablet Take 10 mg by mouth daily.     Continuous Blood Gluc Sensor (FREESTYLE LIBRE 3 SENSOR) MISC 1 each by Does not apply route every 14 (fourteen) days. 2 each 11   CORLANOR 7.5 MG TABS tablet TAKE 1 TABLET BY MOUTH TWICE A DAY WITH MEALS 60 tablet 3   cyclobenzaprine (FLEXERIL) 10 MG tablet Take 10 mg by mouth. As needed     cycloSPORINE (RESTASIS) 0.05 % ophthalmic emulsion Place 1 drop into both eyes 2 (two) times daily.      FARXIGA 5 MG TABS tablet TAKE 1 TABLET BY MOUTH EVERY DAY 90 tablet 1   furosemide (LASIX) 20 MG tablet TAKE 1 TABLET BY MOUTH EVERY DAY AS NEEDED 90 tablet 1   gabapentin (NEURONTIN) 300 MG capsule Take 1 capsule (300 mg total) by mouth 3 (three) times daily. 270 capsule 3   hyoscyamine (LEVSIN SL) 0.125 MG SL tablet Place 0.125 mg under the tongue every 4 (four) hours as needed for cramping.     levothyroxine (SYNTHROID) 50 MCG tablet Take 1 tablet (50 mcg total) by mouth daily before breakfast. 90 tablet 3   metFORMIN (GLUCOPHAGE) 500 MG tablet Take 1 tablet (500 mg total) by mouth 2 (two) times daily with a meal. 180 tablet 3   metoprolol succinate (TOPROL-XL) 50 MG 24 hr tablet TAKE 1 TABLET BY MOUTH EVERY DAY 90 tablet 0   montelukast (SINGULAIR) 10 MG tablet Take 1 tablet by mouth daily.     Multiple Vitamin (MULTIVITAMIN) tablet Take 1 tablet by mouth daily.     nitrofurantoin (MACRODANTIN) 25 MG capsule Take 25 mg by mouth daily.     Olopatadine HCl (PAZEO) 0.7 % SOLN      ondansetron (ZOFRAN-ODT) 4 MG disintegrating tablet ondansetron 4 mg disintegrating tablet  PLACE 1 TABLET ON THE TONGUE AND ALLOW TO DISSOLVE EVERY 8 HOURS AS NEEDED     Probiotic Product (PROBIOTIC PO) Take 1 capsule by mouth daily.     sacubitril-valsartan (ENTRESTO) 97-103 MG Take 1 tablet by mouth 2 (two) times daily. 60 tablet 5   selenium  (SELENIMIN-200) 200 MCG TABS tablet Take 200 mcg by mouth daily.      Semaglutide, 1 MG/DOSE, 4 MG/3ML SOPN Inject 1 mg as directed once a week. 9 mL 3   silver sulfADIAZINE (SILVADENE) 1 % cream Apply 1 application topically 2 (two) times daily as needed (wounds/affected area(s) of skin.).      sodium chloride (OCEAN) 0.65 % SOLN nasal spray Place 1 spray into both nostrils as needed for congestion.     spironolactone (ALDACTONE) 50 MG tablet TAKE 1 TABLET BY MOUTH EVERY DAY 90 tablet 2   SUMAtriptan (IMITREX) 100 MG tablet Take  100 mg by mouth every 6 (six) hours as needed for migraine. May repeat in 2 hours if headache persists or recurs.      traMADol (ULTRAM) 50 MG tablet 1 tablet as needed     No current facility-administered medications for this visit.    Allergies:   Sulfa antibiotics   Social History:  The patient  reports that she quit smoking about 9 years ago. Her smoking use included cigarettes. She has a 4.00 pack-year smoking history. She has never used smokeless tobacco. She reports current alcohol use. She reports that she does not use drugs.   Family History:  The patient's family history includes Diabetes in her father and mother; Heart disease in her mother; Hyperlipidemia in her father and mother; Hypertension in her father and mother.  ROS:  Please see the history of present illness.    All other systems are reviewed and otherwise negative.   PHYSICAL EXAM:  VS:  There were no vitals taken for this visit. BMI: There is no height or weight on file to calculate BMI. Well nourished, well developed, in no acute distress HEENT: normocephalic, atraumatic Neck: no JVD, carotid bruits or masses Cardiac:  *** RRR; no significant murmurs, no rubs, or gallops Lungs:  *** CTA b/l, no wheezing, rhonchi or rales Abd: soft, nontender MS: no deformity or *** atrophy Ext: *** no edema Skin: warm and dry, no rash Neuro:  No gross deficits appreciated Psych: euthymic mood, full  affect  *** ICD site is stable, no tethering or discomfort   EKG:  not done today  Device interrogation done today and reviewed by myself:  ***  PCV ECHOCARDIOGRAM COMPLETE 03/23/2021 Left ventricle cavity is normal in size and wall thickness. Normal global wall motion. Normal LV systolic function with EF 62%. Normal diastolic filling pattern. Structurally normal trileaflet aortic valve with trace aortic stenosis. No regurgitation. Compared to previous study on 02/03/2020, LVED has further improved from 45-50%. Trace aortic stenosis is new.    Echocardiogram 02/03/2020:  Left ventricle cavity is normal in size. Mild concentric hypertrophy of  the left ventricle. Mild global hypokinesis. LVEF 45-50%. Doppler evidence  of grade I (impaired) diastolic dysfunction, normal LAP.  Left atrial cavity is mildly dilated.  Estimated RA pressure 8 mmHg.  Compared to previous study 06/27/2019, LVEF has improved from 30-35% to  45-50%.     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   RV: 46/6 mmHg. RVEDP 11 mmHg PA 56/28 mmHg, mean PAP 40 mmHg PW: 27 mmHg LV 137/0 mmHg with LVEDP 36 mmHg   CO 5.2 L/min CI 2.2 L/min/m2  Recent Labs: 07/28/2021: BUN 13; Creatinine, Ser 1.11; NT-Pro BNP 40; Potassium 4.7; Sodium 141 12/29/2021: TSH 8.55  No results found for requested labs within last 365 days.   CrCl cannot be calculated (Patient's most recent lab result is older than the maximum 21 days allowed.).   Wt Readings from Last 3 Encounters:  12/29/21 (!) 323 lb 12.8 oz (146.9 kg)  09/16/21 (!) 327 lb (148.3 kg)  08/01/21 (!) 326 lb (147.9 kg)     Other studies reviewed: Additional studies/records reviewed today include: summarized above  ASSESSMENT AND PLAN:  CRT-D ***  NICM Recovered LVEf C/w Dr. Rosemary Holms and  team  Disposition: F/u with ***  Current medicines are reviewed at length with the patient today.  The patient did not have any concerns regarding  medicines.  Venetia Night, PA-C 04/08/2022 6:51 PM     [place] [address] [phone] (office)  [phone] (fax)

## 2022-04-11 ENCOUNTER — Encounter: Payer: Medicaid Other | Admitting: Physician Assistant

## 2022-04-15 ENCOUNTER — Other Ambulatory Visit: Payer: Self-pay | Admitting: Neurology

## 2022-04-15 ENCOUNTER — Other Ambulatory Visit: Payer: Self-pay | Admitting: Cardiology

## 2022-04-15 DIAGNOSIS — I5022 Chronic systolic (congestive) heart failure: Secondary | ICD-10-CM

## 2022-04-27 NOTE — Progress Notes (Deleted)
Cardiology Office Note Date:  04/27/2022  Patient ID:  Nai Borromeo, DOB [birth year], MRN [id number] PCP:  Lonie Peak, PA-C  Cardiologist:  Dr. Rosemary Holms Electrophysiologist: Dr. Ladona Ridgel  ***refresh   Chief Complaint: *** over due  History of Present Illness: Tayte Childers is a 46 y.o. female with history of NICM, hashimoto's thyroiditis, Sjogren's disease, systemic sclerosis, Raynaud's, morbid obesity, CRT-D  She last saw Dr. Ladona Ridgel 02/24/2019, doing better, less HF symptoms, encouraged to lose weight, no changes were made  She follows regularly with Dr. Rosemary Holms and team, last saw C. Cantwell, PA-C, Dec 2022 to follow up on c/o SOB, her  proBNP which was normal at 39 and repeat echocardiogram which revealed LVEF had further improved from 45-50% to 62% without significant abnormalities otherwise. Escalating joint pain, pending new rheumatologist Ongoing but stable DOE, suspect mostly from deconditioning and encouraged to focus on joint pain management with her MDs and encouraged weight loss and increased physical activity.  *** device only  Device information Abbott CRT-D implanted 10/25/2018   Past Medical History:  Diagnosis Date   Anemia    Autoimmune disorder (HCC)    Bronchitis    hx -uses inhaler prn   Chronic kidney disease    kidney infections   Endometriosis    Fibromyalgia    GERD (gastroesophageal reflux disease)    occasional   Hashimoto thyroiditis, fibrous variant    Hashimoto's disease    Headache    migraines   History of kidney stones    Hypertension    IVCD (intraventricular conduction defect)    LBBB (left bundle branch block)    Nonischemic cardiomyopathy (HCC)    per stress test 12-17-2017  ef 14%;  per echo 12-13-2017 ef 21%   Peripheral vascular disease (HCC)    raynoids syndrome   Pneumonia    only x 1 - 2- 3 months ago   Pulmonary hypertension (HCC)    per cardiac cath 12-18-2017--- moderate WHO Grp II     Scleroderma (HCC)     Sjogren's syndrome (HCC)    Sleep apnea    recently dx 04/13/18, does not have CPAP machine yet.   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, NYHA class 3 [facility])    cardiologist-  dr Evlyn Clines patwardhan   Tendinitis    lower back   Thyroiditis     Past Surgical History:  Procedure Laterality Date   BIV ICD INSERTION CRT-D N/A 10/25/2018   Procedure: BIV ICD INSERTION CRT-D;  Surgeon: Marinus Maw, MD;  Location: [facility];  Service: Cardiovascular;  Laterality: N/A;   CESAREAN SECTION  2004   x 1   CHOLECYSTECTOMY     COLONOSCOPY     COLONOSCOPY WITH PROPOFOL N/A 07/08/2020   Procedure: COLONOSCOPY WITH PROPOFOL;  Surgeon: Kathi Der, MD;  [facility];  Service: Gastroenterology;  Laterality: N/A;   CYSTOSCOPY     x 2 - bladder as a child   HYSTEROSCOPY WITH D & C N/A 04/16/2018   Procedure: DILATATION AND CURETTAGE /HYSTEROSCOPY;  Surgeon: Lavina Hamman, MD;  Location: [facility];  Service: Gynecology;  Laterality: N/A;   LAPAROSCOPIC LYSIS OF ADHESIONS  04/16/2018   Procedure: LAPAROSCOPIC LYSIS OF ADHESIONS;  Surgeon: Lavina Hamman, MD;  Location: [facility];  Service: Gynecology;;   MANDIBLE FRACTURE SURGERY  1994   upper mandibular   POLYPECTOMY  07/08/2020   Procedure: POLYPECTOMY;  Surgeon: Kathi Der, MD;  Location: [facility];  Service: Gastroenterology;;   RIGHT/LEFT HEART CATH AND CORONARY ANGIOGRAPHY N/A 12/18/2017   Procedure: RIGHT/LEFT HEART CATH AND CORONARY ANGIOGRAPHY;  Surgeon: Elder Negus, MD;  [facility]: [facility];  Service: Cardiovascular;  Laterality: N/A;   UPPER GI ENDOSCOPY     WISDOM TOOTH EXTRACTION      Current Outpatient Medications  Medication Sig Dispense Refill   albuterol (PROVENTIL HFA;VENTOLIN HFA) 108 (90 Base) MCG/ACT inhaler Inhale 1 puff into the lungs every 4 (four) hours as needed for wheezing or shortness of breath.      Aspirin 325 MG CAPS Take 325 mg by  mouth 2 (two) times daily.     Calcium-Magnesium-Zinc (CAL-MAG-ZINC PO) Take 1 tablet by mouth daily.     cetirizine (ZYRTEC) 10 MG tablet Take 10 mg by mouth daily.     Continuous Blood Gluc Sensor (FREESTYLE LIBRE 3 SENSOR) MISC 1 each by Does not apply route every 14 (fourteen) days. 2 each 11   CORLANOR 7.5 MG TABS tablet TAKE 1 TABLET BY MOUTH TWICE A DAY WITH MEALS 60 tablet 3   cyclobenzaprine (FLEXERIL) 10 MG tablet Take 10 mg by mouth. As needed     cycloSPORINE (RESTASIS) 0.05 % ophthalmic emulsion Place 1 drop into both eyes 2 (two) times daily.      FARXIGA 5 MG TABS tablet TAKE 1 TABLET BY MOUTH EVERY DAY 90 tablet 1   furosemide (LASIX) 20 MG tablet TAKE 1 TABLET BY MOUTH EVERY DAY AS NEEDED 90 tablet 1   gabapentin (NEURONTIN) 300 MG capsule TAKE 1 CAPSULE BY MOUTH THREE TIMES A DAY 90 capsule 0   hyoscyamine (LEVSIN SL) 0.125 MG SL tablet Place 0.125 mg under the tongue every 4 (four) hours as needed for cramping.     levothyroxine (SYNTHROID) 50 MCG tablet Take 1 tablet (50 mcg total) by mouth daily before breakfast. 90 tablet 3   metFORMIN (GLUCOPHAGE) 500 MG tablet Take 1 tablet (500 mg total) by mouth 2 (two) times daily with a meal. 180 tablet 3   metoprolol succinate (TOPROL-XL) 50 MG 24 hr tablet TAKE 1 TABLET BY MOUTH EVERY DAY 90 tablet 0   montelukast (SINGULAIR) 10 MG tablet Take 1 tablet by mouth daily.     Multiple Vitamin (MULTIVITAMIN) tablet Take 1 tablet by mouth daily.     nitrofurantoin (MACRODANTIN) 25 MG capsule Take 25 mg by mouth daily.     Olopatadine HCl (PAZEO) 0.7 % SOLN      ondansetron (ZOFRAN-ODT) 4 MG disintegrating tablet ondansetron 4 mg disintegrating tablet  PLACE 1 TABLET ON THE TONGUE AND ALLOW TO DISSOLVE EVERY 8 HOURS AS NEEDED     Probiotic Product (PROBIOTIC PO) Take 1 capsule by mouth daily.     sacubitril-valsartan (ENTRESTO) 97-103 MG Take 1 tablet by mouth 2 (two) times daily. 60 tablet 5   selenium (SELENIMIN-200) 200 MCG TABS tablet  Take 200 mcg by mouth daily.      Semaglutide, 1 MG/DOSE, 4 MG/3ML SOPN Inject 1 mg as directed once a week. 9 mL 3   silver sulfADIAZINE (SILVADENE) 1 % cream Apply 1 application topically 2 (two) times daily as needed (wounds/affected area(s) of skin.).      sodium chloride (OCEAN) 0.65 % SOLN nasal spray Place 1 spray into both nostrils as needed for congestion.     spironolactone (ALDACTONE) 50 MG tablet TAKE 1 TABLET BY MOUTH EVERY DAY 90 tablet 2   SUMAtriptan (IMITREX) 100 MG tablet Take 100 mg by  mouth every 6 (six) hours as needed for migraine. May repeat in 2 hours if headache persists or recurs.      traMADol (ULTRAM) 50 MG tablet 1 tablet as needed     No current facility-administered medications for this visit.    Allergies:   Sulfa antibiotics   Social History:  The patient  reports that she quit smoking about 9 years ago. Her smoking use included cigarettes. She has a 4.00 pack-year smoking history. She has never used smokeless tobacco. She reports current alcohol use. She reports that she does not use drugs.   Family History:  The patient's family history includes Diabetes in her father and mother; Heart disease in her mother; Hyperlipidemia in her father and mother; Hypertension in her father and mother.  ROS:  Please see the history of present illness.    All other systems are reviewed and otherwise negative.   PHYSICAL EXAM:  VS:  There were no vitals taken for this visit. BMI: There is no height or weight on file to calculate BMI. Well nourished, well developed, in no acute distress HEENT: normocephalic, atraumatic Neck: no JVD, carotid bruits or masses Cardiac:  *** RRR; no significant murmurs, no rubs, or gallops Lungs:  *** CTA b/l, no wheezing, rhonchi or rales Abd: soft, nontender MS: no deformity or *** atrophy Ext: *** no edema Skin: warm and dry, no rash Neuro:  No gross deficits appreciated Psych: euthymic mood, full affect  *** ICD site is stable, no  tethering or discomfort   EKG:  not done today  Device interrogation done today and reviewed by myself:  ***   PCV ECHOCARDIOGRAM COMPLETE 03/23/2021 Left ventricle cavity is normal in size and wall thickness. Normal global wall motion. Normal LV systolic function with EF 62%. Normal diastolic filling pattern. Structurally normal trileaflet aortic valve with trace aortic stenosis. No regurgitation. Compared to previous study on 02/03/2020, LVED has further improved from 45-50%. Trace aortic stenosis is new.    Echocardiogram 02/03/2020:  Left ventricle cavity is normal in size. Mild concentric hypertrophy of  the left ventricle. Mild global hypokinesis. LVEF 45-50%. Doppler evidence  of grade I (impaired) diastolic dysfunction, normal LAP.  Left atrial cavity is mildly dilated.  Estimated RA pressure 8 mmHg.  Compared to previous study 06/27/2019, LVEF has improved from 30-35% to  45-50%.     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   RV: 46/6 mmHg. RVEDP 11 mmHg PA 56/28 mmHg, mean PAP 40 mmHg PW: 27 mmHg LV 137/0 mmHg with LVEDP 36 mmHg   CO 5.2 L/min CI 2.2 L/min/m2  Recent Labs: 07/28/2021: BUN 13; Creatinine, Ser 1.11; NT-Pro BNP 40; Potassium 4.7; Sodium 141 12/29/2021: TSH 8.55  No results found for requested labs within last 365 days.   CrCl cannot be calculated (Patient's most recent lab result is older than the maximum 21 days allowed.).   Wt Readings from Last 3 Encounters:  12/29/21 (!) 323 lb 12.8 oz (146.9 kg)  09/16/21 (!) 327 lb (148.3 kg)  08/01/21 (!) 326 lb (147.9 kg)     Other studies reviewed: Additional studies/records reviewed today include: summarized above  ASSESSMENT AND PLAN:  CRT-D ***   NICM Recovered LVEF C/w Dr. Virgina Jock and team  Disposition: F/u with ***  Current  medicines are reviewed at length with the patient today.  The patient did not have any concerns regarding medicines.  Venetia Night, PA-C 04/27/2022 6:50 PM     [place] [address] [phone] (office)  [phone] (fax)

## 2022-04-28 ENCOUNTER — Encounter: Payer: Medicaid Other | Admitting: Physician Assistant

## 2022-05-05 ENCOUNTER — Ambulatory Visit: Payer: Medicaid Other | Admitting: Neurology

## 2022-05-05 ENCOUNTER — Other Ambulatory Visit: Payer: Self-pay | Admitting: Neurology

## 2022-05-05 ENCOUNTER — Encounter: Payer: Self-pay | Admitting: Neurology

## 2022-05-05 VITALS — BP 148/70 | HR 95 | Resp 18 | Ht 65.0 in | Wt 314.0 lb

## 2022-05-05 DIAGNOSIS — R202 Paresthesia of skin: Secondary | ICD-10-CM

## 2022-05-05 DIAGNOSIS — G44229 Chronic tension-type headache, not intractable: Secondary | ICD-10-CM | POA: Diagnosis not present

## 2022-05-05 DIAGNOSIS — R531 Weakness: Secondary | ICD-10-CM

## 2022-05-05 MED ORDER — GABAPENTIN 300 MG PO CAPS: ORAL_CAPSULE | ORAL | 3 refills | Status: DC

## 2022-05-05 MED ORDER — SUMATRIPTAN SUCCINATE 100 MG PO TABS
100.0000 mg | ORAL_TABLET | Freq: Four times a day (QID) | ORAL | 11 refills | Status: DC | PRN
Start: 2022-05-05 — End: 2022-05-05

## 2022-05-05 NOTE — Telephone Encounter (Signed)
Making sure this looks ok? Pharmacy sent it back

## 2022-05-05 NOTE — Patient Instructions (Addendum)
Increase gabapentin by taking 1 tablet in the AM and 2 tablets at bedtime x 1 week, then increase to 2 tablet twice daily.  Return to clinic in 1 year
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
## 2022-05-06 ENCOUNTER — Other Ambulatory Visit: Payer: Self-pay | Admitting: Cardiology

## 2022-05-06 DIAGNOSIS — I5022 Chronic systolic (congestive) heart failure: Secondary | ICD-10-CM

## 2022-05-09 ENCOUNTER — Ambulatory Visit (INDEPENDENT_AMBULATORY_CARE_PROVIDER_SITE_OTHER): Payer: Medicaid Other

## 2022-05-09 DIAGNOSIS — I428 Other cardiomyopathies: Secondary | ICD-10-CM | POA: Diagnosis not present

## 2022-05-09 NOTE — Progress Notes (Unsigned)
Cardiology Office Note Date:  05/09/2022  Patient ID:  Sarah English, DOB 28-Jul-1976, MRN [id number] PCP:  Cyndi Bender, PA-C  Cardiologist:  Dr. Virgina Jock Electrophysiologist: Dr. Lovena Le  ***refresh   Chief Complaint: *** over due  History of Present Illness: Sarah English is a 46 y.o. female with history of NICM, hashimoto's thyroiditis, Sjogren's disease, systemic sclerosis, Raynaud's, morbid obesity, CRT-D  She last saw Dr. Lovena Le 02/24/2019, doing better, less HF symptoms, encouraged to lose weight, no changes were made  She follows regularly with Dr. Virgina Jock and team, last saw C. Cantwell, PA-C, Dec 2022 to follow up on c/o SOB, her  proBNP which was normal at 39 and repeat echocardiogram which revealed LVEF had further improved from 45-50% to 62% without significant abnormalities otherwise. Escalating joint pain, pending new rheumatologist Ongoing but stable DOE, suspect mostly from deconditioning and encouraged to focus on joint pain management with her MDs and encouraged weight loss and increased physical activity.  *** device only  Device information Abbott CRT-D implanted 10/25/2018   Past Medical History:  Diagnosis Date   Anemia    Autoimmune disorder (HCC)    Bronchitis    hx -uses inhaler prn   Chronic kidney disease    kidney infections   Endometriosis    Fibromyalgia    GERD (gastroesophageal reflux disease)    occasional   Hashimoto thyroiditis, fibrous variant    Hashimoto's disease    Headache    migraines   History of kidney stones    Hypertension    IVCD (intraventricular conduction defect)    LBBB (left bundle branch block)    Nonischemic cardiomyopathy ([place])    per stress test 12-17-2017  ef 14%;  per echo 12-13-2017 ef 21%   Peripheral vascular disease ([place])    raynoids syndrome   Pneumonia    only x 1 - 2- 3 months ago   Pulmonary hypertension ([place])    per cardiac cath 12-18-2017--- moderate WHO Grp II     Scleroderma ([place])     Sjogren's syndrome ([place])    Sleep apnea    recently dx 04/13/18, does not have CPAP machine yet.   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, NYHA class 3 [facility])    cardiologist-  dr [name]   Tendinitis    lower back   Thyroiditis     Past Surgical History:  Procedure Laterality Date   BIV ICD INSERTION CRT-D N/A 10/25/2018   Procedure: BIV ICD INSERTION CRT-D;  Surgeon: Evans Lance, MD;  Location: [facility];  Service: Cardiovascular;  Laterality: N/A;   CESAREAN SECTION  2004   x 1   CHOLECYSTECTOMY     COLONOSCOPY     COLONOSCOPY WITH PROPOFOL N/A 07/08/2020   Procedure: COLONOSCOPY WITH PROPOFOL;  Surgeon: Otis Brace, MD;  Location: [facility];  Service: Gastroenterology;  Laterality: N/A;   CYSTOSCOPY     x 2 - bladder as a child   HYSTEROSCOPY WITH D & C N/A 04/16/2018   Procedure: DILATATION AND CURETTAGE /HYSTEROSCOPY;  Surgeon: Cheri Fowler, MD;  Location: [facility];  Service: Gynecology;  Laterality: N/A;   LAPAROSCOPIC LYSIS OF ADHESIONS  04/16/2018   Procedure: LAPAROSCOPIC LYSIS OF ADHESIONS;  Surgeon: Cheri Fowler, MD;  Location: [facility];  Service: Gynecology;;   MANDIBLE FRACTURE SURGERY  1994   upper mandibular   POLYPECTOMY  07/08/2020   Procedure: POLYPECTOMY;  Surgeon: Otis Brace, MD;  Location: [facility];  Service: Gastroenterology;;   RIGHT/LEFT HEART CATH AND CORONARY ANGIOGRAPHY N/A 12/18/2017   Procedure: RIGHT/LEFT HEART CATH AND CORONARY ANGIOGRAPHY;  Surgeon: Nigel Mormon, MD;  Location: [facility];  Service: Cardiovascular;  Laterality: N/A;   UPPER GI ENDOSCOPY     WISDOM TOOTH EXTRACTION      Current Outpatient Medications  Medication Sig Dispense Refill   albuterol (PROVENTIL HFA;VENTOLIN HFA) 108 (90 Base) MCG/ACT inhaler Inhale 1 puff into the lungs every 4 (four) hours as needed for wheezing or shortness of breath.      Aspirin 325 MG CAPS Take 325 mg by  mouth 2 (two) times daily. (Patient not taking: Reported on 05/05/2022)     Calcium-Magnesium-Zinc (CAL-MAG-ZINC PO) Take 1 tablet by mouth daily.     cetirizine (ZYRTEC) 10 MG tablet Take 10 mg by mouth daily.     Continuous Blood Gluc Sensor (FREESTYLE LIBRE 3 SENSOR) MISC 1 each by Does not apply route every 14 (fourteen) days. 2 each 11   CORLANOR 7.5 MG TABS tablet TAKE 1 TABLET BY MOUTH TWICE A DAY WITH MEALS 60 tablet 3   cyclobenzaprine (FLEXERIL) 10 MG tablet Take 10 mg by mouth. As needed     cycloSPORINE (RESTASIS) 0.05 % ophthalmic emulsion Place 1 drop into both eyes 2 (two) times daily.      FARXIGA 5 MG TABS tablet TAKE 1 TABLET BY MOUTH EVERY DAY 90 tablet 1   furosemide (LASIX) 20 MG tablet TAKE 1 TABLET BY MOUTH EVERY DAY AS NEEDED 90 tablet 1   gabapentin (NEURONTIN) 300 MG capsule Take 1 tablet in the AM and 2 tablets at bedtime x 1 week, then increase to 2 tablet twice daily. 360 capsule 3   hyoscyamine (LEVSIN SL) 0.125 MG SL tablet Place 0.125 mg under the tongue every 4 (four) hours as needed for cramping.     levothyroxine (SYNTHROID) 50 MCG tablet Take 1 tablet (50 mcg total) by mouth daily before breakfast. 90 tablet 3   metFORMIN (GLUCOPHAGE) 500 MG tablet Take 1 tablet (500 mg total) by mouth 2 (two) times daily with a meal. 180 tablet 3   metoprolol succinate (TOPROL-XL) 50 MG 24 hr tablet TAKE 1 TABLET BY MOUTH EVERY DAY 90 tablet 0   montelukast (SINGULAIR) 10 MG tablet Take 1 tablet by mouth daily.     Multiple Vitamin (MULTIVITAMIN) tablet Take 1 tablet by mouth daily.     nitrofurantoin (MACRODANTIN) 25 MG capsule Take 25 mg by mouth daily. (Patient not taking: Reported on 05/05/2022)     Olopatadine HCl (PAZEO) 0.7 % SOLN      ondansetron (ZOFRAN-ODT) 4 MG disintegrating tablet ondansetron 4 mg disintegrating tablet  PLACE 1 TABLET ON THE TONGUE AND ALLOW TO DISSOLVE EVERY 8 HOURS AS NEEDED     Probiotic Product (PROBIOTIC PO) Take 1 capsule by mouth daily.      sacubitril-valsartan (ENTRESTO) 97-103 MG Take 1 tablet by mouth 2 (two) times daily. 60 tablet 5   selenium (SELENIMIN-200) 200 MCG TABS tablet Take 200 mcg by mouth daily.      Semaglutide, 1 MG/DOSE, 4 MG/3ML SOPN Inject 1 mg as directed once a week. 9 mL 3   silver sulfADIAZINE (SILVADENE) 1 % cream Apply 1 application topically 2 (two) times daily as needed (wounds/affected area(s) of skin.).  (Patient not taking: Reported on 05/05/2022)     sodium chloride (OCEAN) 0.65 % SOLN nasal spray Place 1 spray into both nostrils as needed for congestion. (  Patient not taking: Reported on 05/05/2022)     spironolactone (ALDACTONE) 50 MG tablet TAKE 1 TABLET BY MOUTH EVERY DAY 90 tablet 2   SUMAtriptan (IMITREX) 100 MG tablet Take 1 tablet (100 mg total) by mouth 2 (two) times daily as needed for migraine. 10 tablet 11   traMADol (ULTRAM) 50 MG tablet 1 tablet as needed     No current facility-administered medications for this visit.    Allergies:   Sulfa antibiotics and Wasp venom   Social History:  The patient  reports that she quit smoking about 9 years ago. Her smoking use included cigarettes. She has a 4.00 pack-year smoking history. She has never used smokeless tobacco. She reports current alcohol use. She reports that she does not use drugs.   Family History:  The patient's family history includes Diabetes in her father and mother; Heart disease in her mother; Hyperlipidemia in her father and mother; Hypertension in her father and mother.  ROS:  Please see the history of present illness.    All other systems are reviewed and otherwise negative.   PHYSICAL EXAM:  VS:  There were no vitals taken for this visit. BMI: There is no height or weight on file to calculate BMI. Well nourished, well developed, in no acute distress HEENT: normocephalic, atraumatic Neck: no JVD, carotid bruits or masses Cardiac:  *** RRR; no significant murmurs, no rubs, or gallops Lungs:  *** CTA b/l, no wheezing,  rhonchi or rales Abd: soft, nontender MS: no deformity or *** atrophy Ext: *** no edema Skin: warm and dry, no rash Neuro:  No gross deficits appreciated Psych: euthymic mood, full affect  *** ICD site is stable, no tethering or discomfort   EKG:  not done today  Device interrogation done today and reviewed by myself:  ***   PCV ECHOCARDIOGRAM COMPLETE 03/23/2021 Left ventricle cavity is normal in size and wall thickness. Normal global wall motion. Normal LV systolic function with EF 62%. Normal diastolic filling pattern. Structurally normal trileaflet aortic valve with trace aortic stenosis. No regurgitation. Compared to previous study on 02/03/2020, LVED has further improved from 45-50%. Trace aortic stenosis is new.    Echocardiogram 02/03/2020:  Left ventricle cavity is normal in size. Mild concentric hypertrophy of  the left ventricle. Mild global hypokinesis. LVEF 45-50%. Doppler evidence  of grade I (impaired) diastolic dysfunction, normal LAP.  Left atrial cavity is mildly dilated.  Estimated RA pressure 8 mmHg.  Compared to previous study 06/27/2019, LVEF has improved from 30-35% to  45-50%.     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   RV: 46/6 mmHg. RVEDP 11 mmHg PA 56/28 mmHg, mean PAP 40 mmHg PW: 27 mmHg LV 137/0 mmHg with LVEDP 36 mmHg   CO 5.2 L/min CI 2.2 L/min/m2  Recent Labs: 07/28/2021: BUN 13; Creatinine, Ser 1.11; NT-Pro BNP 40; Potassium 4.7; Sodium 141 12/29/2021: TSH 8.55  No results found for requested labs within last 365 days.   CrCl cannot be calculated (Patient's most recent lab result is older than the maximum 21 days allowed.).   Wt Readings from Last 3 Encounters:  05/05/22 (!) 314 lb (142.4 kg)  12/29/21 (!) 323 lb 12.8 oz (146.9 kg)  09/16/21 (!) 327 lb (148.3 kg)     Other  studies reviewed: Additional studies/records reviewed today include: summarized above  ASSESSMENT AND PLAN:  CRT-D ***   NICM Recovered  LVEF C/w Dr. Virgina Jock and team  Disposition: F/u with ***  Current medicines are reviewed at length with the patient today.  The patient did not have any concerns regarding medicines.  Venetia Night, PA-C 05/09/2022 12:51 PM     [address] [phone] (office)  [phone] (fax)

## 2022-05-10 ENCOUNTER — Other Ambulatory Visit: Payer: Self-pay

## 2022-05-10 DIAGNOSIS — I5022 Chronic systolic (congestive) heart failure: Secondary | ICD-10-CM

## 2022-05-10 MED ORDER — SPIRONOLACTONE 50 MG PO TABS: 50.0000 mg | ORAL_TABLET | Freq: Every day | ORAL | 2 refills | Status: DC

## 2022-05-10 MED ORDER — IVABRADINE HCL 7.5 MG PO TABS: 7.5000 mg | ORAL_TABLET | Freq: Two times a day (BID) | ORAL | 3 refills | Status: DC

## 2022-05-10 MED ORDER — METOPROLOL SUCCINATE ER 50 MG PO TB24: 50.0000 mg | ORAL_TABLET | Freq: Every day | ORAL | 0 refills | Status: DC

## 2022-05-11 ENCOUNTER — Encounter: Payer: Self-pay | Admitting: Physician Assistant

## 2022-05-11 ENCOUNTER — Ambulatory Visit: Payer: Medicaid Other | Attending: Internal Medicine | Admitting: Physician Assistant

## 2022-05-11 VITALS — BP 100/62 | HR 78 | Ht 65.0 in | Wt 315.6 lb

## 2022-05-11 DIAGNOSIS — Z95810 Presence of automatic (implantable) cardiac defibrillator: Secondary | ICD-10-CM | POA: Diagnosis not present

## 2022-05-11 DIAGNOSIS — I428 Other cardiomyopathies: Secondary | ICD-10-CM | POA: Diagnosis not present

## 2022-05-11 LAB — CUP PACEART INCLINIC DEVICE CHECK
Battery Remaining Longevity: 55 mo
Brady Statistic RA Percent Paced: 11 %
Brady Statistic RV Percent Paced: 99 %
Date Time Interrogation Session: 20230921182521
HighPow Impedance: 81 Ohm
Implantable Lead Implant Date: 20200306
Implantable Lead Implant Date: 20200306
Implantable Lead Implant Date: 20200306
Implantable Lead Location: 753858
Implantable Lead Location: 753859
Implantable Lead Location: 753860
Implantable Lead Model: 7122
Implantable Pulse Generator Implant Date: 20200306
Lead Channel Impedance Value: 1100 Ohm
Lead Channel Impedance Value: 512.5 Ohm
Lead Channel Impedance Value: 562.5 Ohm
Lead Channel Pacing Threshold Amplitude: 0.5 V
Lead Channel Pacing Threshold Amplitude: 0.5 V
Lead Channel Pacing Threshold Amplitude: 1 V
Lead Channel Pacing Threshold Pulse Width: 0.5 ms
Lead Channel Pacing Threshold Pulse Width: 0.5 ms
Lead Channel Pacing Threshold Pulse Width: 0.5 ms
Lead Channel Sensing Intrinsic Amplitude: 12 mV
Lead Channel Sensing Intrinsic Amplitude: 5 mV
Lead Channel Setting Pacing Amplitude: 1.5 V
Lead Channel Setting Pacing Amplitude: 2 V
Lead Channel Setting Pacing Amplitude: 2 V
Lead Channel Setting Pacing Pulse Width: 0.5 ms
Lead Channel Setting Pacing Pulse Width: 0.5 ms
Lead Channel Setting Sensing Sensitivity: 0.5 mV
Pulse Gen Serial Number: 9878925

## 2022-05-11 LAB — CUP PACEART REMOTE DEVICE CHECK
Battery Remaining Longevity: 55 mo
Battery Remaining Percentage: 60 %
Battery Voltage: 2.96 V
Brady Statistic AP VP Percent: 9.9 %
Brady Statistic AP VS Percent: 1 %
Brady Statistic AS VP Percent: 89 %
Brady Statistic AS VS Percent: 1 %
Brady Statistic RA Percent Paced: 11 %
Date Time Interrogation Session: 20230919041729
HighPow Impedance: 86 Ohm
HighPow Impedance: 86 Ohm
Implantable Lead Implant Date: 20200306
Implantable Lead Implant Date: 20200306
Implantable Lead Implant Date: 20200306
Implantable Lead Location: 753858
Implantable Lead Location: 753859
Implantable Lead Location: 753860
Implantable Lead Model: 7122
Implantable Pulse Generator Implant Date: 20200306
Lead Channel Impedance Value: 1100 Ohm
Lead Channel Impedance Value: 480 Ohm
Lead Channel Impedance Value: 540 Ohm
Lead Channel Pacing Threshold Amplitude: 0.5 V
Lead Channel Pacing Threshold Amplitude: 0.5 V
Lead Channel Pacing Threshold Amplitude: 1.25 V
Lead Channel Pacing Threshold Pulse Width: 0.5 ms
Lead Channel Pacing Threshold Pulse Width: 0.5 ms
Lead Channel Pacing Threshold Pulse Width: 0.5 ms
Lead Channel Sensing Intrinsic Amplitude: 12 mV
Lead Channel Sensing Intrinsic Amplitude: 5 mV
Lead Channel Setting Pacing Amplitude: 1.5 V
Lead Channel Setting Pacing Amplitude: 2 V
Lead Channel Setting Pacing Amplitude: 2.25 V
Lead Channel Setting Pacing Pulse Width: 0.5 ms
Lead Channel Setting Pacing Pulse Width: 0.5 ms
Lead Channel Setting Sensing Sensitivity: 0.5 mV
Pulse Gen Serial Number: 9878925

## 2022-05-11 NOTE — Patient Instructions (Signed)
Medication Instructions:    Your physician recommends that you continue on your current medications as directed. Please refer to the Current Medication list given to you today.  *If you need a refill on your cardiac medications before your next appointment, please call your pharmacy*   Lab Work: NONE ORDERED  TODAY   If you have labs (blood work) drawn today and your tests are completely normal, you will receive your results only by: MyChart Message (if you have MyChart) OR A paper copy in the mail If you have any lab test that is abnormal or we need to change your treatment, we will call you to review the results.   Testing/Procedures: NONE ORDERED  TODAY     Follow-Up: At [facility], you and your health needs are our priority.  As part of our continuing mission to provide you with exceptional heart care, we have created designated Provider Care Teams.  These Care Teams include your primary Cardiologist (physician) and Advanced Practice Providers (APPs -  Physician Assistants and Nurse Practitioners) who all work together to provide you with the care you need, when you need it.  We recommend signing up for the patient portal called "MyChart".  Sign up information is provided on this After Visit Summary.  MyChart is used to connect with patients for Virtual Visits (Telemedicine).  Patients are able to view lab/test results, encounter notes, upcoming appointments, etc.  Non-urgent messages can be sent to your provider as well.   To learn more about what you can do with MyChart, go to [email or web address].    Your next appointment:   1 year(s)  The format for your next appointment:   In Person  Provider:   Gregg Taylor, MD    Other Instructions  Important Information About Sugar       

## 2022-05-23 NOTE — Progress Notes (Signed)
Remote ICD transmission.   

## 2022-06-01 ENCOUNTER — Encounter: Payer: Self-pay | Admitting: Internal Medicine

## 2022-06-01 ENCOUNTER — Ambulatory Visit: Payer: Medicaid Other | Admitting: Internal Medicine

## 2022-06-01 VITALS — BP 134/63 | HR 88 | Temp 98.0°F | Resp 16 | Ht 65.0 in | Wt 315.0 lb

## 2022-06-01 DIAGNOSIS — Z95810 Presence of automatic (implantable) cardiac defibrillator: Secondary | ICD-10-CM

## 2022-06-01 DIAGNOSIS — I10 Essential (primary) hypertension: Secondary | ICD-10-CM

## 2022-06-01 DIAGNOSIS — I428 Other cardiomyopathies: Secondary | ICD-10-CM

## 2022-06-01 NOTE — Progress Notes (Signed)
Patient is here for follow up visit.  Subjective:   _0  ID: Sarah English, female    DOB: Feb 17, 1976, 46 y.o.   MRN: [id number]   Chief Complaint  Patient presents with   Cardiomyopathy   Follow-up    6 month    HPI   46 y.o. Caucasian female with  nonischemic dilated cardiomyopathy, s/p BiVICD placement, Hashimoto thyroiditis, systemic sclerosis, Sjogren's disease, Raynaud's syndrome, morbid obesity, OSA compliant with CPAP.   Patient is here for a follow-up visit. She has been doing well. No chest pain, shortness of breath, palpitations, diaphoresis, syncope, edema, orthopnea.  Current Outpatient Medications on File Prior to Visit  Medication Sig Dispense Refill   albuterol (PROVENTIL HFA;VENTOLIN HFA) 108 (90 Base) MCG/ACT inhaler Inhale 1 puff into the lungs every 4 (four) hours as needed for wheezing or shortness of breath.      cetirizine (ZYRTEC) 10 MG tablet Take 10 mg by mouth daily.     cyclobenzaprine (FLEXERIL) 10 MG tablet Take 10 mg by mouth. As needed     cycloSPORINE (RESTASIS) 0.05 % ophthalmic emulsion Place 1 drop into both eyes 2 (two) times daily.      FARXIGA 5 MG TABS tablet TAKE 1 TABLET BY MOUTH EVERY DAY 90 tablet 1   furosemide (LASIX) 20 MG tablet TAKE 1 TABLET BY MOUTH EVERY DAY AS NEEDED 90 tablet 1   gabapentin (NEURONTIN) 300 MG capsule Take 1 tablet in the AM and 2 tablets at bedtime x 1 week, then increase to 2 tablet twice daily. 360 capsule 3   hyoscyamine (LEVSIN SL) 0.125 MG SL tablet Place 0.125 mg under the tongue every 4 (four) hours as needed for cramping.     ivabradine (CORLANOR) 7.5 MG TABS tablet Take 1 tablet (7.5 mg total) by mouth 2 (two) times daily with a meal. 60 tablet 3   levothyroxine (SYNTHROID) 50 MCG tablet Take 1 tablet (50 mcg total) by mouth daily before breakfast. 90 tablet 3   metoprolol succinate (TOPROL-XL) 50 MG 24 hr tablet Take 1 tablet (50 mg total) by mouth daily. Take with or immediately following a meal.  90 tablet 0   montelukast (SINGULAIR) 10 MG tablet Take 1 tablet by mouth daily.     Multiple Vitamin (MULTIVITAMIN) tablet Take 1 tablet by mouth daily.     Olopatadine HCl (PAZEO) 0.7 % SOLN      ondansetron (ZOFRAN-ODT) 4 MG disintegrating tablet ondansetron 4 mg disintegrating tablet  PLACE 1 TABLET ON THE TONGUE AND ALLOW TO DISSOLVE EVERY 8 HOURS AS NEEDED     Probiotic Product (PROBIOTIC PO) Take 1 capsule by mouth daily.     sacubitril-valsartan (ENTRESTO) 97-103 MG Take 1 tablet by mouth 2 (two) times daily. 60 tablet 5   selenium (SELENIMIN-200) 200 MCG TABS tablet Take 200 mcg by mouth daily.      Semaglutide, 1 MG/DOSE, 4 MG/3ML SOPN Inject 1 mg as directed once a week. 9 mL 3   silver sulfADIAZINE (SILVADENE) 1 % cream Apply 1 application  topically 2 (two) times daily as needed (wounds/affected area(s) of skin.).     sodium chloride (OCEAN) 0.65 % SOLN nasal spray Place 1 spray into both nostrils as needed for congestion.     spironolactone (ALDACTONE) 50 MG tablet Take 1 tablet (50 mg total) by mouth daily. 90 tablet 2   SUMAtriptan (IMITREX) 100 MG tablet Take 1 tablet (100 mg total) by mouth 2 (two) times daily as needed for  migraine. 10 tablet 11   traMADol (ULTRAM) 50 MG tablet 1 tablet as needed     No current facility-administered medications on file prior to visit.    Cardiovascular studies: PCV ECHOCARDIOGRAM COMPLETE 03/23/2021 Left ventricle cavity is normal in size and wall thickness. Normal global wall motion. Normal LV systolic function with EF 62%. Normal diastolic filling pattern. Structurally normal trileaflet aortic valve with trace aortic stenosis. No regurgitation. Compared to previous study on 02/03/2020, LVED has further improved from 45-50%. Trace aortic stenosis is new.  EKG 03/21/2021: Sinus rhythm 60 bpm with biventricular pacing  EKG 06/01/22: sinus rhythm, Bi-V pacing, normally functioning pacer  Echocardiogram 02/03/2020:  Left ventricle cavity is  normal in size. Mild concentric hypertrophy of  the left ventricle. Mild global hypokinesis. LVEF 45-50%. Doppler evidence  of grade I (impaired) diastolic dysfunction, normal LAP.  Left atrial cavity is mildly dilated.  Estimated RA pressure 8 mmHg.  Compared to previous study 06/27/2019, LVEF has improved from 30-35% to  45-50%.    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  RV: 46/6 mmHg. RVEDP 11 mmHg PA 56/28 mmHg, mean PAP 40 mmHg PW: 27 mmHg LV 137/0 mmHg with LVEDP 36 mmHg  CO 5.2 L/min CI 2.2 L/min/m2  Recent labs:    Latest Ref Rng & Units 07/28/2021   11:36 AM 02/18/2020    3:20 PM 10/17/2018   10:56 AM  CMP  Glucose 70 - 99 mg/dL 120  93  88   BUN 6 - 24 mg/dL _0 Creatinine 0.57 - 1.00 mg/dL 1.11  0.95  1.08   Sodium 134 - 144 mmol/L 141  142  139   Potassium 3.5 - 5.2 mmol/L 4.7  4.4  4.6   Chloride 96 - 106 mmol/L 101  103  100   CO2 20 - 29 mmol/L _1 Calcium 8.7 - 10.2 mg/dL 9.6  9.7  9.5       Latest Ref Rng & Units 10/17/2018   10:56 AM 04/12/2018   11:30 AM 12/10/2017   12:04 PM  CBC  WBC 3.4 - 10.8 x10E3/uL 6.3  6.7  6.1   Hemoglobin 11.1 - 15.9 g/dL 13.6  14.4  14.4   Hematocrit 34.0 - 46.6 % 41.9  43.6  44.1   Platelets 150 - 450 x10E3/uL 388  302  352    Lipid Panel  No results found for: "CHOL", "TRIG", "HDL", "CHOLHDL", "VLDL", "LDLCALC", "LDLDIRECT" HEMOGLOBIN A1C Lab Results  Component Value Date   HGBA1C 7.1 (A) 09/16/2021   TSH Recent Labs    06/06/21 1513 12/29/21 1153  TSH 3.58 8.55*   Other labs: 10/17/2018: Glucose 88, BUN/Cr 14/1.08. EGFR 63. Na/K 139/4.6.  H/H 13.6/41.9. MCV 91. Platelets 388  12/2018: TSH 3.5 normal, free T4 0.78 normal Thyroperoxidase Ab 22 (elevated)   Review of Systems  Cardiovascular:  Positive for dyspnea on exertion (Mild,  stable). Negative for chest pain, claudication, leg swelling, near-syncope, orthopnea, palpitations, paroxysmal nocturnal dyspnea and syncope.       Objective:    Vitals:   06/01/22 1021  BP: 134/63  Pulse: 88  Resp: 16  Temp: 98 F (36.7 C)  SpO2: 97%      Physical Exam Vitals and nursing note reviewed.  Constitutional:      General: She is not in acute distress.  Appearance: She is obese.  Neck:     Vascular: No JVD.  Cardiovascular:     Rate and Rhythm: Normal rate and regular rhythm.     Pulses: Intact distal pulses.     Heart sounds: Normal heart sounds, S1 normal and S2 normal. No murmur heard.    No gallop.  Pulmonary:     Effort: Pulmonary effort is normal. No respiratory distress.     Breath sounds: Normal breath sounds. No wheezing, rhonchi or rales.  Musculoskeletal:     Right lower leg: No edema.     Left lower leg: No edema.  Physical exam unchanged compared to previous office visit.     Assessment & Recommendations:   46 y.o. Caucasian female with  nonischemic dilated cardiomyopathy, now s/p BiVICD placement, Hashimoto thyroiditis, systemic sclerosis, Sjogren's disease, Raynaud's syndrome, morbid obesity, OSA compliant with CPAP.  Chronic systolic heart failure [facility]): Nonischemic cardiomyopathy. NYHA class II LVEF improved to 62% (03/2021) Currently on Entresto 97-103 mg bid, metoprolol succinate to 50 mg daily,  spironolactone to 50 mg daily, corlanor to 7.5 mg bid, Farxiga 5 mg daily, lasix 20 mg daily. S/p Biv ICD.  She would like to continue monitoring with [facility] Will repeat echo if there is clinical change, today patient appears to be stable without heart failure symptoms  Hypertension: Controlled      Follow-up in 6 months, sooner if needed, for cardiomyopathy   Floydene Flock, DO, [facility] 06/01/2022, 10:34 AM Office: [phone]

## 2022-06-06 ENCOUNTER — Ambulatory Visit: Payer: Medicaid Other | Admitting: Internal Medicine

## 2022-06-06 ENCOUNTER — Encounter: Payer: Self-pay | Admitting: Internal Medicine

## 2022-06-06 VITALS — BP 110/70 | HR 66 | Ht 65.0 in | Wt 313.2 lb

## 2022-06-06 DIAGNOSIS — E038 Other specified hypothyroidism: Secondary | ICD-10-CM

## 2022-06-06 DIAGNOSIS — E041 Nontoxic single thyroid nodule: Secondary | ICD-10-CM

## 2022-06-06 DIAGNOSIS — E1165 Type 2 diabetes mellitus with hyperglycemia: Secondary | ICD-10-CM

## 2022-06-06 DIAGNOSIS — E063 Autoimmune thyroiditis: Secondary | ICD-10-CM

## 2022-06-06 DIAGNOSIS — Z23 Encounter for immunization: Secondary | ICD-10-CM | POA: Diagnosis not present

## 2022-06-06 LAB — POCT GLYCOSYLATED HEMOGLOBIN (HGB A1C): Hemoglobin A1C: 5.8 % — AB (ref 4.0–5.6)

## 2022-06-06 NOTE — Progress Notes (Unsigned)
Patient ID: Sarah English, female   DOB: 21-Sep-1975, 46 y.o.   MRN: [id number]  HPI  Sarah English is a 46 y.o.-year-old female, returning for f/u for Hashimoto's thyroiditis, thyroid nodule, DM2, dx'ed 08/2021, non-insulin-dependent.  Last visit 5 months ago.  Interim history: She continues to have pain and swelling in her feet-difficult to exercise.   She sees a new rheumatologist >> was diagnosed with psoriatic arthritis  >> will start Cosentix. At last visit, she was trying to lose weight and inquired about Mounjaro.  We ended up starting Ozempic.  She tolerates it well. She lost ~20 lbs.  Hashimoto's hypothyroidism: Reviewed and addended history: Pt. has been dx with thyroiditis based on the aspect of the thyroid at the time of a thyroid U/S: heterogenous aspect, c/w thyroiditis. Afterwards, antithyroid antibodies also returned elevated.  In the past, she was taking Tri-iodine and Tyrosine.  We stopped these.  As the TSH was slightly high in 07/2014, we started her on levothyroxine, but subsequent TSH is slightly suppressed.  We had to stop levothyroxine then.  She has been off levothyroxine for several years until 12/2021, when we restarted LT4 50 mcg daily.  She did not return for labs after restarting this.  We started selenium 200 mcg daily in 2019 - feels better on this.    Pt takes levothyroxine 50 mcg daily: - in am - fasting - at least 30 min from b'fast - no calcium - no iron - no multivitamins - no PPIs - not on Biotin  Reviewed her TFTs Lab Results  Component Value Date   TSH 8.55 (H) 12/29/2021   TSH 3.58 06/06/2021   TSH 2.52 01/14/2020   TSH 3.54 12/27/2018   TSH 3.51 01/28/2018   FREET4 0.62 12/29/2021   FREET4 1.17 06/06/2021   FREET4 0.69 01/14/2020   FREET4 0.78 12/27/2018   FREET4 0.73 01/28/2018   Lab Results  Component Value Date   T3FREE 3.1 12/29/2021   T3FREE 5.9 (H) 06/06/2021   T3FREE 3.1 01/14/2020   T3FREE 3.2 12/27/2018   T3FREE 3.5  01/28/2018   T3FREE 3.2 07/30/2014   T3FREE 2.3 03/26/2014   T3FREE 2.8 02/10/2014   T3FREE 3.1 11/11/2013   Her TPO antibodies were elevated: Component     Latest Ref Rng & Units 01/14/2020  Thyroperoxidase Ab SerPl-aCnc     <9 IU/mL 13 (H)   Component     Latest Ref Rng & Units 11/11/2013 01/28/2018 12/27/2018  Thyroperoxidase Ab SerPl-aCnc     <9 IU/mL 74.0 (H) 26 (H) 22 (H)   Thyroid nodule:  Pt denies: - hoarseness  - choking  Tyroid ultrasound report (04/15/2013): Right thyroid lobe:  4.5 x 2.1 x 1.6 cm Left thyroid lobe:  4.5 x 1.6 x 2.1 cm Isthmus:  6 mm   Focal nodules:  Thyroid echotexture is diffusely heterogeneous. Thyroid morphology is diffusely lobular.   5 mm hyperechoic focus in the interpolar left lobe on image 24.   Otherwise, no measurable focal solid or cystic nodules seen.   Diffusely increased vascularity.   Lymphadenopathy:  None visualized.   IMPRESSION: Lobular thyroid diffusely with heterogeneous echotexture and suggestion of increased vascularity.  Appearance is nonspecific and could be related to thyroiditis or its sequela.  Thyroid U/S (09/30/2021): Parenchymal Echotexture: Mildly heterogenous Isthmus: 7 mm  Right lobe: 4.1 x 1.9 x 1.0 cm  Left lobe: 3.8 x 1.5 x 1.8 cm _________________________________________________________   Nonspecific diffuse mild thyroid heterogeneity without hypervascularity, discrete mass, or nodule.  No regional adenopathy.   IMPRESSION: Mild thyroid heterogeneity compatible with medical thyroid disease. Negative for nodule.  No FH of thyroid cancer. No h/o radiation tx to head or neck. No Biotin use. No recent steroids use.   DM2: - dx 08/2021 - Previously with increased thirst and increased urination.   - She has a family history of diabetes in mother, father, grandmother.    She is on: - Metformin ER 500 mg 2x a day-started 08/2021  >> 500 mg daily >>  diarrhea  - Farxiga 5 mg before b'fast - started  2022 by cardiology - Ozempic 0.25 >> 0.5 >> 1 mg weekly (last dose increase in 02/2022)  Reviewed HbA1c levels: 12/29/2021: HbA1c 6.9% 12/13/2021: HbA1c 6.6% Lab Results  Component Value Date   HGBA1C 7.1 (A) 09/16/2021   HGBA1C 7.3 (H) 06/06/2021   At last visit she was not checking blood sugars after coming off her CGM. Still not checking.  From before: - am: 120s-160, 298 - 2h after b'fast: 150s  - lunch: 150-180 - 2h after lunch: n/c - dinner: 93, 112-138 - 2h after dinner: n/c - bedtime: cookie + tea  Reviewed BUN/creatinine: 01/23/2022:  Ref Range & Units   Glucose 70 - 110 mg/dL 75   Sodium 136 - 145 mmol/L 138   Potassium 3.6 - 5.1 mmol/L 4.5   Chloride 97 - 109 mmol/L 104   Carbon Dioxide (CO2) 22.0 - 32.0 mmol/L 27.8   Urea Nitrogen (BUN) 7 - 25 mg/dL 15   Creatinine 0.6 - 1.1 mg/dL 0.9   Glomerular Filtration Rate (eGFR), MDRD Estimate >60 mL/min/1.73sq m 67   Calcium 8.7 - 10.3 mg/dL 9.6   AST  8 - 39 U/L 54 High    ALT  5 - 38 U/L 121 High    Alk Phos (alkaline Phosphatase) 34 - 104 U/L 86   Albumin 3.5 - 4.8 g/dL 4.6   Bilirubin, Total 0.3 - 1.2 mg/dL 0.8   Protein, Total 6.1 - 7.9 g/dL 7.8   A/G Ratio 1.0 - 5.0 gm/dL 1.4    Lab Results  Component Value Date   BUN 13 07/28/2021   Lab Results  Component Value Date   CREATININE 1.11 (H) 07/28/2021  She is on Entresto.  No lipid panel available for review -but she had a lipid panel checked  by PCP before last visit.  We were not able to obtain records. No results found for: "CHOL", "HDL", "[place]", "LDLDIRECT", "TRIG", "CHOLHDL"  Last eye exam: 2022 - No DR reportedly. Coming up.  She also has MCTD, psoriatic arthritis, Sjogren's syndrome, Raynauds, also vitamin D deficiency (2000 IU daily)-all managed by her rheumatologist. She had a MRI, then lumbar puncture performed for R leg weakness with falls >> no MS. In 2019, she was diagnosed with LBBB + NICMP (EF reduced, 30%), also pulmonary HTN per review  of the results of her  catheterization. She had a pacemaker and a defibrillator placed. She had cardiac rehab.  She is a single mom.  ROS: + See HPI  I reviewed pt's medications, allergies, PMH, social hx, family hx, and changes were documented in the history of present illness. Otherwise, unchanged from my initial visit note.  Past Medical History:  Diagnosis Date   Anemia    Autoimmune disorder ([place])    Bronchitis    hx -uses inhaler prn   Chronic kidney disease    kidney infections   Endometriosis    Fibromyalgia    GERD (gastroesophageal  reflux disease)    occasional   Hashimoto thyroiditis, fibrous variant    Hashimoto's disease    Headache    migraines   History of kidney stones    Hypertension    IVCD (intraventricular conduction defect)    LBBB (left bundle branch block)    Nonischemic cardiomyopathy ([place])    per stress test 12-17-2017  ef 14%;  per echo 12-13-2017 ef 21%   Peripheral vascular disease ([place])    raynoids syndrome   Pneumonia    only x 1 - 2- 3 months ago   Pulmonary hypertension ([place])    per cardiac cath 12-18-2017--- moderate WHO Grp II     Scleroderma ([place])    Sjogren's syndrome ([place])    Sleep apnea    recently dx 04/13/18, does not have CPAP machine yet.   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, NYHA class 3 [facility])    cardiologist-  dr [name]   Tendinitis    lower back   Thyroiditis    Past Surgical History:  Procedure Laterality Date   BIV ICD INSERTION CRT-D N/A 10/25/2018   Procedure: BIV ICD INSERTION CRT-D;  Surgeon: Evans Lance, MD;  Location: [facility];  Service: Cardiovascular;  Laterality: N/A;   CESAREAN SECTION  2004   x 1   CHOLECYSTECTOMY     COLONOSCOPY     COLONOSCOPY WITH PROPOFOL N/A 07/08/2020   Procedure: COLONOSCOPY WITH PROPOFOL;  Surgeon: Otis Brace, MD;  Location: [facility];  Service: Gastroenterology;  Laterality: N/A;   CYSTOSCOPY     x 2  - bladder as a child   HYSTEROSCOPY WITH D & C N/A 04/16/2018   Procedure: DILATATION AND CURETTAGE /HYSTEROSCOPY;  Surgeon: Cheri Fowler, MD;  Location: [facility];  Service: Gynecology;  Laterality: N/A;   LAPAROSCOPIC LYSIS OF ADHESIONS  04/16/2018   Procedure: LAPAROSCOPIC LYSIS OF ADHESIONS;  Surgeon: Cheri Fowler, MD;  Location: [facility];  Service: Gynecology;;   MANDIBLE FRACTURE SURGERY  1994   upper mandibular   POLYPECTOMY  07/08/2020   Procedure: POLYPECTOMY;  Surgeon: Otis Brace, MD;  Location: [facility];  Service: Gastroenterology;;   RIGHT/LEFT HEART CATH AND CORONARY ANGIOGRAPHY N/A 12/18/2017   Procedure: RIGHT/LEFT HEART CATH AND CORONARY ANGIOGRAPHY;  Surgeon: Nigel Mormon, MD;  Location: [facility];  Service: Cardiovascular;  Laterality: N/A;   UPPER GI ENDOSCOPY     WISDOM TOOTH EXTRACTION     Social History   Socioeconomic History   Marital status: Single    Spouse name: Not on file   Number of children: 1   Years of education: 14   Highest education level: Associate degree: occupational, Hotel manager, or vocational program  Occupational History   Occupation: not working  Tobacco Use   Smoking status: Former    Packs/day: 0.50    Years: 8.00    Total pack years: 4.00    Types: Cigarettes    Quit date: 12/10/2012    Years since quitting: 9.4   Smokeless tobacco: Never  Vaping Use   Vaping Use: Never used  Substance and Sexual Activity   Alcohol use: Yes    Comment: once a year   Drug use: No   Sexual activity: Yes    Birth control/protection: Pill  Other Topics Concern   Not on file  Social History Narrative   Lives with son, renting a room from her mother.  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     Education: college.    Right handed   1 coffee/tea  a day   Social Determinants of Health   Financial Resource Strain: Not on file  Food Insecurity: Not on file  Transportation Needs:  Not on file  Physical Activity: Not on file  Stress: Not on file  Social Connections: Not on file  Intimate Partner Violence: Not on file   Current Outpatient Medications on File Prior to Visit  Medication Sig Dispense Refill   albuterol (PROVENTIL HFA;VENTOLIN HFA) 108 (90 Base) MCG/ACT inhaler Inhale 1 puff into the lungs every 4 (four) hours as needed for wheezing or shortness of breath.      cetirizine (ZYRTEC) 10 MG tablet Take 10 mg by mouth daily.     cyclobenzaprine (FLEXERIL) 10 MG tablet Take 10 mg by mouth. As needed     cycloSPORINE (RESTASIS) 0.05 % ophthalmic emulsion Place 1 drop into both eyes 2 (two) times daily.      FARXIGA 5 MG TABS tablet TAKE 1 TABLET BY MOUTH EVERY DAY 90 tablet 1   furosemide (LASIX) 20 MG tablet TAKE 1 TABLET BY MOUTH EVERY DAY AS NEEDED 90 tablet 1   gabapentin (NEURONTIN) 300 MG capsule Take 1 tablet in the AM and 2 tablets at bedtime x 1 week, then increase to 2 tablet twice daily. 360 capsule 3   hyoscyamine (LEVSIN SL) 0.125 MG SL tablet Place 0.125 mg under the tongue every 4 (four) hours as needed for cramping.     ivabradine (CORLANOR) 7.5 MG TABS tablet Take 1 tablet (7.5 mg total) by mouth 2 (two) times daily with a meal. 60 tablet 3   levothyroxine (SYNTHROID) 50 MCG tablet Take 1 tablet (50 mcg total) by mouth daily before breakfast. 90 tablet 3   metoprolol succinate (TOPROL-XL) 50 MG 24 hr tablet Take 1 tablet (50 mg total) by mouth daily. Take with or immediately following a meal. 90 tablet 0   montelukast (SINGULAIR) 10 MG tablet Take 1 tablet by mouth daily.     Multiple Vitamin (MULTIVITAMIN) tablet Take 1 tablet by mouth daily.     Olopatadine HCl (PAZEO) 0.7 % SOLN      ondansetron (ZOFRAN-ODT) 4 MG disintegrating tablet ondansetron 4 mg disintegrating tablet  PLACE 1 TABLET ON THE TONGUE AND ALLOW TO DISSOLVE EVERY 8 HOURS AS NEEDED     Probiotic Product (PROBIOTIC PO) Take 1 capsule by mouth daily.     sacubitril-valsartan  (ENTRESTO) 97-103 MG Take 1 tablet by mouth 2 (two) times daily. 60 tablet 5   selenium (SELENIMIN-200) 200 MCG TABS tablet Take 200 mcg by mouth daily.      Semaglutide, 1 MG/DOSE, 4 MG/3ML SOPN Inject 1 mg as directed once a week. 9 mL 3   silver sulfADIAZINE (SILVADENE) 1 % cream Apply 1 application  topically 2 (two) times daily as needed (wounds/affected area(s) of skin.).     sodium chloride (OCEAN) 0.65 % SOLN nasal spray Place 1 spray into both nostrils as needed for congestion.     spironolactone (ALDACTONE) 50 MG tablet Take 1 tablet (50 mg total) by mouth daily. 90 tablet 2   SUMAtriptan (IMITREX) 100 MG tablet Take 1 tablet (100 mg total) by mouth 2 (two) times daily as needed for migraine. 10 tablet 11   traMADol (ULTRAM) 50 MG tablet 1 tablet as needed     No current facility-administered medications on file prior  to visit.   Allergies  Allergen Reactions   Sulfa Antibiotics Nausea And Vomiting   Wasp Venom Other (See Comments)   Family History  Problem Relation Age of Onset   Diabetes Mother        Type II   Heart disease Mother        stent surgery   Hypertension Mother    Hyperlipidemia Mother    Diabetes Father        Type II   Hypertension Father    Hyperlipidemia Father     PE: BP 110/70 (BP Location: Left Arm, Patient Position: Sitting, Cuff Size: Normal)   Pulse 66   Ht _0  (1.651 m)   Wt (!) 313 lb 3.2 oz (142.1 kg)   SpO2 99%   BMI 52.12 kg/m   Wt Readings from Last 3 Encounters:  06/06/22 (!) 313 lb 3.2 oz (142.1 kg)  06/01/22 (!) 315 lb (142.9 kg)  05/11/22 (!) 315 lb 9.6 oz (143.2 kg)   Constitutional: overweight, in NAD Eyes:  EOMI, no exophthalmos ENT: no neck masses, no cervical lymphadenopathy Cardiovascular: RRR, No MRG Respiratory: CTA B Musculoskeletal: no deformities Skin:no rashes Neurological: no tremor with outstretched hands Diabetic Foot Exam - Simple   Simple Foot Form Diabetic Foot exam was performed with the following  findings: Yes 06/06/2022  3:35 PM  Visual Inspection No deformities, no ulcerations, no other skin breakdown bilaterally: Yes Sensation Testing Intact to touch and monofilament testing bilaterally: Yes Pulse Check Posterior Tibialis and Dorsalis pulse intact bilaterally: Yes Comments    ASSESSMENT: 1. Hashimoto's thyroiditis  2.  Thyroid nodule  3.  Type 2 diabetes - FH of DM  PLAN:  1. Patient with history of Hashimoto's thyroiditis with mildly elevated TSH in the past, after which we tried a low-dose levothyroxine.  However, for this, TSH was suppressed so we had to stop levothyroxine.  TFTs were normal afterwards.  TPO antibodies were initially improving, but at last visit, they increased.  She continues on selenium 200 mcg daily -She has intermittent increased pressure in neck, most likely due to Hashimoto's thyroid inflammation.   - latest thyroid labs reviewed with pt. >> TSH was elevated, so I advised her to start back on levothyroxine: Lab Results  Component Value Date   TSH 8.55 (H) 12/29/2021  - she continues on LT4 50 mcg daily; she did not return for repeat labs after LT4 was restarted - pt feels good on this dose. - we discussed about taking the thyroid hormone every day, with water, >30 minutes before breakfast, separated by >4 hours from acid reflux medications, calcium, iron, multivitamins. Pt. is taking it correctly. - will check thyroid tests today: TSH and fT4 - If labs are abnormal, she will need to return for repeat TFTs in 1.5 months  2.  Thyroid nodule -She previously had neck pressure, but this was deemed to be most likely related to her Hashimoto's thyroiditis rather than the small nodule, measuring 5 mm on the ultrasound from 2014 -At the beginning of last year she again describes dysphagia and pressure in the neck.  Another thyroid ultrasound was checked and this did not show any abnormalities beyond heterogeneity consistent with Hashimoto's  thyroiditis. -Continue with selenium, which can help decrease thyroid inflammation -We will continue to monitor expectantly  3.  Type 2 diabetes -Relatively recent diagnosis -She is on a lower dose of metformin due to diarrhea and fatigue on the higher doses, also on low-dose SGLT2 inhibitor and  now on GLP-1 receptor agonist added at last visit and titrated up since then.  She feels well on Ozempic, and lost approximately 20 pounds.  However, she tells me that she cannot tolerate metformin even at the lowest dose due to diarrhea.  We will go ahead and stop this today. -At last visit, HbA1c was 6.6%, 1 month later, 6.9% -We did discuss in the past about the importance of improving diet, strongly recommended to stop snacks at night -At today's visit, she is still not checking sugars.  I again advised him to start checking every day or every other day, rotating check times. - I advised her to: Patient Instructions  You can stop Metformin.  Continue: - Farxiga 5 mg before b'fast - Ozempic 1 mg weekly  Continue Levothyroxine 50 mcg daily.  Take the thyroid hormone every day, with water, at least 30 minutes before breakfast, separated by at least 4 hours from: - acid reflux medications - calcium - iron - multivitamins  Please stop at the lab.  You should have an endocrinology follow-up appointment in 6 months.  - we checked her HbA1c: 5.8% (lower) - advised to check sugars at different times of the day - 1x a day, rotating check times - advised for yearly eye exams >> she is UTD - will check a lipid panel today - return to clinic in 6 months  Philemon Kingdom, MD PhD [facility] Endocrinology

## 2022-06-06 NOTE — Patient Instructions (Addendum)
You can stop Metformin.  Continue: - Farxiga 5 mg before b'fast - Ozempic 1 mg weekly  Continue Levothyroxine 50 mcg daily.  Take the thyroid hormone every day, with water, at least 30 minutes before breakfast, separated by at least 4 hours from: - acid reflux medications - calcium - iron - multivitamins  Please stop at the lab.  You should have an endocrinology follow-up appointment in 6 months.

## 2022-06-07 LAB — LIPID PANEL
Cholesterol: 163 mg/dL (ref 0–200)
HDL: 43.7 mg/dL (ref >39.00)
LDL Cholesterol: 87 mg/dL (ref 0–99)
NonHDL: 119.75
Total CHOL/HDL Ratio: 4
Triglycerides: 163 mg/dL — ABNORMAL HIGH (ref 0.0–149.0)
VLDL: 32.6 mg/dL (ref 0.0–40.0)

## 2022-06-07 LAB — T4, FREE: Free T4: 0.89 ng/dL (ref 0.60–1.60)

## 2022-06-07 LAB — TSH: TSH: 1.34 u[IU]/mL (ref 0.35–5.50)

## 2022-06-25 ENCOUNTER — Other Ambulatory Visit: Payer: Self-pay | Admitting: Cardiology

## 2022-07-06 ENCOUNTER — Other Ambulatory Visit: Payer: Self-pay

## 2022-07-06 MED ORDER — ENTRESTO 97-103 MG PO TABS: 1.0000 | ORAL_TABLET | Freq: Two times a day (BID) | ORAL | 5 refills | Status: DC

## 2022-07-18 ENCOUNTER — Other Ambulatory Visit: Payer: Self-pay

## 2022-07-18 MED ORDER — ENTRESTO 97-103 MG PO TABS: 1.0000 | ORAL_TABLET | Freq: Two times a day (BID) | ORAL | 5 refills | Status: DC

## 2022-08-05 ENCOUNTER — Other Ambulatory Visit: Payer: Self-pay | Admitting: Cardiology

## 2022-08-05 DIAGNOSIS — I5022 Chronic systolic (congestive) heart failure: Secondary | ICD-10-CM

## 2022-08-05 DIAGNOSIS — I428 Other cardiomyopathies: Secondary | ICD-10-CM

## 2022-08-08 ENCOUNTER — Ambulatory Visit (INDEPENDENT_AMBULATORY_CARE_PROVIDER_SITE_OTHER): Payer: Medicaid Other

## 2022-08-08 DIAGNOSIS — I428 Other cardiomyopathies: Secondary | ICD-10-CM | POA: Diagnosis not present

## 2022-08-08 LAB — CUP PACEART REMOTE DEVICE CHECK
Battery Remaining Longevity: 53 mo
Battery Remaining Percentage: 57 %
Battery Voltage: 2.96 V
Brady Statistic AP VP Percent: 3.6 %
Brady Statistic AP VS Percent: 1 %
Brady Statistic AS VP Percent: 96 %
Brady Statistic AS VS Percent: 1 %
Brady Statistic RA Percent Paced: 4.1 %
Date Time Interrogation Session: 20231219020017
HighPow Impedance: 82 Ohm
HighPow Impedance: 82 Ohm
Implantable Lead Connection Status: 753985
Implantable Lead Connection Status: 753985
Implantable Lead Connection Status: 753985
Implantable Lead Implant Date: 20200306
Implantable Lead Implant Date: 20200306
Implantable Lead Implant Date: 20200306
Implantable Lead Location: 753858
Implantable Lead Location: 753859
Implantable Lead Location: 753860
Implantable Lead Model: 7122
Implantable Pulse Generator Implant Date: 20200306
Lead Channel Impedance Value: 1125 Ohm
Lead Channel Impedance Value: 540 Ohm
Lead Channel Impedance Value: 550 Ohm
Lead Channel Pacing Threshold Amplitude: 0.625 V
Lead Channel Pacing Threshold Amplitude: 0.625 V
Lead Channel Pacing Threshold Amplitude: 1.25 V
Lead Channel Pacing Threshold Pulse Width: 0.5 ms
Lead Channel Pacing Threshold Pulse Width: 0.5 ms
Lead Channel Pacing Threshold Pulse Width: 0.5 ms
Lead Channel Sensing Intrinsic Amplitude: 12 mV
Lead Channel Sensing Intrinsic Amplitude: 5 mV
Lead Channel Setting Pacing Amplitude: 1.625
Lead Channel Setting Pacing Amplitude: 2 V
Lead Channel Setting Pacing Amplitude: 2.25 V
Lead Channel Setting Pacing Pulse Width: 0.5 ms
Lead Channel Setting Pacing Pulse Width: 0.5 ms
Lead Channel Setting Sensing Sensitivity: 0.5 mV
Pulse Gen Serial Number: 9878925
Zone Setting Status: 755011

## 2022-09-04 NOTE — Progress Notes (Signed)
Remote ICD transmission.   

## 2022-09-18 ENCOUNTER — Other Ambulatory Visit: Payer: Self-pay

## 2022-09-18 MED ORDER — FUROSEMIDE 20 MG PO TABS: 20.0000 mg | ORAL_TABLET | Freq: Every day | ORAL | 1 refills | Status: DC | PRN

## 2022-10-25 ENCOUNTER — Other Ambulatory Visit: Payer: Self-pay | Admitting: Cardiology

## 2022-10-25 DIAGNOSIS — I5022 Chronic systolic (congestive) heart failure: Secondary | ICD-10-CM

## 2022-11-07 ENCOUNTER — Ambulatory Visit (INDEPENDENT_AMBULATORY_CARE_PROVIDER_SITE_OTHER): Payer: Medicaid Other

## 2022-11-07 DIAGNOSIS — I428 Other cardiomyopathies: Secondary | ICD-10-CM | POA: Diagnosis not present

## 2022-11-08 LAB — CUP PACEART REMOTE DEVICE CHECK
Battery Remaining Longevity: 50 mo
Battery Remaining Percentage: 54 %
Battery Voltage: 2.95 V
Brady Statistic AP VP Percent: 4.4 %
Brady Statistic AP VS Percent: 1 %
Brady Statistic AS VP Percent: 95 %
Brady Statistic AS VS Percent: 1 %
Brady Statistic RA Percent Paced: 4.9 %
Date Time Interrogation Session: 20240319020017
HighPow Impedance: 82 Ohm
HighPow Impedance: 82 Ohm
Implantable Lead Connection Status: 753985
Implantable Lead Connection Status: 753985
Implantable Lead Connection Status: 753985
Implantable Lead Implant Date: 20200306
Implantable Lead Implant Date: 20200306
Implantable Lead Implant Date: 20200306
Implantable Lead Location: 753858
Implantable Lead Location: 753859
Implantable Lead Location: 753860
Implantable Lead Model: 7122
Implantable Pulse Generator Implant Date: 20200306
Lead Channel Impedance Value: 1100 Ohm
Lead Channel Impedance Value: 460 Ohm
Lead Channel Impedance Value: 530 Ohm
Lead Channel Pacing Threshold Amplitude: 0.5 V
Lead Channel Pacing Threshold Amplitude: 0.5 V
Lead Channel Pacing Threshold Amplitude: 1.125 V
Lead Channel Pacing Threshold Pulse Width: 0.5 ms
Lead Channel Pacing Threshold Pulse Width: 0.5 ms
Lead Channel Pacing Threshold Pulse Width: 0.5 ms
Lead Channel Sensing Intrinsic Amplitude: 11.9 mV
Lead Channel Sensing Intrinsic Amplitude: 5 mV
Lead Channel Setting Pacing Amplitude: 1.5 V
Lead Channel Setting Pacing Amplitude: 2 V
Lead Channel Setting Pacing Amplitude: 2.125
Lead Channel Setting Pacing Pulse Width: 0.5 ms
Lead Channel Setting Pacing Pulse Width: 0.5 ms
Lead Channel Setting Sensing Sensitivity: 0.5 mV
Pulse Gen Serial Number: 9878925
Zone Setting Status: 755011

## 2022-11-30 ENCOUNTER — Ambulatory Visit: Payer: Self-pay | Admitting: Internal Medicine

## 2022-11-30 NOTE — Progress Notes (Signed)
Rescheduled

## 2022-12-01 ENCOUNTER — Ambulatory Visit: Payer: Medicaid Other | Admitting: Internal Medicine

## 2022-12-06 ENCOUNTER — Ambulatory Visit: Payer: Medicaid Other | Admitting: Internal Medicine

## 2022-12-06 ENCOUNTER — Other Ambulatory Visit: Payer: Self-pay | Admitting: Internal Medicine

## 2022-12-06 DIAGNOSIS — I5022 Chronic systolic (congestive) heart failure: Secondary | ICD-10-CM

## 2022-12-08 ENCOUNTER — Ambulatory Visit: Payer: Medicaid Other | Admitting: Internal Medicine

## 2022-12-15 ENCOUNTER — Ambulatory Visit: Payer: Medicaid Other | Admitting: Internal Medicine

## 2022-12-19 NOTE — Progress Notes (Signed)
Remote ICD transmission.   

## 2022-12-22 ENCOUNTER — Encounter: Payer: Self-pay | Admitting: Internal Medicine

## 2022-12-22 ENCOUNTER — Ambulatory Visit: Payer: Medicaid Other | Admitting: Internal Medicine

## 2022-12-22 VITALS — BP 114/69 | HR 69 | Ht 65.0 in | Wt 301.0 lb

## 2022-12-22 DIAGNOSIS — I428 Other cardiomyopathies: Secondary | ICD-10-CM

## 2022-12-22 DIAGNOSIS — I10 Essential (primary) hypertension: Secondary | ICD-10-CM

## 2022-12-22 DIAGNOSIS — Z95810 Presence of automatic (implantable) cardiac defibrillator: Secondary | ICD-10-CM

## 2022-12-22 DIAGNOSIS — I5022 Chronic systolic (congestive) heart failure: Secondary | ICD-10-CM

## 2022-12-22 NOTE — Progress Notes (Signed)
Patient is here for follow up visit.  Subjective:   @Patient  ID: Sarah English, female    DOB: 09-11-1975, 47 y.o.   MRN: [id number]   Chief Complaint  Patient presents with   Nonischemic cardiomyopathy   Follow-up   Shortness of Breath    Shortness of Breath Pertinent negatives include no chest pain, claudication, leg swelling, orthopnea, PND or syncope.     47 y.o. Caucasian female with  nonischemic dilated cardiomyopathy, s/p BiVICD placement, Hashimoto thyroiditis, systemic sclerosis, Sjogren's disease, Raynaud's syndrome, morbid obesity, OSA compliant with CPAP.   Patient is here for a follow-up visit. She has been doing great with 6 month follow-ups but she can likely just check in annually since she also follows with heart failure group. She was recently diagnosed with psoriatic arthritis but she has been doing well on her maintenance medications. She is trying to lose weight and she is happy she has lost some since our last visit. She will continue with her weight loss journey. She admits to shortness of breath when there is a lot of pollen outside but otherwise she is not experiencing dyspnea on exertion.  No chest pain, palpitations, diaphoresis, syncope, edema, orthopnea.  Current Outpatient Medications on File Prior to Visit  Medication Sig Dispense Refill   albuterol (PROVENTIL HFA;VENTOLIN HFA) 108 (90 Base) MCG/ACT inhaler Inhale 1 puff into the lungs every 4 (four) hours as needed for wheezing or shortness of breath.      cetirizine (ZYRTEC) 10 MG tablet Take 10 mg by mouth daily.     cyclobenzaprine (FLEXERIL) 10 MG tablet Take 10 mg by mouth. As needed     cycloSPORINE (RESTASIS) 0.05 % ophthalmic emulsion Place 1 drop into both eyes 2 (two) times daily.      FARXIGA 5 MG TABS tablet TAKE 1 TABLET BY MOUTH EVERY DAY 90 tablet 1   furosemide (LASIX) 20 MG tablet Take 1 tablet (20 mg total) by mouth daily as needed. 90 tablet 1   gabapentin (NEURONTIN) 300 MG  capsule Take 1 tablet in the AM and 2 tablets at bedtime x 1 week, then increase to 2 tablet twice daily. 360 capsule 3   hyoscyamine (LEVSIN SL) 0.125 MG SL tablet Place 0.125 mg under the tongue every 4 (four) hours as needed for cramping.     ivabradine (CORLANOR) 7.5 MG TABS tablet TAKE 1 TABLET (7.5 MG TOTAL) BY MOUTH 2 (TWO) TIMES DAILY WITH A MEAL. 60 tablet 3   levothyroxine (SYNTHROID) 50 MCG tablet TAKE 1 TABLET BY MOUTH DAILY BEFORE BREAKFAST 90 tablet 0   metoprolol succinate (TOPROL-XL) 50 MG 24 hr tablet TAKE 1 TABLET BY MOUTH DAILY. TAKE WITH OR IMMEDIATELY FOLLOWING A MEAL. 90 tablet 1   montelukast (SINGULAIR) 10 MG tablet Take 1 tablet by mouth daily.     Probiotic Product (PROBIOTIC PO) Take 1 capsule by mouth daily.     sacubitril-valsartan (ENTRESTO) 97-103 MG Take 1 tablet by mouth 2 (two) times daily. 60 tablet 5   selenium (SELENIMIN-200) 200 MCG TABS tablet Take 200 mcg by mouth daily.      Semaglutide, 1 MG/DOSE, 4 MG/3ML SOPN Inject 1 mg as directed once a week. 9 mL 3   silver sulfADIAZINE (SILVADENE) 1 % cream Apply 1 application  topically 2 (two) times daily as needed (wounds/affected area(s) of skin.).     sodium chloride (OCEAN) 0.65 % SOLN nasal spray Place 1 spray into both nostrils as needed for congestion.  spironolactone (ALDACTONE) 50 MG tablet Take 1 tablet (50 mg total) by mouth daily. 90 tablet 2   SUMAtriptan (IMITREX) 100 MG tablet Take 1 tablet (100 mg total) by mouth 2 (two) times daily as needed for migraine. 10 tablet 11   traMADol (ULTRAM) 50 MG tablet 1 tablet as needed     ondansetron (ZOFRAN-ODT) 4 MG disintegrating tablet ondansetron 4 mg disintegrating tablet  PLACE 1 TABLET ON THE TONGUE AND ALLOW TO DISSOLVE EVERY 8 HOURS AS NEEDED (Patient not taking: Reported on 12/22/2022)     No current facility-administered medications on file prior to visit.    Cardiovascular studies: PCV ECHOCARDIOGRAM COMPLETE 03/23/2021 Left ventricle cavity is  normal in size and wall thickness. Normal global wall motion. Normal LV systolic function with EF 62%. Normal diastolic filling pattern. Structurally normal trileaflet aortic valve with trace aortic stenosis. No regurgitation. Compared to previous study on 02/03/2020, LVED has further improved from 45-50%. Trace aortic stenosis is new.  EKG 03/21/2021: Sinus rhythm 60 bpm with biventricular pacing  EKG 06/01/22: sinus rhythm, Bi-V pacing, normally functioning pacer  Echocardiogram 02/03/2020:  Left ventricle cavity is normal in size. Mild concentric hypertrophy of  the left ventricle. Mild global hypokinesis. LVEF 45-50%. Doppler evidence  of grade I (impaired) diastolic dysfunction, normal LAP.  Left atrial cavity is mildly dilated.  Estimated RA pressure 8 mmHg.  Compared to previous study 06/27/2019, LVEF has improved from 30-35% to  45-50%.    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  RV: 46/6 mmHg. RVEDP 11 mmHg PA 56/28 mmHg, mean PAP 40 mmHg PW: 27 mmHg LV 137/0 mmHg with LVEDP 36 mmHg  CO 5.2 L/min CI 2.2 L/min/m2  Recent labs:    Latest Ref Rng & Units 07/28/2021   11:36 AM 02/18/2020    3:20 PM 10/17/2018   10:56 AM  CMP  Glucose 70 - 99 mg/dL 811  93  88   BUN 6 - 24 mg/dL 13  11  14    Creatinine 0.57 - 1.00 mg/dL 9.14  7.82  9.56   Sodium 134 - 144 mmol/L 141  142  139   Potassium 3.5 - 5.2 mmol/L 4.7  4.4  4.6   Chloride 96 - 106 mmol/L 101  103  100   CO2 20 - 29 mmol/L 27  27  22    Calcium 8.7 - 10.2 mg/dL 9.6  9.7  9.5       Latest Ref Rng & Units 10/17/2018   10:56 AM 04/12/2018   11:30 AM 12/10/2017   12:04 PM  CBC  WBC 3.4 - 10.8 x10E3/uL 6.3  6.7  6.1   Hemoglobin 11.1 - 15.9 g/dL 21.3  08.6  57.8   Hematocrit 34.0 - 46.6 % 41.9  43.6  44.1   Platelets 150 - 450 x10E3/uL 388  302  352    Lipid  Panel     Component Value Date/Time   CHOL 163 06/06/2022 1541   TRIG 163.0 (H) 06/06/2022 1541   HDL 43.70 06/06/2022 1541   CHOLHDL 4 06/06/2022 1541   VLDL 32.6 06/06/2022 1541   LDLCALC 87 06/06/2022 1541   HEMOGLOBIN A1C Lab Results  Component Value Date   HGBA1C 5.8 (A) 06/06/2022   TSH Recent Labs    12/29/21 1153 06/06/22 1541  TSH 8.55* 1.34   Other labs: 10/17/2018: Glucose 88, BUN/Cr 14/1.08. EGFR 63. Na/K 139/4.6.  H/H 13.6/41.9. MCV 91. Platelets 388  12/2018: TSH 3.5 normal, free T4 0.78 normal Thyroperoxidase Ab 22 (elevated)   Review of Systems  Cardiovascular:  Negative for chest pain, claudication, dyspnea on exertion, leg swelling, near-syncope, orthopnea, palpitations, paroxysmal nocturnal dyspnea and syncope.  Respiratory:  Positive for shortness of breath (allergy related).        Objective:    Vitals:   12/22/22 0841  BP: 114/69  Pulse: 69  SpO2: 97%      Physical Exam Vitals and nursing note reviewed.  Constitutional:      General: She is not in acute distress.    Appearance: She is obese.  Neck:     Vascular: No JVD.  Cardiovascular:     Rate and Rhythm: Normal rate and regular rhythm.     Pulses: Intact distal pulses.     Heart sounds: Normal heart sounds, S1 normal and S2 normal. No murmur heard.    No gallop.  Pulmonary:     Effort: Pulmonary effort is normal. No respiratory distress.     Breath sounds: Normal breath sounds. No wheezing, rhonchi or rales.  Musculoskeletal:     Right lower leg: No edema.     Left lower leg: No edema.   Physical exam unchanged compared to previous office visit.     Assessment & Recommendations:   47 y.o. Caucasian female with  nonischemic dilated cardiomyopathy, now s/p BiVICD placement, Hashimoto thyroiditis, systemic sclerosis, Sjogren's disease, Raynaud's syndrome, morbid obesity, OSA compliant with CPAP.  Chronic systolic heart failure [facility]): Nonischemic cardiomyopathy  (HCC) Biventricular implantable cardioverter-defibrillator (ICD) in situ Nonischemic cardiomyopathy. NYHA class II LVEF improved to 62% (03/2021) Currently on Entresto 97-103 mg bid, metoprolol succinate to 50 mg daily,  spironolactone to 50 mg daily, corlanor to 7.5 mg bid, Farxiga 5 mg daily, lasix 20 mg daily. S/p Biv ICD.  She would like to continue monitoring with [facility] so we will go to annual visits here as she has been very stable on recent follow-ups Will repeat echo prior to next appointment, today patient appears to be stable without heart failure symptoms  Hypertension: Very well controlled     Continue current cardiac medications. Encourage low-sodium diet, less than 2000 mg daily.     Clotilde Dieter, DO, [facility] 12/22/2022, 9:06 AM Office: [phone]

## 2023-01-09 ENCOUNTER — Ambulatory Visit: Payer: Medicaid Other | Admitting: Internal Medicine

## 2023-01-09 ENCOUNTER — Encounter: Payer: Self-pay | Admitting: Internal Medicine

## 2023-01-09 VITALS — BP 126/84 | HR 70 | Ht 65.0 in | Wt 303.0 lb

## 2023-01-09 DIAGNOSIS — E041 Nontoxic single thyroid nodule: Secondary | ICD-10-CM

## 2023-01-09 DIAGNOSIS — E063 Autoimmune thyroiditis: Secondary | ICD-10-CM

## 2023-01-09 DIAGNOSIS — Z7984 Long term (current) use of oral hypoglycemic drugs: Secondary | ICD-10-CM

## 2023-01-09 DIAGNOSIS — E1165 Type 2 diabetes mellitus with hyperglycemia: Secondary | ICD-10-CM | POA: Diagnosis not present

## 2023-01-09 DIAGNOSIS — Z7985 Long-term (current) use of injectable non-insulin antidiabetic drugs: Secondary | ICD-10-CM | POA: Diagnosis not present

## 2023-01-09 DIAGNOSIS — E119 Type 2 diabetes mellitus without complications: Secondary | ICD-10-CM

## 2023-01-09 DIAGNOSIS — E038 Other specified hypothyroidism: Secondary | ICD-10-CM

## 2023-01-09 LAB — POCT GLYCOSYLATED HEMOGLOBIN (HGB A1C): Hemoglobin A1C: 5.7 % — AB (ref 4.0–5.6)

## 2023-01-09 MED ORDER — OZEMPIC (2 MG/DOSE) 8 MG/3ML ~~LOC~~ SOPN: 2.0000 mg | PEN_INJECTOR | SUBCUTANEOUS | 3 refills | Status: DC

## 2023-01-09 NOTE — Progress Notes (Signed)
Patient ID: Sarah English, female   DOB: Mar 03, 1976, 47 y.o.   MRN: [id number]  HPI  Sarah English is a 47 y.o.-year-old female, returning for f/u for Hashimoto's thyroiditis, thyroid nodule, DM2, dx'ed 08/2021, non-insulin-dependent.  Last visit 7 months ago.  Interim history: She continues to have pain and swelling in her feet-difficult to exercise.  She started chair exercises since last visit. She sees a new rheumatologist >> was diagnosed with psoriatic arthritis  >> on Cosentix >> stopped b/c frequent infections. She will switch to another medication soon. She lost 10 more lbs since last OV.  She is interested in increasing Ozempic, though, due to breakthrough hunger especially at night.  Hashimoto's hypothyroidism: Reviewed and addended history: Pt. has been dx with thyroiditis based on the aspect of the thyroid at the time of a thyroid U/S: heterogenous aspect, c/w thyroiditis. Afterwards, antithyroid antibodies also returned elevated.  In the past, she was taking Tri-iodine and Tyrosine.  We stopped these.  As the TSH was slightly high in 07/2014, we started her on levothyroxine, but subsequent TSH is slightly suppressed.  We had to stop levothyroxine then.  She has been off levothyroxine for several years until 12/2021, when we restarted LT4 50 mcg daily.  She did not return for labs after restarting this.  We started selenium 200 mcg daily in 2019 - feels better on this.    Pt takes levothyroxine 50 mcg daily: - in am - fasting - at least 30 min from b'fast - no calcium - no iron - no multivitamins - no PPIs - not on Biotin  Reviewed her TFTs Lab Results  Component Value Date   TSH 1.34 06/06/2022   TSH 8.55 (H) 12/29/2021   TSH 3.58 06/06/2021   TSH 2.52 01/14/2020   TSH 3.54 12/27/2018   FREET4 0.89 06/06/2022   FREET4 0.62 12/29/2021   FREET4 1.17 06/06/2021   FREET4 0.69 01/14/2020   FREET4 0.78 12/27/2018   Lab Results  Component Value Date   T3FREE 3.1  12/29/2021   T3FREE 5.9 (H) 06/06/2021   T3FREE 3.1 01/14/2020   T3FREE 3.2 12/27/2018   T3FREE 3.5 01/28/2018   T3FREE 3.2 07/30/2014   T3FREE 2.3 03/26/2014   T3FREE 2.8 02/10/2014   T3FREE 3.1 11/11/2013   Her TPO antibodies were elevated: Component     Latest Ref Rng & Units 01/14/2020  Thyroperoxidase Ab SerPl-aCnc     <9 IU/mL 13 (H)   Component     Latest Ref Rng & Units 11/11/2013 01/28/2018 12/27/2018  Thyroperoxidase Ab SerPl-aCnc     <9 IU/mL 74.0 (H) 26 (H) 22 (H)   Thyroid nodule:  Pt denies: - hoarseness  - choking  Tyroid ultrasound report (04/15/2013): Right thyroid lobe:  4.5 x 2.1 x 1.6 cm Left thyroid lobe:  4.5 x 1.6 x 2.1 cm Isthmus:  6 mm   Focal nodules:  Thyroid echotexture is diffusely heterogeneous. Thyroid morphology is diffusely lobular.   5 mm hyperechoic focus in the interpolar left lobe on image 24.   Otherwise, no measurable focal solid or cystic nodules seen.   Diffusely increased vascularity.   Lymphadenopathy:  None visualized.   IMPRESSION: Lobular thyroid diffusely with heterogeneous echotexture and suggestion of increased vascularity.  Appearance is nonspecific and could be related to thyroiditis or its sequela.  Thyroid U/S (09/30/2021): Parenchymal Echotexture: Mildly heterogenous Isthmus: 7 mm  Right lobe: 4.1 x 1.9 x 1.0 cm  Left lobe: 3.8 x 1.5 x 1.8 cm _________________________________________________________  Nonspecific diffuse mild thyroid heterogeneity without hypervascularity, discrete mass, or nodule. No regional adenopathy.   IMPRESSION: Mild thyroid heterogeneity compatible with medical thyroid disease. Negative for nodule.  No FH of thyroid cancer. No h/o radiation tx to head or neck. No Biotin use. No recent steroids use.   DM2: - dx 08/2021 - Previously with increased thirst and increased urination.   - She has a family history of diabetes in mother, father, grandmother.    She is on: - Farxiga 5 mg  before b'fast - started 2022 by cardiology - Ozempic 0.25 >> 0.5 >> 1 mg weekly (last dose increase in 02/2022) We had to stop metformin due to diarrhea even with the lowest dose of metformin ER.  Reviewed HbA1c levels: Lab Results  Component Value Date   HGBA1C 5.8 (A) 06/06/2022   HGBA1C 7.1 (A) 09/16/2021   HGBA1C 7.3 (H) 06/06/2021  12/29/2021: HbA1c 6.9% 12/13/2021: HbA1c 6.6%  After she came off her CGM, she was not checking blood sugars.  I strongly advised her to start. - am: 120s-160, 298 >> 102-118, 131 - 2h after b'fast: 150s  >. N/c - lunch: 150-180 >> 94-113 - 2h after lunch: n/c>> 129-167 - dinner: 93, 112-138 >> 100-128 - 2h after dinner: n/c >> 163, 174 - bedtime: cookie + tea >> 128-172  Reviewed BUN/creatinine: 01/23/2022:  Ref Range & Units   Glucose 70 - 110 mg/dL 75   Sodium 161 - 096 mmol/L 138   Potassium 3.6 - 5.1 mmol/L 4.5   Chloride 97 - 109 mmol/L 104   Carbon Dioxide (CO2) 22.0 - 32.0 mmol/L 27.8   Urea Nitrogen (BUN) 7 - 25 mg/dL 15   Creatinine 0.6 - 1.1 mg/dL 0.9   Glomerular Filtration Rate (eGFR), MDRD Estimate >60 mL/min/1.73sq m 67   Calcium 8.7 - 10.3 mg/dL 9.6   AST  8 - 39 U/L 54 High    ALT  5 - 38 U/L 121 High    Alk Phos (alkaline Phosphatase) 34 - 104 U/L 86   Albumin 3.5 - 4.8 g/dL 4.6   Bilirubin, Total 0.3 - 1.2 mg/dL 0.8   Protein, Total 6.1 - 7.9 g/dL 7.8   A/G Ratio 1.0 - 5.0 gm/dL 1.4    Lab Results  Component Value Date   BUN 13 07/28/2021   Lab Results  Component Value Date   CREATININE 1.11 (H) 07/28/2021  She is on Entresto.  No lipid panel available for review -but she had a lipid panel checked  by PCP before last visit.  We were not able to obtain records. Lab Results  Component Value Date   CHOL 163 06/06/2022   HDL 43.70 06/06/2022   LDLCALC 87 06/06/2022   TRIG 163.0 (H) 06/06/2022   CHOLHDL 4 06/06/2022   Last eye exam: 06/2022 - No DR reportedly.   Last foot exam was at last visit.  She also has  MCTD, psoriatic arthritis, Sjogren's syndrome, Raynauds, also vitamin D deficiency (2000 IU daily)-all managed by her rheumatologist. She had a MRI, then lumbar puncture performed for R leg weakness with falls >> no MS. In 2019, she was diagnosed with LBBB + NICMP (EF reduced, 30%), also pulmonary HTN per review of the results of her  catheterization. She had a pacemaker and a defibrillator placed. She had cardiac rehab.  She is a single mom.  ROS: + See HPI  I reviewed pt's medications, allergies, PMH, social hx, family hx, and changes were documented in the history  of present illness. Otherwise, unchanged from my initial visit note.  Past Medical History:  Diagnosis Date   Anemia    Autoimmune disorder (HCC)    Bronchitis    hx -uses inhaler prn   Chronic kidney disease    kidney infections   Endometriosis    Fibromyalgia    GERD (gastroesophageal reflux disease)    occasional   Hashimoto thyroiditis, fibrous variant    Hashimoto's disease    Headache    migraines   History of kidney stones    Hypertension    IVCD (intraventricular conduction defect)    LBBB (left bundle branch block)    Nonischemic cardiomyopathy (HCC)    per stress test 12-17-2017  ef 14%;  per echo 12-13-2017 ef 21%   Peripheral vascular disease (HCC)    raynoids syndrome   Pneumonia    only x 1 - 2- 3 months ago   Pulmonary hypertension (HCC)    per cardiac cath 12-18-2017--- moderate WHO Grp II     Scleroderma (HCC)    Sjogren's syndrome (HCC)    Sleep apnea    recently dx 04/13/18, does not have CPAP machine yet.   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, NYHA class 3 [facility])    cardiologist-  dr Evlyn Clines patwardhan   Tendinitis    lower back   Thyroiditis    Past Surgical History:  Procedure Laterality Date   BIV ICD INSERTION CRT-D N/A 10/25/2018   Procedure: BIV ICD INSERTION CRT-D;  Surgeon: Marinus Maw, MD;  Location: [facility] INVASIVE CV LAB;  Service:  Cardiovascular;  Laterality: N/A;   CESAREAN SECTION  2004   x 1   CHOLECYSTECTOMY     COLONOSCOPY     COLONOSCOPY WITH PROPOFOL N/A 07/08/2020   Procedure: COLONOSCOPY WITH PROPOFOL;  Surgeon: Kathi Der, MD;  Location: [facility] ENDOSCOPY;  Service: Gastroenterology;  Laterality: N/A;   CYSTOSCOPY     x 2 - bladder as a child   HYSTEROSCOPY WITH D & C N/A 04/16/2018   Procedure: DILATATION AND CURETTAGE /HYSTEROSCOPY;  Surgeon: Lavina Hamman, MD;  Location: [facility] ORS;  Service: Gynecology;  Laterality: N/A;   LAPAROSCOPIC LYSIS OF ADHESIONS  04/16/2018   Procedure: LAPAROSCOPIC LYSIS OF ADHESIONS;  Surgeon: Lavina Hamman, MD;  Location: [facility] ORS;  Service: Gynecology;;   MANDIBLE FRACTURE SURGERY  1994   upper mandibular   POLYPECTOMY  07/08/2020   Procedure: POLYPECTOMY;  Surgeon: Kathi Der, MD;  Location: [facility] ENDOSCOPY;  Service: Gastroenterology;;   RIGHT/LEFT HEART CATH AND CORONARY ANGIOGRAPHY N/A 12/18/2017   Procedure: RIGHT/LEFT HEART CATH AND CORONARY ANGIOGRAPHY;  Surgeon: Elder Negus, MD;  Location: [facility] INVASIVE CV LAB;  Service: Cardiovascular;  Laterality: N/A;   UPPER GI ENDOSCOPY     WISDOM TOOTH EXTRACTION     Social History   Socioeconomic History   Marital status: Single    Spouse name: Not on file   Number of children: 1   Years of education: 14   Highest education level: Associate degree: occupational, Scientist, product/process development, or vocational program  Occupational History   Occupation: not working  Tobacco Use   Smoking status: Former    Packs/day: 0.50    Years: 8.00    Additional pack years: 0.00    Total pack years: 4.00    Types: Cigarettes    Quit date: 12/10/2012    Years since quitting: 10.0   Smokeless tobacco: Never  Vaping Use   Vaping  Use: Never used  Substance and Sexual Activity   Alcohol use: Yes    Comment: once a year   Drug use: No   Sexual activity: Yes    Birth control/protection: Pill  Other Topics Concern   Not on file  Social  History Narrative   Lives with son, renting a room from her mother.  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     Education: college.    Right handed   1 coffee/tea  a day   Social Determinants of Health   Financial Resource Strain: Not on file  Food Insecurity: Not on file  Transportation Needs: Not on file  Physical Activity: Not on file  Stress: Not on file  Social Connections: Not on file  Intimate Partner Violence: Not on file   Current Outpatient Medications on File Prior to Visit  Medication Sig Dispense Refill   albuterol (PROVENTIL HFA;VENTOLIN HFA) 108 (90 Base) MCG/ACT inhaler Inhale 1 puff into the lungs every 4 (four) hours as needed for wheezing or shortness of breath.      cetirizine (ZYRTEC) 10 MG tablet Take 10 mg by mouth daily.     cyclobenzaprine (FLEXERIL) 10 MG tablet Take 10 mg by mouth. As needed     cycloSPORINE (RESTASIS) 0.05 % ophthalmic emulsion Place 1 drop into both eyes 2 (two) times daily.      FARXIGA 5 MG TABS tablet TAKE 1 TABLET BY MOUTH EVERY DAY 90 tablet 1   furosemide (LASIX) 20 MG tablet Take 1 tablet (20 mg total) by mouth daily as needed. 90 tablet 1   gabapentin (NEURONTIN) 300 MG capsule Take 1 tablet in the AM and 2 tablets at bedtime x 1 week, then increase to 2 tablet twice daily. 360 capsule 3   hyoscyamine (LEVSIN SL) 0.125 MG SL tablet Place 0.125 mg under the tongue every 4 (four) hours as needed for cramping.     ivabradine (CORLANOR) 7.5 MG TABS tablet TAKE 1 TABLET (7.5 MG TOTAL) BY MOUTH 2 (TWO) TIMES DAILY WITH A MEAL. 60 tablet 3   levothyroxine (SYNTHROID) 50 MCG tablet TAKE 1 TABLET BY MOUTH DAILY BEFORE BREAKFAST 90 tablet 0   metoprolol succinate (TOPROL-XL) 50 MG 24 hr tablet TAKE 1 TABLET BY MOUTH DAILY. TAKE WITH OR IMMEDIATELY FOLLOWING A MEAL. 90 tablet 1   montelukast (SINGULAIR) 10 MG tablet Take 1 tablet by mouth daily.     ondansetron (ZOFRAN-ODT) 4 MG disintegrating tablet  ondansetron 4 mg disintegrating tablet  PLACE 1 TABLET ON THE TONGUE AND ALLOW TO DISSOLVE EVERY 8 HOURS AS NEEDED (Patient not taking: Reported on 12/22/2022)     Probiotic Product (PROBIOTIC PO) Take 1 capsule by mouth daily.     sacubitril-valsartan (ENTRESTO) 97-103 MG Take 1 tablet by mouth 2 (two) times daily. 60 tablet 5   selenium (SELENIMIN-200) 200 MCG TABS tablet Take 200 mcg by mouth daily.      Semaglutide, 1 MG/DOSE, 4 MG/3ML SOPN Inject 1 mg as directed once a week. 9 mL 3   silver sulfADIAZINE (SILVADENE) 1 % cream Apply 1 application  topically 2 (two) times daily as needed (wounds/affected area(s) of skin.).     sodium chloride (OCEAN) 0.65 % SOLN nasal spray Place 1 spray into both nostrils as needed for congestion.     spironolactone (ALDACTONE) 50 MG tablet Take 1 tablet (50 mg total) by mouth daily. 90 tablet 2   SUMAtriptan (IMITREX) 100  MG tablet Take 1 tablet (100 mg total) by mouth 2 (two) times daily as needed for migraine. 10 tablet 11   traMADol (ULTRAM) 50 MG tablet 1 tablet as needed     No current facility-administered medications on file prior to visit.   Allergies  Allergen Reactions   Sulfa Antibiotics Nausea And Vomiting   Wasp Venom Other (See Comments)   Wasp Venom Protein Other (See Comments)   Family History  Problem Relation Age of Onset   Diabetes Mother        Type II   Heart disease Mother        stent surgery   Hypertension Mother    Hyperlipidemia Mother    Diabetes Father        Type II   Hypertension Father    Hyperlipidemia Father     PE: BP 126/84 (BP Location: Right Arm, Patient Position: Sitting, Cuff Size: Normal)   Pulse 70   Ht 5\' 5"  (1.651 m)   Wt (!) 303 lb (137.4 kg)   SpO2 96%   BMI 50.42 kg/m   Wt Readings from Last 3 Encounters:  01/09/23 (!) 303 lb (137.4 kg)  12/22/22 (!) 301 lb (136.5 kg)  06/06/22 (!) 313 lb 3.2 oz (142.1 kg)   Constitutional: overweight, in NAD Eyes:  EOMI, no exophthalmos ENT: no neck  masses, no cervical lymphadenopathy Cardiovascular: RRR, No MRG Respiratory: CTA B Musculoskeletal: no deformities Skin:no rashes Neurological: no tremor with outstretched hands  ASSESSMENT: 1. Hashimoto's thyroiditis  2.  Thyroid nodule  3.  Type 2 diabetes - FH of DM  PLAN:  1. Patient with history of Hashimoto's thyroiditis, on levothyroxine.  She also continues selenium 200 mcg daily. -She has intermittent pressure in the neck most likely due to Hashimoto's thyroid inflammation - latest thyroid labs reviewed with pt. >> normal: Lab Results  Component Value Date   TSH 1.34 06/06/2022  - she continues on LT4 50 mcg daily - pt feels good on this dose. - we discussed about taking the thyroid hormone every day, with water, >30 minutes before breakfast, separated by >4 hours from acid reflux medications, calcium, iron, multivitamins. Pt. is taking it correctly. - will check thyroid tests today: TSH and fT4 - If labs are abnormal, she will need to return for repeat TFTs at next visit  2.  Thyroid nodule -She previously had neck pressure, but this was deemed to be most likely related to her Hashimoto's thyroiditis rather than the small nodule, measuring 5 mm on the ultrasound from 2014 -At the beginning of last year she again describes dysphagia and pressure in the neck.  Another thyroid ultrasound was checked and this did not show any abnormalities beyond heterogeneity consistent with Hashimoto's thyroiditis. -Will continue selenium which can help decrease thyroid inflammation -We can continue to monitor her expectantly  3.  Type 2 diabetes -At last visit she was on a lower dose of metformin due to diarrhea and fatigue with the higher doses, but she mentioned the diarrhea actually persisted even on the lower doses.  We stopped the medication at that time.  She is currently on low-dose SGLT2 inhibitor and weekly GLP-1 receptor agonist, with good tolerance. -At last visit, HbA1c was  better, at 5.8%.  She was not checking blood sugars and I advised her to start checking every day or every other day, rotating check times. -At today's visit, sugars are mostly at goal, improved from before.  She is interested in increasing Ozempic  for diabetes control but also to reduce her appetite and help more with weight loss.  Will increase the dose to 2 mg weekly today. - I advised her to: Patient Instructions  Please continue: - Farxiga 5 mg before b'fast  You can increase:  - Ozempic 2 mg weekly  Continue Levothyroxine 50 mcg daily.  Take the thyroid hormone every day, with water, at least 30 minutes before breakfast, separated by at least 4 hours from: - acid reflux medications - calcium - iron - multivitamins  You should have an endocrinology follow-up appointment in 6 months.  - we checked her HbA1c: 5.7% (lower) - advised for yearly eye exams >> she is UTD - return to clinic in 6 months  Carlus Pavlov, MD PhD [facility] Endocrinology

## 2023-01-09 NOTE — Patient Instructions (Addendum)
Please continue: - Farxiga 5 mg before b'fast  You can increase:  - Ozempic 2 mg weekly  Continue Levothyroxine 50 mcg daily.  Take the thyroid hormone every day, with water, at least 30 minutes before breakfast, separated by at least 4 hours from: - acid reflux medications - calcium - iron - multivitamins  You should have an endocrinology follow-up appointment in 6 months.

## 2023-01-20 ENCOUNTER — Other Ambulatory Visit: Payer: Self-pay | Admitting: Cardiology

## 2023-01-20 DIAGNOSIS — I428 Other cardiomyopathies: Secondary | ICD-10-CM

## 2023-01-20 DIAGNOSIS — I5022 Chronic systolic (congestive) heart failure: Secondary | ICD-10-CM

## 2023-01-24 ENCOUNTER — Other Ambulatory Visit: Payer: Self-pay

## 2023-01-24 DIAGNOSIS — I428 Other cardiomyopathies: Secondary | ICD-10-CM

## 2023-01-24 DIAGNOSIS — I5022 Chronic systolic (congestive) heart failure: Secondary | ICD-10-CM

## 2023-01-24 MED ORDER — METOPROLOL SUCCINATE ER 50 MG PO TB24: 50.0000 mg | ORAL_TABLET | Freq: Every day | ORAL | 2 refills | Status: DC

## 2023-01-24 MED ORDER — DAPAGLIFLOZIN PROPANEDIOL 5 MG PO TABS
5.0000 mg | ORAL_TABLET | Freq: Every day | ORAL | 2 refills | Status: DC
Start: 2023-01-24 — End: 2023-10-12

## 2023-01-24 MED ORDER — SPIRONOLACTONE 50 MG PO TABS
50.0000 mg | ORAL_TABLET | Freq: Every day | ORAL | 2 refills | Status: DC
Start: 2023-01-24 — End: 2023-08-31

## 2023-02-06 ENCOUNTER — Ambulatory Visit (INDEPENDENT_AMBULATORY_CARE_PROVIDER_SITE_OTHER): Payer: Medicaid Other

## 2023-02-06 DIAGNOSIS — I5022 Chronic systolic (congestive) heart failure: Secondary | ICD-10-CM | POA: Diagnosis not present

## 2023-02-06 DIAGNOSIS — I428 Other cardiomyopathies: Secondary | ICD-10-CM

## 2023-02-07 LAB — CUP PACEART REMOTE DEVICE CHECK
Battery Remaining Longevity: 47 mo
Battery Remaining Percentage: 51 %
Battery Voltage: 2.95 V
Brady Statistic AP VP Percent: 4.2 %
Brady Statistic AP VS Percent: 1 %
Brady Statistic AS VP Percent: 95 %
Brady Statistic AS VS Percent: 1 %
Brady Statistic RA Percent Paced: 4.7 %
Date Time Interrogation Session: 20240618020016
HighPow Impedance: 80 Ohm
HighPow Impedance: 80 Ohm
Implantable Lead Connection Status: 753985
Implantable Lead Connection Status: 753985
Implantable Lead Connection Status: 753985
Implantable Lead Implant Date: 20200306
Implantable Lead Implant Date: 20200306
Implantable Lead Implant Date: 20200306
Implantable Lead Location: 753858
Implantable Lead Location: 753859
Implantable Lead Location: 753860
Implantable Lead Model: 7122
Implantable Pulse Generator Implant Date: 20200306
Lead Channel Impedance Value: 1100 Ohm
Lead Channel Impedance Value: 510 Ohm
Lead Channel Impedance Value: 510 Ohm
Lead Channel Pacing Threshold Amplitude: 0.5 V
Lead Channel Pacing Threshold Amplitude: 0.625 V
Lead Channel Pacing Threshold Amplitude: 1.25 V
Lead Channel Pacing Threshold Pulse Width: 0.5 ms
Lead Channel Pacing Threshold Pulse Width: 0.5 ms
Lead Channel Pacing Threshold Pulse Width: 0.5 ms
Lead Channel Sensing Intrinsic Amplitude: 11.9 mV
Lead Channel Sensing Intrinsic Amplitude: 5 mV
Lead Channel Setting Pacing Amplitude: 1.625
Lead Channel Setting Pacing Amplitude: 2 V
Lead Channel Setting Pacing Amplitude: 2.25 V
Lead Channel Setting Pacing Pulse Width: 0.5 ms
Lead Channel Setting Pacing Pulse Width: 0.5 ms
Lead Channel Setting Sensing Sensitivity: 0.5 mV
Pulse Gen Serial Number: 9878925
Zone Setting Status: 755011

## 2023-02-12 ENCOUNTER — Encounter: Payer: Self-pay | Admitting: Neurology

## 2023-02-28 NOTE — Progress Notes (Signed)
Remote ICD transmission.   

## 2023-03-04 ENCOUNTER — Other Ambulatory Visit: Payer: Self-pay | Admitting: Cardiology

## 2023-03-11 ENCOUNTER — Other Ambulatory Visit: Payer: Self-pay | Admitting: Internal Medicine

## 2023-04-21 ENCOUNTER — Other Ambulatory Visit: Payer: Self-pay | Admitting: Internal Medicine

## 2023-04-21 DIAGNOSIS — I5022 Chronic systolic (congestive) heart failure: Secondary | ICD-10-CM

## 2023-04-25 ENCOUNTER — Other Ambulatory Visit: Payer: Self-pay | Admitting: Cardiology

## 2023-04-25 DIAGNOSIS — I5022 Chronic systolic (congestive) heart failure: Secondary | ICD-10-CM

## 2023-04-30 ENCOUNTER — Other Ambulatory Visit: Payer: Self-pay | Admitting: Cardiology

## 2023-04-30 DIAGNOSIS — I5022 Chronic systolic (congestive) heart failure: Secondary | ICD-10-CM

## 2023-05-01 ENCOUNTER — Other Ambulatory Visit: Payer: Self-pay

## 2023-05-07 ENCOUNTER — Ambulatory Visit: Payer: Medicaid Other | Admitting: Neurology

## 2023-05-08 ENCOUNTER — Ambulatory Visit (INDEPENDENT_AMBULATORY_CARE_PROVIDER_SITE_OTHER): Payer: Medicaid Other

## 2023-05-08 DIAGNOSIS — I5022 Chronic systolic (congestive) heart failure: Secondary | ICD-10-CM

## 2023-05-08 DIAGNOSIS — I428 Other cardiomyopathies: Secondary | ICD-10-CM

## 2023-05-08 LAB — CUP PACEART REMOTE DEVICE CHECK
Battery Remaining Longevity: 46 mo
Battery Remaining Percentage: 49 %
Battery Voltage: 2.95 V
Brady Statistic AP VP Percent: 3.5 %
Brady Statistic AP VS Percent: 1 %
Brady Statistic AS VP Percent: 96 %
Brady Statistic AS VS Percent: 1 %
Brady Statistic RA Percent Paced: 4 %
Date Time Interrogation Session: 20240917020017
HighPow Impedance: 83 Ohm
HighPow Impedance: 83 Ohm
Implantable Lead Connection Status: 753985
Implantable Lead Connection Status: 753985
Implantable Lead Connection Status: 753985
Implantable Lead Implant Date: 20200306
Implantable Lead Implant Date: 20200306
Implantable Lead Implant Date: 20200306
Implantable Lead Location: 753858
Implantable Lead Location: 753859
Implantable Lead Location: 753860
Implantable Lead Model: 7122
Implantable Pulse Generator Implant Date: 20200306
Lead Channel Impedance Value: 1050 Ohm
Lead Channel Impedance Value: 510 Ohm
Lead Channel Impedance Value: 530 Ohm
Lead Channel Pacing Threshold Amplitude: 0.5 V
Lead Channel Pacing Threshold Amplitude: 1 V
Lead Channel Pacing Threshold Amplitude: 1.375 V
Lead Channel Pacing Threshold Pulse Width: 0.5 ms
Lead Channel Pacing Threshold Pulse Width: 0.5 ms
Lead Channel Pacing Threshold Pulse Width: 0.5 ms
Lead Channel Sensing Intrinsic Amplitude: 12 mV
Lead Channel Sensing Intrinsic Amplitude: 5 mV
Lead Channel Setting Pacing Amplitude: 2 V
Lead Channel Setting Pacing Amplitude: 2 V
Lead Channel Setting Pacing Amplitude: 2.375
Lead Channel Setting Pacing Pulse Width: 0.5 ms
Lead Channel Setting Pacing Pulse Width: 0.5 ms
Lead Channel Setting Sensing Sensitivity: 0.5 mV
Pulse Gen Serial Number: 9878925
Zone Setting Status: 755011

## 2023-05-12 ENCOUNTER — Other Ambulatory Visit: Payer: Self-pay | Admitting: Neurology

## 2023-05-23 ENCOUNTER — Ambulatory Visit: Payer: Medicaid Other | Admitting: Neurology

## 2023-05-24 NOTE — Progress Notes (Signed)
Remote ICD transmission.   

## 2023-06-12 ENCOUNTER — Ambulatory Visit: Payer: Medicaid Other | Admitting: Neurology

## 2023-06-12 ENCOUNTER — Encounter: Payer: Self-pay | Admitting: Neurology

## 2023-06-12 VITALS — BP 106/71 | HR 89 | Ht 65.0 in | Wt 292.0 lb

## 2023-06-12 DIAGNOSIS — R202 Paresthesia of skin: Secondary | ICD-10-CM

## 2023-06-12 DIAGNOSIS — G44229 Chronic tension-type headache, not intractable: Secondary | ICD-10-CM

## 2023-06-12 DIAGNOSIS — H8110 Benign paroxysmal vertigo, unspecified ear: Secondary | ICD-10-CM

## 2023-06-12 MED ORDER — NURTEC 75 MG PO TBDP: ORAL_TABLET | ORAL | 5 refills | Status: AC

## 2023-06-12 MED ORDER — GABAPENTIN 300 MG PO CAPS: 600.0000 mg | ORAL_CAPSULE | Freq: Two times a day (BID) | ORAL | 3 refills | Status: DC

## 2023-06-12 MED ORDER — MECLIZINE HCL 12.5 MG PO TABS: 12.5000 mg | ORAL_TABLET | Freq: Two times a day (BID) | ORAL | 1 refills | Status: DC | PRN
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
## 2023-06-12 NOTE — Patient Instructions (Signed)
For migraines, we will stop imitrex and start nurtec.  For dizziness, you can take meclizine as needed  Continue gabapentin 600mg  twice daily

## 2023-06-26 ENCOUNTER — Other Ambulatory Visit: Payer: Self-pay | Admitting: Internal Medicine

## 2023-06-28 ENCOUNTER — Other Ambulatory Visit (HOSPITAL_COMMUNITY): Payer: Self-pay

## 2023-07-05 ENCOUNTER — Ambulatory Visit: Payer: Medicaid Other | Admitting: Internal Medicine

## 2023-07-09 ENCOUNTER — Telehealth: Payer: Self-pay | Admitting: Neurology

## 2023-07-09 NOTE — Telephone Encounter (Signed)
Pt called in and left a message with the access nurse. She stated she was prescribed a medication that needs prior authorization. The pharmacy should have sent something over to [place] a while ago but has not heard anything. She would like the status.

## 2023-07-13 ENCOUNTER — Other Ambulatory Visit: Payer: Self-pay

## 2023-07-13 DIAGNOSIS — I5022 Chronic systolic (congestive) heart failure: Secondary | ICD-10-CM

## 2023-07-13 MED ORDER — IVABRADINE HCL 7.5 MG PO TABS: 7.5000 mg | ORAL_TABLET | Freq: Two times a day (BID) | ORAL | 6 refills | Status: DC

## 2023-07-16 ENCOUNTER — Other Ambulatory Visit: Payer: Self-pay | Admitting: Cardiology

## 2023-07-16 ENCOUNTER — Telehealth: Payer: Self-pay | Admitting: Pharmacy Technician

## 2023-07-16 ENCOUNTER — Other Ambulatory Visit (HOSPITAL_COMMUNITY): Payer: Self-pay

## 2023-07-16 NOTE — Telephone Encounter (Addendum)
Pharmacy Patient Advocate Encounter   Received notification from Pt Calls Messages that prior authorization for Nurtec is required/requested.   Insurance verification completed.   The patient is insured through [employer] .   Per test claim: PA submitted and APPROVED from 07/16/23 to 07/15/24. Ran test claim, Copay is $4. This test claim was processed through [facility]- copay amounts may vary at other pharmacies due to pharmacy/plan contracts, or as the patient moves through the different stages of their insurance plan.   Key: [id number]

## 2023-07-16 NOTE — Telephone Encounter (Signed)
PA request has been Approved. New Encounter created for follow up. For additional info see Pharmacy Prior Auth telephone encounter from 07/16/23.

## 2023-07-16 NOTE — Telephone Encounter (Signed)
Called patient and informed her of approval below of her Nurtec. Patient verbalized understanding and had no further questions or concerns.

## 2023-07-17 ENCOUNTER — Encounter: Payer: Self-pay | Admitting: Internal Medicine

## 2023-07-17 ENCOUNTER — Ambulatory Visit: Payer: Medicaid Other | Admitting: Internal Medicine

## 2023-07-17 VITALS — BP 122/70 | HR 88 | Ht 65.0 in | Wt 287.8 lb

## 2023-07-17 DIAGNOSIS — E041 Nontoxic single thyroid nodule: Secondary | ICD-10-CM

## 2023-07-17 DIAGNOSIS — E063 Autoimmune thyroiditis: Secondary | ICD-10-CM

## 2023-07-17 DIAGNOSIS — E1165 Type 2 diabetes mellitus with hyperglycemia: Secondary | ICD-10-CM | POA: Diagnosis not present

## 2023-07-17 LAB — POCT GLYCOSYLATED HEMOGLOBIN (HGB A1C): Hemoglobin A1C: 5.7 % — AB (ref 4.0–5.6)

## 2023-07-17 NOTE — Patient Instructions (Addendum)
Please continue: - Farxiga 5 mg before b'fast - Ozempic 2 mg weekly  Continue Levothyroxine 50 mcg daily.  Take the thyroid hormone every day, with water, at least 30 minutes before breakfast, separated by at least 4 hours from: - acid reflux medications - calcium - iron - multivitamins  Please stop at the lab.  You should have an endocrinology follow-up appointment in 6 months.

## 2023-07-17 NOTE — Progress Notes (Signed)
Patient ID: Sarah English, female   DOB: 02/15/1976, 47 y.o.   MRN: [id number]  HPI  Sarah English is a 47 y.o.-year-old female, returning for f/u for Hashimoto's thyroiditis, thyroid nodule, DM2, dx'ed 08/2021, non-insulin-dependent.  Last visit 6 months ago.  Interim history: She started to walk for exercise. She has increased urination, but no nausea, chest pain, blurry vision. She lost 13 more pounds since last visit after increasing Ozempic. Her mother just had open heart surgery. She was busy - did not check sugars.  Hashimoto's hypothyroidism: Reviewed and addended history: Pt. has been dx with thyroiditis based on the aspect of the thyroid at the time of a thyroid U/S: heterogenous aspect, c/w thyroiditis. Afterwards, antithyroid antibodies also returned elevated.  In the past, she was taking Tri-iodine and Tyrosine.  We stopped these.  As the TSH was slightly high in 07/2014, we started her on levothyroxine, but subsequent TSH is slightly suppressed.  We had to stop levothyroxine then.  She has been off levothyroxine for several years until 12/2021, when we restarted LT4 50 mcg daily.  She did not return for labs after restarting this.  We started selenium 200 mcg daily in 2019 - feels better on this.    Pt takes levothyroxine 50 mcg daily: - in am - fasting - at least 30 min from b'fast - no calcium - no iron - no multivitamins - no PPIs - not on Biotin  Reviewed her TFTs Lab Results  Component Value Date   TSH 1.34 06/06/2022   TSH 8.55 (H) 12/29/2021   TSH 3.58 06/06/2021   TSH 2.52 01/14/2020   TSH 3.54 12/27/2018   FREET4 0.89 06/06/2022   FREET4 0.62 12/29/2021   FREET4 1.17 06/06/2021   FREET4 0.69 01/14/2020   FREET4 0.78 12/27/2018   Lab Results  Component Value Date   T3FREE 3.1 12/29/2021   T3FREE 5.9 (H) 06/06/2021   T3FREE 3.1 01/14/2020   T3FREE 3.2 12/27/2018   T3FREE 3.5 01/28/2018   T3FREE 3.2 07/30/2014   T3FREE 2.3 03/26/2014   T3FREE  2.8 02/10/2014   T3FREE 3.1 11/11/2013   Her TPO antibodies were elevated: Component     Latest Ref Rng & Units 01/14/2020  Thyroperoxidase Ab SerPl-aCnc     <9 IU/mL 13 (H)   Component     Latest Ref Rng & Units 11/11/2013 01/28/2018 12/27/2018  Thyroperoxidase Ab SerPl-aCnc     <9 IU/mL 74.0 (H) 26 (H) 22 (H)   Thyroid nodule:  Pt denies: - hoarseness  - choking  Tyroid ultrasound report (04/15/2013): Right thyroid lobe:  4.5 x 2.1 x 1.6 cm Left thyroid lobe:  4.5 x 1.6 x 2.1 cm Isthmus:  6 mm   Focal nodules:  Thyroid echotexture is diffusely heterogeneous. Thyroid morphology is diffusely lobular.   5 mm hyperechoic focus in the interpolar left lobe on image 24.   Otherwise, no measurable focal solid or cystic nodules seen.   Diffusely increased vascularity.   Lymphadenopathy:  None visualized.   IMPRESSION: Lobular thyroid diffusely with heterogeneous echotexture and suggestion of increased vascularity.  Appearance is nonspecific and could be related to thyroiditis or its sequela.  Thyroid U/S (09/30/2021): Parenchymal Echotexture: Mildly heterogenous Isthmus: 7 mm  Right lobe: 4.1 x 1.9 x 1.0 cm  Left lobe: 3.8 x 1.5 x 1.8 cm _________________________________________________________   Nonspecific diffuse mild thyroid heterogeneity without hypervascularity, discrete mass, or nodule. No regional adenopathy.   IMPRESSION: Mild thyroid heterogeneity compatible with medical thyroid disease. Negative  for nodule.  No FH of thyroid cancer. No h/o radiation tx to head or neck. No Biotin use. No recent steroids use.   DM2: - dx 08/2021 - Previously with increased thirst and increased urination.   - She has a family history of diabetes in mother, father, grandmother.    She is on: - Farxiga 5 mg before b'fast - started 2022 by cardiology - Ozempic 0.25 >> 0.5 >> 1 >> 2 mg weekly (last dose increase 12/2022) We had to stop metformin due to diarrhea even with the lowest  dose of metformin ER.  Reviewed HbA1c levels: Lab Results  Component Value Date   HGBA1C 5.7 (A) 01/09/2023   HGBA1C 5.8 (A) 06/06/2022   HGBA1C 7.1 (A) 09/16/2021   HGBA1C 7.3 (H) 06/06/2021  12/29/2021: HbA1c 6.9% 12/13/2021: HbA1c 6.6%  After she came off her CGM, she was not checking blood sugars, then started to check >> now stopped again in last 2 weeks: - am: 120s-160, 298 >> 102-118, 131 >> 89-99 - 2h after b'fast: 150s  >> N/c - lunch: 150-180 >> 94-113 >> n/c - 2h after lunch: n/c>> 129-167 >> n/c - dinner: 93, 112-138 >> 100-128 >> 100-110 - 2h after dinner: n/c >> 163, 174 >> n/c - bedtime: cookie + tea >> 128-172 >> n/c Lowest: high 60s Highest: 160  Reviewed BUN/creatinine: 01/23/2022:  Ref Range & Units   Glucose 70 - 110 mg/dL 75   Sodium 409 - 811 mmol/L 138   Potassium 3.6 - 5.1 mmol/L 4.5   Chloride 97 - 109 mmol/L 104   Carbon Dioxide (CO2) 22.0 - 32.0 mmol/L 27.8   Urea Nitrogen (BUN) 7 - 25 mg/dL 15   Creatinine 0.6 - 1.1 mg/dL 0.9   Glomerular Filtration Rate (eGFR), MDRD Estimate >60 mL/min/1.73sq m 67   Calcium 8.7 - 10.3 mg/dL 9.6   AST  8 - 39 U/L 54 High    ALT  5 - 38 U/L 121 High    Alk Phos (alkaline Phosphatase) 34 - 104 U/L 86   Albumin 3.5 - 4.8 g/dL 4.6   Bilirubin, Total 0.3 - 1.2 mg/dL 0.8   Protein, Total 6.1 - 7.9 g/dL 7.8   A/G Ratio 1.0 - 5.0 gm/dL 1.4    Lab Results  Component Value Date   BUN 13 07/28/2021   Lab Results  Component Value Date   CREATININE 1.11 (H) 07/28/2021  She is on Entresto.  + HL; latest lipid panel: Lab Results  Component Value Date   CHOL 163 06/06/2022   HDL 43.70 06/06/2022   LDLCALC 87 06/06/2022   TRIG 163.0 (H) 06/06/2022   CHOLHDL 4 06/06/2022   Last eye exam: 06/2022 - No DR reportedly.   Last foot exam was 06/06/2022: Diabetic Foot Exam - Simple   Simple Foot Form Diabetic Foot exam was performed with the following findings: Yes 06/06/2022  3:35 PM  Visual Inspection No  deformities, no ulcerations, no other skin breakdown bilaterally: Yes Sensation Testing Intact to touch and monofilament testing bilaterally: Yes Pulse Check Posterior Tibialis and Dorsalis pulse intact bilaterally: Yes Comments    She also has MCTD, psoriatic arthritis, Sjogren's syndrome, Raynauds, also vitamin D deficiency (2000 IU daily)-all managed by her rheumatologist. She had a MRI, then lumbar puncture performed for R leg weakness with falls >> no MS. In 2019, she was diagnosed with LBBB + NICMP (EF reduced, 30%), also pulmonary HTN per review of the results of her  catheterization. She had  a pacemaker and a defibrillator placed. She had cardiac rehab.  She is a single mom.  ROS: + See HPI  I reviewed pt's medications, allergies, PMH, social hx, family hx, and changes were documented in the history of present illness. Otherwise, unchanged from my initial visit note.  Past Medical History:  Diagnosis Date   Anemia    Autoimmune disorder (HCC)    Bronchitis    hx -uses inhaler prn   Chronic kidney disease    kidney infections   Endometriosis    Fibromyalgia    GERD (gastroesophageal reflux disease)    occasional   Hashimoto thyroiditis, fibrous variant    Hashimoto's disease    Headache    migraines   History of kidney stones    Hypertension    IVCD (intraventricular conduction defect)    LBBB (left bundle branch block)    Nonischemic cardiomyopathy (HCC)    per stress test 12-17-2017  ef 14%;  per echo 12-13-2017 ef 21%   Peripheral vascular disease (HCC)    raynoids syndrome   Pneumonia    only x 1 - 2- 3 months ago   Pulmonary hypertension (HCC)    per cardiac cath 12-18-2017--- moderate WHO Grp II     Scleroderma (HCC)    Sjogren's syndrome (HCC)    Sleep apnea    recently dx 04/13/18, does not have CPAP machine yet.   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, NYHA class 3 [facility])    cardiologist-  dr Evlyn Clines  patwardhan   Tendinitis    lower back   Thyroiditis    Past Surgical History:  Procedure Laterality Date   BIV ICD INSERTION CRT-D N/A 10/25/2018   Procedure: BIV ICD INSERTION CRT-D;  Surgeon: Marinus Maw, MD;  Location: [facility] INVASIVE CV LAB;  Service: Cardiovascular;  Laterality: N/A;   CESAREAN SECTION  2004   x 1   CHOLECYSTECTOMY     COLONOSCOPY     COLONOSCOPY WITH PROPOFOL N/A 07/08/2020   Procedure: COLONOSCOPY WITH PROPOFOL;  Surgeon: Kathi Der, MD;  Location: [facility] ENDOSCOPY;  Service: Gastroenterology;  Laterality: N/A;   CYSTOSCOPY     x 2 - bladder as a child   HYSTEROSCOPY WITH D & C N/A 04/16/2018   Procedure: DILATATION AND CURETTAGE /HYSTEROSCOPY;  Surgeon: Lavina Hamman, MD;  Location: [facility] ORS;  Service: Gynecology;  Laterality: N/A;   LAPAROSCOPIC LYSIS OF ADHESIONS  04/16/2018   Procedure: LAPAROSCOPIC LYSIS OF ADHESIONS;  Surgeon: Lavina Hamman, MD;  Location: [facility] ORS;  Service: Gynecology;;   MANDIBLE FRACTURE SURGERY  1994   upper mandibular   POLYPECTOMY  07/08/2020   Procedure: POLYPECTOMY;  Surgeon: Kathi Der, MD;  Location: [facility] ENDOSCOPY;  Service: Gastroenterology;;   RIGHT/LEFT HEART CATH AND CORONARY ANGIOGRAPHY N/A 12/18/2017   Procedure: RIGHT/LEFT HEART CATH AND CORONARY ANGIOGRAPHY;  Surgeon: Elder Negus, MD;  Location: [facility] INVASIVE CV LAB;  Service: Cardiovascular;  Laterality: N/A;   UPPER GI ENDOSCOPY     WISDOM TOOTH EXTRACTION     Social History   Socioeconomic History   Marital status: Single    Spouse name: Not on file   Number of children: 1   Years of education: 14   Highest education level: Associate degree: occupational, Scientist, product/process development, or vocational program  Occupational History   Occupation: not working  Tobacco Use   Smoking status: Former    Current packs/day: 0.00    Average packs/day: 0.5 packs/day for  8.0 years (4.0 ttl pk-yrs)    Types: Cigarettes    Start date: 12/10/2004    Quit date: 12/10/2012    Years  since quitting: 10.6   Smokeless tobacco: Never  Vaping Use   Vaping status: Never Used  Substance and Sexual Activity   Alcohol use: Yes    Comment: once a year   Drug use: No   Sexual activity: Yes    Birth control/protection: Pill  Other Topics Concern   Not on file  Social History Narrative   Lives with son, renting a room from her mother.  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     Education: college.    Right handed   1 coffee/tea  a day   Social Determinants of Health   Financial Resource Strain: Not on file  Food Insecurity: Not on file  Transportation Needs: Not on file  Physical Activity: Not on file  Stress: Not on file  Social Connections: Not on file  Intimate Partner Violence: Not on file   Current Outpatient Medications on File Prior to Visit  Medication Sig Dispense Refill   albuterol (PROVENTIL HFA;VENTOLIN HFA) 108 (90 Base) MCG/ACT inhaler Inhale 1 puff into the lungs every 4 (four) hours as needed for wheezing or shortness of breath.      cetirizine (ZYRTEC) 10 MG tablet Take 10 mg by mouth daily.     cyclobenzaprine (FLEXERIL) 10 MG tablet Take 10 mg by mouth. As needed     cycloSPORINE (RESTASIS) 0.05 % ophthalmic emulsion Place 1 drop into both eyes 2 (two) times daily.      dapagliflozin propanediol (FARXIGA) 5 MG TABS tablet Take 1 tablet (5 mg total) by mouth daily. 90 tablet 2   ENTRESTO 97-103 MG TAKE 1 TABLET BY MOUTH TWICE A DAY 60 tablet 5   furosemide (LASIX) 20 MG tablet TAKE 1 TABLET BY MOUTH EVERY DAY AS NEEDED 90 tablet 1   gabapentin (NEURONTIN) 300 MG capsule Take 2 capsules (600 mg total) by mouth 2 (two) times daily. 360 capsule 3   hyoscyamine (LEVSIN SL) 0.125 MG SL tablet Place 0.125 mg under the tongue every 4 (four) hours as needed for cramping.     ivabradine (CORLANOR) 7.5 MG TABS tablet Take 1 tablet (7.5 mg total) by mouth 2 (two) times daily with a meal. 60 tablet 6   levothyroxine  (SYNTHROID) 50 MCG tablet TAKE 1 TABLET BY MOUTH EVERY DAY BEFORE BREAKFAST 90 tablet 0   meclizine (ANTIVERT) 12.5 MG tablet Take 1 tablet (12.5 mg total) by mouth 2 (two) times daily as needed for dizziness. 30 tablet 1   metoprolol succinate (TOPROL-XL) 50 MG 24 hr tablet Take 1 tablet (50 mg total) by mouth daily. TAKE WITH OR IMMEDIATELY FOLLOWING A MEAL. 90 tablet 2   montelukast (SINGULAIR) 10 MG tablet Take 1 tablet by mouth daily.     ondansetron (ZOFRAN-ODT) 4 MG disintegrating tablet      OTEZLA 30 MG TABS Take 1 tablet by mouth 2 (two) times daily.     Probiotic Product (PROBIOTIC PO) Take 1 capsule by mouth daily.     Rimegepant Sulfate (NURTEC) 75 MG TBDP Take for severe migraine, limit to twice per week. 8 tablet 5   selenium (SELENIMIN-200) 200 MCG TABS tablet Take 200 mcg by mouth daily.      Semaglutide, 2 MG/DOSE, (OZEMPIC, 2 MG/DOSE,) 8 MG/3ML SOPN Inject 2 mg into the skin once  a week. 9 mL 3   silver sulfADIAZINE (SILVADENE) 1 % cream Apply 1 application  topically 2 (two) times daily as needed (wounds/affected area(s) of skin.).     sodium chloride (OCEAN) 0.65 % SOLN nasal spray Place 1 spray into both nostrils as needed for congestion.     spironolactone (ALDACTONE) 50 MG tablet Take 1 tablet (50 mg total) by mouth daily. 90 tablet 2   traMADol (ULTRAM) 50 MG tablet 1 tablet as needed     No current facility-administered medications on file prior to visit.   Allergies  Allergen Reactions   Sulfa Antibiotics Nausea And Vomiting   Wasp Venom Other (See Comments)   Wasp Venom Protein Other (See Comments)   Family History  Problem Relation Age of Onset   Diabetes Mother        Type II   Heart disease Mother        stent surgery   Hypertension Mother    Hyperlipidemia Mother    Diabetes Father        Type II   Hypertension Father    Hyperlipidemia Father    PE: BP 122/70   Pulse 88   Ht 5\' 5"  (1.651 m)   Wt 287 lb 12.8 oz (130.5 kg)   SpO2 99%   BMI 47.89  kg/m   Wt Readings from Last 3 Encounters:  07/17/23 287 lb 12.8 oz (130.5 kg)  06/12/23 292 lb (132.5 kg)  01/09/23 (!) 303 lb (137.4 kg)   Constitutional: overweight, in NAD Eyes:  EOMI, no exophthalmos ENT: no neck masses, no cervical lymphadenopathy Cardiovascular: RRR, No MRG Respiratory: CTA B Musculoskeletal: no deformities Skin:no rashes Neurological: no tremor with outstretched hands Diabetic Foot Exam - Simple   Simple Foot Form Diabetic Foot exam was performed with the following findings: Yes 07/17/2023  3:20 PM  Visual Inspection No deformities, no ulcerations, no other skin breakdown bilaterally: Yes Sensation Testing Intact to touch and monofilament testing bilaterally: Yes Pulse Check Posterior Tibialis and Dorsalis pulse intact bilaterally: Yes Comments    ASSESSMENT: 1. Hashimoto's thyroiditis  2.  Thyroid nodule  3.  Type 2 diabetes which - FH of DM  PLAN:  1. Patient with history of Hashimoto's thyroiditis, on levothyroxine.  She also continues selenium 200 mcg daily. -She has intermittent pressure in the neck most likely due to Hashimoto's thyroid inflammation - latest thyroid labs reviewed with pt. >> normal: Lab Results  Component Value Date   TSH 1.34 06/06/2022  - she continues on LT4 50 mcg daily - pt feels good on this dose. - we discussed about taking the thyroid hormone every day, with water, >30 minutes before breakfast, separated by >4 hours from acid reflux medications, calcium, iron, multivitamins. Pt. is taking it correctly. - will check thyroid tests today: TSH and fT4 - If labs are abnormal, she will need to return for repeat TFTs in 1.5 months  2.  Thyroid nodule -She previously had neck pressure, but this was deemed to be most likely related to her Hashimoto's thyroiditis rather than the small nodule, measuring 5 mm on the ultrasound from 2014 -In 2023, she again describes dysphagia and pressure in neck.  Another thyroid  ultrasound was checked and this did not show any abnormalities beyond heterogeneity consistent with Hashimoto's thyroiditis. -Will continue selenium which can help decrease thyroid inflammation -We can continue to monitor her expectantly  3.  Type 2 diabetes -Patient with well-controlled type 2 diabetes on SGLT2 inhibitor and GLP-1  receptor agonist, increased at last visit.  At that time, sugars were mostly at goal, improved, but she was interested in increasing Ozempic for diabetes control but also to reduce her appetite and help more with weight loss.  We increased the dose to 2 mg weekly.  We continued Farxiga at the same dose.  HbA1c at that time was 5.7%, lower. -She lost 16 more pounds since last visit  -Sugars appear to be at goal whenever she checks.  She did not check in the last 2 weeks with her mom being in the hospital.  However, HbA1c obtained today stable, excellent.  Need to change her diabetic regimen.  She is now more active and is able to walk 1 mile at a time without problems. - I advised her to: Patient Instructions  Please continue: - Farxiga 5 mg before b'fast - Ozempic 2 mg weekly  Continue Levothyroxine 50 mcg daily.  Take the thyroid hormone every day, with water, at least 30 minutes before breakfast, separated by at least 4 hours from: - acid reflux medications - calcium - iron - multivitamins  Please stop at the lab.  You should have an endocrinology follow-up appointment in 6 months.  - we checked her HbA1c: 5.7% (stable) - advised to check sugars at different times of the day - 1x a day, rotating check times - advised for yearly eye exams >> she is due - will check annual labs today - return to clinic in 6 months  Needs levothyroxine refills.  Carlus Pavlov, MD PhD [facility] Endocrinology

## 2023-07-18 LAB — COMPREHENSIVE METABOLIC PANEL
AG Ratio: 1.3 (calc) (ref 1.0–2.5)
ALT: 50 U/L — ABNORMAL HIGH (ref 6–29)
AST: 31 U/L (ref 10–35)
Albumin: 3.9 g/dL (ref 3.6–5.1)
Alkaline phosphatase (APISO): 72 U/L (ref 31–125)
BUN: 17 mg/dL (ref 7–25)
CO2: 27 mmol/L (ref 20–32)
Calcium: 9.4 mg/dL (ref 8.6–10.2)
Chloride: 101 mmol/L (ref 98–110)
Creat: 0.96 mg/dL (ref 0.50–0.99)
Globulin: 3.1 g/dL (ref 1.9–3.7)
Glucose, Bld: 83 mg/dL (ref 65–99)
Potassium: 4.2 mmol/L (ref 3.5–5.3)
Sodium: 139 mmol/L (ref 135–146)
Total Bilirubin: 0.8 mg/dL (ref 0.2–1.2)
Total Protein: 7 g/dL (ref 6.1–8.1)

## 2023-07-18 LAB — MICROALBUMIN / CREATININE URINE RATIO
Creatinine, Urine: 256 mg/dL (ref 20–275)
Microalb Creat Ratio: 2 mg/g{creat} (ref <30)
Microalb, Ur: 0.6 mg/dL

## 2023-07-18 LAB — LIPID PANEL
Cholesterol: 157 mg/dL (ref <200)
HDL: 49 mg/dL — ABNORMAL LOW (ref >=50)
LDL Cholesterol (Calc): 89 mg/dL
Non-HDL Cholesterol (Calc): 108 mg/dL (ref <130)
Total CHOL/HDL Ratio: 3.2 (calc) (ref <5.0)
Triglycerides: 97 mg/dL (ref <150)

## 2023-07-18 LAB — TSH: TSH: 0.69 m[IU]/L

## 2023-07-18 LAB — T4, FREE: Free T4: 1.2 ng/dL (ref 0.8–1.8)

## 2023-08-07 ENCOUNTER — Ambulatory Visit (INDEPENDENT_AMBULATORY_CARE_PROVIDER_SITE_OTHER): Payer: Medicaid Other

## 2023-08-07 DIAGNOSIS — I5022 Chronic systolic (congestive) heart failure: Secondary | ICD-10-CM | POA: Diagnosis not present

## 2023-08-07 DIAGNOSIS — I428 Other cardiomyopathies: Secondary | ICD-10-CM

## 2023-08-08 LAB — CUP PACEART REMOTE DEVICE CHECK
Battery Remaining Longevity: 42 mo
Battery Remaining Percentage: 45 %
Battery Voltage: 2.95 V
Brady Statistic AP VP Percent: 2.9 %
Brady Statistic AP VS Percent: 1 %
Brady Statistic AS VP Percent: 96 %
Brady Statistic AS VS Percent: 1 %
Brady Statistic RA Percent Paced: 3.3 %
Date Time Interrogation Session: 20241218015016
HighPow Impedance: 83 Ohm
HighPow Impedance: 83 Ohm
Implantable Lead Connection Status: 753985
Implantable Lead Connection Status: 753985
Implantable Lead Connection Status: 753985
Implantable Lead Implant Date: 20200306
Implantable Lead Implant Date: 20200306
Implantable Lead Implant Date: 20200306
Implantable Lead Location: 753858
Implantable Lead Location: 753859
Implantable Lead Location: 753860
Implantable Lead Model: 7122
Implantable Pulse Generator Implant Date: 20200306
Lead Channel Impedance Value: 1150 Ohm
Lead Channel Impedance Value: 480 Ohm
Lead Channel Impedance Value: 560 Ohm
Lead Channel Pacing Threshold Amplitude: 0.5 V
Lead Channel Pacing Threshold Amplitude: 0.625 V
Lead Channel Pacing Threshold Amplitude: 1.125 V
Lead Channel Pacing Threshold Pulse Width: 0.5 ms
Lead Channel Pacing Threshold Pulse Width: 0.5 ms
Lead Channel Pacing Threshold Pulse Width: 0.5 ms
Lead Channel Sensing Intrinsic Amplitude: 11.9 mV
Lead Channel Sensing Intrinsic Amplitude: 5 mV
Lead Channel Setting Pacing Amplitude: 1.625
Lead Channel Setting Pacing Amplitude: 2 V
Lead Channel Setting Pacing Amplitude: 2.125
Lead Channel Setting Pacing Pulse Width: 0.5 ms
Lead Channel Setting Pacing Pulse Width: 0.5 ms
Lead Channel Setting Sensing Sensitivity: 0.5 mV
Pulse Gen Serial Number: 9878925
Zone Setting Status: 755011

## 2023-08-30 ENCOUNTER — Other Ambulatory Visit: Payer: Self-pay | Admitting: Cardiology

## 2023-08-30 DIAGNOSIS — I5022 Chronic systolic (congestive) heart failure: Secondary | ICD-10-CM

## 2023-09-14 NOTE — Addendum Note (Signed)
Addended by: Geralyn Flash D on: 09/14/2023 12:55 PM   Modules accepted: Orders

## 2023-09-14 NOTE — Progress Notes (Signed)
Remote ICD transmission.

## 2023-09-19 ENCOUNTER — Ambulatory Visit: Payer: Medicaid Other | Admitting: Podiatry

## 2023-09-19 ENCOUNTER — Ambulatory Visit (INDEPENDENT_AMBULATORY_CARE_PROVIDER_SITE_OTHER): Payer: Medicaid Other

## 2023-09-19 ENCOUNTER — Encounter: Payer: Self-pay | Admitting: Podiatry

## 2023-09-19 DIAGNOSIS — M7752 Other enthesopathy of left foot: Secondary | ICD-10-CM

## 2023-09-19 DIAGNOSIS — M79671 Pain in right foot: Secondary | ICD-10-CM

## 2023-09-19 DIAGNOSIS — M79672 Pain in left foot: Secondary | ICD-10-CM | POA: Diagnosis not present

## 2023-09-19 DIAGNOSIS — M7751 Other enthesopathy of right foot: Secondary | ICD-10-CM | POA: Diagnosis not present

## 2023-09-19 DIAGNOSIS — M779 Enthesopathy, unspecified: Secondary | ICD-10-CM | POA: Diagnosis not present

## 2023-09-19 MED ORDER — TRIAMCINOLONE ACETONIDE 10 MG/ML IJ SUSP
10.0000 mg | Freq: Once | INTRAMUSCULAR | Status: AC
  Administered 2023-09-19: 10 mg via INTRA_ARTICULAR

## 2023-09-19 NOTE — Progress Notes (Signed)
Subjective:   Patient ID: Sarah English, female   DOB: 48 y.o.   MRN: [id number]   HPI Pain patient states she is developed a lot of pain on top of both of her feet and the right heel has started to become symptomatic again recently   ROS      Objective:  Physical Exam  Neuro vascular status intact inflammation of the dorsal tendon complex of both feet and the plantar heel right and moderate indications of arthritis     Assessment:  Acute tendinitis bilateral mild to moderate plantar fasciitis right     Plan:  H&P reviewed sterile prep injected the dorsal extensor complex bilateral after explaining risk 3 mg Dexasone Kenalog 5 mg Xylocaine and did a careful small injection plantar heel right with 3 mg Kenalog 5 mg Xylocaine.  Patient will be seen back as needed hopefully this will solve symptoms  X-rays indicate spur of the plantar heel and does indicate arthritis midtarsal joint bilateral

## 2023-09-24 ENCOUNTER — Other Ambulatory Visit: Payer: Self-pay | Admitting: Internal Medicine

## 2023-09-27 ENCOUNTER — Other Ambulatory Visit: Payer: Self-pay | Admitting: Cardiology

## 2023-10-03 ENCOUNTER — Ambulatory Visit (INDEPENDENT_AMBULATORY_CARE_PROVIDER_SITE_OTHER): Payer: Medicaid Other | Admitting: Podiatry

## 2023-10-03 ENCOUNTER — Encounter: Payer: Self-pay | Admitting: Podiatry

## 2023-10-03 DIAGNOSIS — M779 Enthesopathy, unspecified: Secondary | ICD-10-CM

## 2023-10-03 DIAGNOSIS — M722 Plantar fascial fibromatosis: Secondary | ICD-10-CM | POA: Diagnosis not present

## 2023-10-03 MED ORDER — TRIAMCINOLONE ACETONIDE 10 MG/ML IJ SUSP
10.0000 mg | Freq: Once | INTRAMUSCULAR | Status: AC
  Administered 2023-10-03: 10 mg via INTRA_ARTICULAR

## 2023-10-03 NOTE — Progress Notes (Signed)
Subjective:   Patient ID: Sarah English, female   DOB: 48 y.o.   MRN: [id number]   HPI Patient states she is improving but still having pain in the right heel left top of the foot   ROS      Objective:  Physical Exam  Neurovascular status intact with inflammation pain of the plantar heel right with pain in the dorsal tendon complex left     Assessment:  Acute plantar fasciitis right and tendinitis left     Plan:  H&P reviewed sterile prep injected the plantar fascia right 3 mg Kenalog 5 mg Xylocaine in the dorsal tendon complex left 3 mg Dexasone Kenalog 5 mg Xylocaine applied sterile dressings reappoint to recheck

## 2023-10-11 ENCOUNTER — Other Ambulatory Visit: Payer: Self-pay | Admitting: Cardiology

## 2023-10-11 DIAGNOSIS — I5022 Chronic systolic (congestive) heart failure: Secondary | ICD-10-CM

## 2023-10-11 DIAGNOSIS — I428 Other cardiomyopathies: Secondary | ICD-10-CM

## 2023-11-06 ENCOUNTER — Ambulatory Visit (INDEPENDENT_AMBULATORY_CARE_PROVIDER_SITE_OTHER): Payer: Medicaid Other

## 2023-11-06 DIAGNOSIS — I428 Other cardiomyopathies: Secondary | ICD-10-CM | POA: Diagnosis not present

## 2023-11-07 LAB — CUP PACEART REMOTE DEVICE CHECK
Battery Remaining Longevity: 40 mo
Battery Remaining Percentage: 42 %
Battery Voltage: 2.93 V
Brady Statistic AP VP Percent: 3.1 %
Brady Statistic AP VS Percent: 1 %
Brady Statistic AS VP Percent: 96 %
Brady Statistic AS VS Percent: 1 %
Brady Statistic RA Percent Paced: 3.5 %
Date Time Interrogation Session: 20250318020033
HighPow Impedance: 83 Ohm
HighPow Impedance: 83 Ohm
Implantable Lead Connection Status: 753985
Implantable Lead Connection Status: 753985
Implantable Lead Connection Status: 753985
Implantable Lead Implant Date: 20200306
Implantable Lead Implant Date: 20200306
Implantable Lead Implant Date: 20200306
Implantable Lead Location: 753858
Implantable Lead Location: 753859
Implantable Lead Location: 753860
Implantable Lead Model: 7122
Implantable Pulse Generator Implant Date: 20200306
Lead Channel Impedance Value: 1100 Ohm
Lead Channel Impedance Value: 480 Ohm
Lead Channel Impedance Value: 550 Ohm
Lead Channel Pacing Threshold Amplitude: 0.5 V
Lead Channel Pacing Threshold Amplitude: 0.625 V
Lead Channel Pacing Threshold Amplitude: 1.375 V
Lead Channel Pacing Threshold Pulse Width: 0.5 ms
Lead Channel Pacing Threshold Pulse Width: 0.5 ms
Lead Channel Pacing Threshold Pulse Width: 0.5 ms
Lead Channel Sensing Intrinsic Amplitude: 12 mV
Lead Channel Sensing Intrinsic Amplitude: 5 mV
Lead Channel Setting Pacing Amplitude: 1.625
Lead Channel Setting Pacing Amplitude: 2 V
Lead Channel Setting Pacing Amplitude: 2.375
Lead Channel Setting Pacing Pulse Width: 0.5 ms
Lead Channel Setting Pacing Pulse Width: 0.5 ms
Lead Channel Setting Sensing Sensitivity: 0.5 mV
Pulse Gen Serial Number: 9878925
Zone Setting Status: 755011

## 2023-11-11 ENCOUNTER — Other Ambulatory Visit: Payer: Self-pay | Admitting: Cardiology

## 2023-11-11 ENCOUNTER — Other Ambulatory Visit: Payer: Self-pay | Admitting: Neurology

## 2023-11-12 ENCOUNTER — Encounter: Payer: Self-pay | Admitting: Internal Medicine

## 2023-11-14 ENCOUNTER — Encounter: Payer: Self-pay | Admitting: Cardiology

## 2023-11-14 ENCOUNTER — Ambulatory Visit: Payer: Medicaid Other | Attending: Cardiology | Admitting: Cardiology

## 2023-11-14 VITALS — BP 119/77 | HR 69 | Resp 16 | Ht 65.0 in | Wt 272.8 lb

## 2023-11-14 DIAGNOSIS — I428 Other cardiomyopathies: Secondary | ICD-10-CM

## 2023-11-14 NOTE — Patient Instructions (Signed)
  Follow-Up: At [facility], you and your health needs are our priority.  As part of our continuing mission to provide you with exceptional heart care, we have created designated Provider Care Teams.  These Care Teams include your primary Cardiologist (physician) and Advanced Practice Providers (APPs -  Physician Assistants and Nurse Practitioners) who all work together to provide you with the care you need, when you need it.   Your next appointment:   1 year(s)  Provider:   Elder Negus, MD     Other Instructions   1st Floor: - Lobby - Registration  - Pharmacy  - Lab - Cafe  2nd Floor: - PV Lab - Diagnostic Testing (echo, CT, nuclear med)  3rd Floor: - Vacant  4th Floor: - TCTS (cardiothoracic surgery) - AFib Clinic - Structural Heart Clinic - Vascular Surgery  - Vascular Ultrasound  5th Floor: - [facility] Cardiology (general and EP) - Clinical Pharmacy for coumadin, hypertension, lipid, weight-loss medications, and med management appointments    Valet parking services will be available as well.

## 2023-11-14 NOTE — Progress Notes (Signed)
  Cardiology Office Note:  .   Date:  11/14/2023  ID:  Sarah English, DOB 1975/10/12, MRN [id number] PCP: Lonie Peak, PA-C  [facility] Providers Cardiologist:  Truett Mainland, MD PCP: Lonie Peak, PA-C  Chief Complaint  Patient presents with   Nonischemic cardiomyopathy    Follow-up     Sarah English is a 48 y.o. female with nonischemic dilated cardiomyopathy, now s/p BiVICD placement, Hashimoto thyroiditis, systemic sclerosis, Sjogren's disease, Raynaud's syndrome, morbid obesity   Discussed the use of AI scribe software for clinical note transcription with the patient, who gave verbal consent to proceed.  History of Present Illness       Vitals:   11/14/23 1523  Pulse: 69  Resp: 16  SpO2: 95%      Review of Systems  Cardiovascular:  Positive for dyspnea on exertion (Mild and occasional). Negative for chest pain, leg swelling, palpitations and syncope.        Studies Reviewed: Marland Kitchen        EKG 11/14/2023: Atrial-sensed ventricular-paced rhythm Biventricular pacemaker detected When compared with ECG of 26-Oct-2018 04:57, No significant change was found    Independently interpreted 06/2023: Chol 157, TG 97, HDL 49, LDL 89 HbA1C 5.7% Hb 13.6 Cr 0.96, ALT 50   Echocardiogram 03/23/2021:  Left ventricle cavity is normal in size and wall thickness. Normal global  wall motion. Normal LV systolic function with EF 62%. Normal diastolic  filling pattern.  Structurally normal trileaflet aortic valve with trace aortic stenosis. No  regurgitation.  Compared to previous study on 02/03/2020, LVED has further improved from  45-50%. Trace aortic stenosis is new.    Physical Exam Vitals and nursing note reviewed.  Constitutional:      General: She is not in acute distress. Neck:     Vascular: No JVD.  Cardiovascular:     Rate and Rhythm: Normal rate and regular rhythm.     Heart sounds: Normal heart sounds. No murmur heard. Pulmonary:     Effort:  Pulmonary effort is normal.     Breath sounds: Normal breath sounds. No wheezing or rales.  Musculoskeletal:     Right lower leg: No edema.     Left lower leg: No edema.      VISIT DIAGNOSES:   ICD-10-CM   1. Nonischemic cardiomyopathy (HCC)  I42.8 EKG 12-Lead       Sarah English is a 48 y.o. female with nonischemic dilated cardiomyopathy, now s/p BiVICD placement, Hashimoto thyroiditis, systemic sclerosis, Sjogren's disease, Raynaud's syndrome, morbid obesity  Assessment & Plan:   Chronic systolic heart failure (HCC): LVEF recovered to 62% and 2022. Currently on Entresto 97-103 mg bid, metoprolol succinate to 50 mg daily,  spironolactone to 50 mg daily, corlanor 7.5 mg bid, Farxiga 5 mg daily, lasix 20 mg prn Medications refilled today. Upcoming echocardiogram in 12/2023.   F/u in 1 year  Signed, Elder Negus, MD

## 2023-12-15 ENCOUNTER — Other Ambulatory Visit: Payer: Self-pay | Admitting: Internal Medicine

## 2023-12-21 ENCOUNTER — Ambulatory Visit (HOSPITAL_COMMUNITY): Payer: Medicaid Other | Attending: Cardiology

## 2023-12-21 ENCOUNTER — Other Ambulatory Visit: Payer: Medicaid Other

## 2023-12-21 ENCOUNTER — Other Ambulatory Visit: Payer: Self-pay | Admitting: Internal Medicine

## 2023-12-21 DIAGNOSIS — I428 Other cardiomyopathies: Secondary | ICD-10-CM | POA: Insufficient documentation

## 2023-12-21 LAB — ECHOCARDIOGRAM COMPLETE
Area-P 1/2: 2.25 cm2
S' Lateral: 3.15 cm

## 2023-12-24 NOTE — Progress Notes (Signed)
 Remote ICD transmission.

## 2023-12-24 NOTE — Addendum Note (Signed)
 Addended by: Lott Rouleau A on: 12/24/2023 10:13 AM   Modules accepted: Orders

## 2024-01-02 ENCOUNTER — Ambulatory Visit: Payer: Self-pay | Admitting: Cardiology

## 2024-01-05 ENCOUNTER — Other Ambulatory Visit: Payer: Self-pay | Admitting: Cardiology

## 2024-01-05 DIAGNOSIS — I428 Other cardiomyopathies: Secondary | ICD-10-CM

## 2024-01-05 DIAGNOSIS — I5022 Chronic systolic (congestive) heart failure: Secondary | ICD-10-CM

## 2024-01-12 ENCOUNTER — Other Ambulatory Visit: Payer: Self-pay | Admitting: Cardiology

## 2024-01-16 ENCOUNTER — Other Ambulatory Visit: Payer: Self-pay | Admitting: Physician Assistant

## 2024-01-16 DIAGNOSIS — Z1231 Encounter for screening mammogram for malignant neoplasm of breast: Secondary | ICD-10-CM

## 2024-01-22 ENCOUNTER — Ambulatory Visit
Admission: RE | Admit: 2024-01-22 | Discharge: 2024-01-22 | Disposition: A | Discharge status: Home / Self Care | Source: Ambulatory Visit | Attending: Physician Assistant | Admitting: Physician Assistant

## 2024-01-22 DIAGNOSIS — Z1231 Encounter for screening mammogram for malignant neoplasm of breast: Secondary | ICD-10-CM

## 2024-01-24 ENCOUNTER — Ambulatory Visit: Payer: Medicaid Other | Admitting: Internal Medicine

## 2024-01-29 ENCOUNTER — Other Ambulatory Visit: Payer: Self-pay | Admitting: Physician Assistant

## 2024-01-29 DIAGNOSIS — R928 Other abnormal and inconclusive findings on diagnostic imaging of breast: Secondary | ICD-10-CM

## 2024-02-05 ENCOUNTER — Encounter

## 2024-02-05 ENCOUNTER — Ambulatory Visit (INDEPENDENT_AMBULATORY_CARE_PROVIDER_SITE_OTHER): Payer: Medicaid Other

## 2024-02-05 ENCOUNTER — Other Ambulatory Visit

## 2024-02-05 DIAGNOSIS — I5022 Chronic systolic (congestive) heart failure: Secondary | ICD-10-CM | POA: Diagnosis not present

## 2024-02-05 LAB — CUP PACEART REMOTE DEVICE CHECK
Battery Remaining Longevity: 37 mo
Battery Remaining Percentage: 39 %
Battery Voltage: 2.93 V
Brady Statistic AP VP Percent: 3.2 %
Brady Statistic AP VS Percent: 1 %
Brady Statistic AS VP Percent: 96 %
Brady Statistic AS VS Percent: 1 %
Brady Statistic RA Percent Paced: 3.6 %
Date Time Interrogation Session: 20250617040018
HighPow Impedance: 77 Ohm
HighPow Impedance: 77 Ohm
Implantable Lead Connection Status: 753985
Implantable Lead Connection Status: 753985
Implantable Lead Connection Status: 753985
Implantable Lead Implant Date: 20200306
Implantable Lead Implant Date: 20200306
Implantable Lead Implant Date: 20200306
Implantable Lead Location: 753858
Implantable Lead Location: 753859
Implantable Lead Location: 753860
Implantable Lead Model: 7122
Implantable Pulse Generator Implant Date: 20200306
Lead Channel Impedance Value: 1100 Ohm
Lead Channel Impedance Value: 480 Ohm
Lead Channel Impedance Value: 540 Ohm
Lead Channel Pacing Threshold Amplitude: 0.5 V
Lead Channel Pacing Threshold Amplitude: 1.25 V
Lead Channel Pacing Threshold Amplitude: 1.375 V
Lead Channel Pacing Threshold Pulse Width: 0.5 ms
Lead Channel Pacing Threshold Pulse Width: 0.5 ms
Lead Channel Pacing Threshold Pulse Width: 0.5 ms
Lead Channel Sensing Intrinsic Amplitude: 11.9 mV
Lead Channel Sensing Intrinsic Amplitude: 5 mV
Lead Channel Setting Pacing Amplitude: 2 V
Lead Channel Setting Pacing Amplitude: 2.25 V
Lead Channel Setting Pacing Amplitude: 2.375
Lead Channel Setting Pacing Pulse Width: 0.5 ms
Lead Channel Setting Pacing Pulse Width: 0.5 ms
Lead Channel Setting Sensing Sensitivity: 0.5 mV
Pulse Gen Serial Number: 9878925
Zone Setting Status: 755011

## 2024-02-07 ENCOUNTER — Ambulatory Visit: Payer: Self-pay | Admitting: Internal Medicine

## 2024-02-07 ENCOUNTER — Ambulatory Visit: Admission: RE | Admit: 2024-02-07 | Source: Ambulatory Visit

## 2024-02-07 ENCOUNTER — Ambulatory Visit
Admission: RE | Admit: 2024-02-07 | Discharge: 2024-02-07 | Disposition: A | Discharge status: Home / Self Care | Source: Ambulatory Visit | Attending: Physician Assistant | Admitting: Physician Assistant

## 2024-02-07 DIAGNOSIS — R928 Other abnormal and inconclusive findings on diagnostic imaging of breast: Secondary | ICD-10-CM

## 2024-02-11 ENCOUNTER — Other Ambulatory Visit: Payer: Self-pay | Admitting: Cardiology

## 2024-02-13 ENCOUNTER — Other Ambulatory Visit: Payer: Self-pay | Admitting: Cardiology

## 2024-02-13 DIAGNOSIS — I5022 Chronic systolic (congestive) heart failure: Secondary | ICD-10-CM

## 2024-03-19 ENCOUNTER — Other Ambulatory Visit: Payer: Self-pay | Admitting: Internal Medicine

## 2024-03-27 ENCOUNTER — Ambulatory Visit: Admitting: Internal Medicine

## 2024-04-02 ENCOUNTER — Ambulatory Visit: Admitting: Internal Medicine

## 2024-04-03 ENCOUNTER — Other Ambulatory Visit: Payer: Self-pay | Admitting: Cardiology

## 2024-04-15 ENCOUNTER — Other Ambulatory Visit: Payer: Self-pay | Admitting: Internal Medicine

## 2024-04-17 NOTE — Progress Notes (Signed)
 Remote ICD transmission.

## 2024-05-01 ENCOUNTER — Other Ambulatory Visit: Payer: Self-pay

## 2024-05-01 DIAGNOSIS — I5022 Chronic systolic (congestive) heart failure: Secondary | ICD-10-CM

## 2024-05-02 ENCOUNTER — Ambulatory Visit: Admitting: Internal Medicine

## 2024-05-02 MED ORDER — SPIRONOLACTONE 50 MG PO TABS: 50.0000 mg | ORAL_TABLET | Freq: Every day | ORAL | 2 refills | Status: AC

## 2024-05-06 ENCOUNTER — Ambulatory Visit (INDEPENDENT_AMBULATORY_CARE_PROVIDER_SITE_OTHER): Payer: Medicaid Other

## 2024-05-06 DIAGNOSIS — I5022 Chronic systolic (congestive) heart failure: Secondary | ICD-10-CM

## 2024-05-07 LAB — CUP PACEART REMOTE DEVICE CHECK
Battery Remaining Longevity: 35 mo
Battery Remaining Percentage: 37 %
Battery Voltage: 2.93 V
Brady Statistic AP VP Percent: 3.6 %
Brady Statistic AP VS Percent: 1 %
Brady Statistic AS VP Percent: 96 %
Brady Statistic AS VS Percent: 1 %
Brady Statistic RA Percent Paced: 4.1 %
Date Time Interrogation Session: 20250916043055
HighPow Impedance: 83 Ohm
HighPow Impedance: 83 Ohm
Implantable Lead Connection Status: 753985
Implantable Lead Connection Status: 753985
Implantable Lead Connection Status: 753985
Implantable Lead Implant Date: 20200306
Implantable Lead Implant Date: 20200306
Implantable Lead Implant Date: 20200306
Implantable Lead Location: 753858
Implantable Lead Location: 753859
Implantable Lead Location: 753860
Implantable Lead Model: 7122
Implantable Pulse Generator Implant Date: 20200306
Lead Channel Impedance Value: 1100 Ohm
Lead Channel Impedance Value: 480 Ohm
Lead Channel Impedance Value: 550 Ohm
Lead Channel Pacing Threshold Amplitude: 0.5 V
Lead Channel Pacing Threshold Amplitude: 0.5 V
Lead Channel Pacing Threshold Amplitude: 1.25 V
Lead Channel Pacing Threshold Pulse Width: 0.5 ms
Lead Channel Pacing Threshold Pulse Width: 0.5 ms
Lead Channel Pacing Threshold Pulse Width: 0.5 ms
Lead Channel Sensing Intrinsic Amplitude: 12 mV
Lead Channel Sensing Intrinsic Amplitude: 5 mV
Lead Channel Setting Pacing Amplitude: 1.5 V
Lead Channel Setting Pacing Amplitude: 2 V
Lead Channel Setting Pacing Amplitude: 2.25 V
Lead Channel Setting Pacing Pulse Width: 0.5 ms
Lead Channel Setting Pacing Pulse Width: 0.5 ms
Lead Channel Setting Sensing Sensitivity: 0.5 mV
Pulse Gen Serial Number: 9878925
Zone Setting Status: 755011

## 2024-05-09 ENCOUNTER — Ambulatory Visit: Payer: Self-pay | Admitting: Internal Medicine

## 2024-05-15 ENCOUNTER — Ambulatory Visit: Admitting: Internal Medicine

## 2024-05-15 NOTE — Progress Notes (Deleted)
 Patient ID: Sarah English, female   DOB: Dec 24, 1975, 48 y.o.   MRN: [id number]  HPI  Sarah English is a 48 y.o.-year-old female, returning for f/u for Hashimoto's thyroiditis, thyroid  nodule, DM2, dx'ed 08/2021, non-insulin-dependent.  Last visit 9 months ago.  Interim history: She has increased urination, but no nausea, chest pain, blurry vision. She lost 13 more pounds before last visit after increasing Ozempic .  She continues to walk for exercise. Her mother just had open heart surgery before our last visit.  She is doing well.  Hashimoto's hypothyroidism: Reviewed and addended history: Pt. has been dx with thyroiditis based on the aspect of the thyroid  at the time of a thyroid  U/S: heterogenous aspect, c/w thyroiditis. Afterwards, antithyroid antibodies also returned elevated.  In the past, she was taking Tri-iodine and Tyrosine.  We stopped these.  As the TSH was slightly high in 07/2014, we started her on levothyroxine , but subsequent TSH is slightly suppressed.  We had to stop levothyroxine  then.  She has been off levothyroxine  for several years until 12/2021, when we restarted LT4 50 mcg daily.  She did not return for labs after restarting this.  We started selenium  200 mcg daily in 2019 - feels better on this.    Pt takes levothyroxine  50 mcg daily: - in am - fasting - at least 30 min from b'fast - no calcium - no iron - no multivitamins - no PPIs - not on Biotin  Reviewed her TFTs: 01/15/2024: TSH 0.589 Lab Results  Component Value Date   TSH 0.69 07/17/2023   TSH 1.34 06/06/2022   TSH 8.55 (H) 12/29/2021   TSH 3.58 06/06/2021   TSH 2.52 01/14/2020   FREET4 1.2 07/17/2023   FREET4 0.89 06/06/2022   FREET4 0.62 12/29/2021   FREET4 1.17 06/06/2021   FREET4 0.69 01/14/2020   Lab Results  Component Value Date   T3FREE 3.1 12/29/2021   T3FREE 5.9 (H) 06/06/2021   T3FREE 3.1 01/14/2020   T3FREE 3.2 12/27/2018   T3FREE 3.5 01/28/2018   T3FREE 3.2 07/30/2014    T3FREE 2.3 03/26/2014   T3FREE 2.8 02/10/2014   T3FREE 3.1 11/11/2013   Her TPO antibodies were elevated: Component     Latest Ref Rng & Units 01/14/2020  Thyroperoxidase Ab SerPl-aCnc     <9 IU/mL 13 (H)   Component     Latest Ref Rng & Units 11/11/2013 01/28/2018 12/27/2018  Thyroperoxidase Ab SerPl-aCnc     <9 IU/mL 74.0 (H) 26 (H) 22 (H)   Thyroid  nodule:  Pt denies: - hoarseness  - choking  Tyroid ultrasound report (04/15/2013): Right thyroid  lobe:  4.5 x 2.1 x 1.6 cm Left thyroid  lobe:  4.5 x 1.6 x 2.1 cm Isthmus:  6 mm   Focal nodules:  Thyroid  echotexture is diffusely heterogeneous. Thyroid  morphology is diffusely lobular.   5 mm hyperechoic focus in the interpolar left lobe on image 24.   Otherwise, no measurable focal solid or cystic nodules seen.   Diffusely increased vascularity.   Lymphadenopathy:  None visualized.   IMPRESSION: Lobular thyroid  diffusely with heterogeneous echotexture and suggestion of increased vascularity.  Appearance is nonspecific and could be related to thyroiditis or its sequela.  Thyroid  U/S (09/30/2021): Parenchymal Echotexture: Mildly heterogenous Isthmus: 7 mm  Right lobe: 4.1 x 1.9 x 1.0 cm  Left lobe: 3.8 x 1.5 x 1.8 cm _________________________________________________________   Nonspecific diffuse mild thyroid  heterogeneity without hypervascularity, discrete mass, or nodule. No regional adenopathy.   IMPRESSION: Mild thyroid  heterogeneity compatible  with medical thyroid  disease. Negative for nodule.  No FH of thyroid  cancer. No h/o radiation tx to head or neck. No Biotin use. No recent steroids use.   DM2: - dx 08/2021 - Previously with increased thirst and increased urination.   - She has a family history of diabetes in mother, father, grandmother.    She is on: - Farxiga  5 mg before b'fast - started 2022 by cardiology - Ozempic  0.25 >> 0.5 >> 1 >> 2 mg weekly (last dose increase 12/2022) We had to stop metformin  due  to diarrhea even with the lowest dose of metformin  ER.  Reviewed HbA1c levels: Lab Results  Component Value Date   HGBA1C 5.7 (A) 07/17/2023   HGBA1C 5.7 (A) 01/09/2023   HGBA1C 5.8 (A) 06/06/2022   HGBA1C 7.1 (A) 09/16/2021   HGBA1C 7.3 (H) 06/06/2021  12/29/2021: HbA1c 6.9% 12/13/2021: HbA1c 6.6%  After she came off her CGM, she is not checking her CBGs consistently: - am: 120s-160, 298 >> 102-118, 131 >> 89-99 - 2h after b'fast: 150s  >> N/c - lunch: 150-180 >> 94-113 >> n/c - 2h after lunch: n/c>> 129-167 >> n/c - dinner: 93, 112-138 >> 100-128 >> 100-110 - 2h after dinner: n/c >> 163, 174 >> n/c - bedtime: cookie + tea >> 128-172 >> n/c Lowest: high 60s Highest: 160  Reviewed BUN/creatinine:  Lab Results  Component Value Date   BUN 17 07/17/2023   Lab Results  Component Value Date   CREATININE 0.96 07/17/2023   Lab Results  Component Value Date   MICRALBCREAT 2 07/17/2023  She is on Entresto .  + HL; latest lipid panel:  Lab Results  Component Value Date   CHOL 157 07/17/2023   HDL 49 (L) 07/17/2023   LDLCALC 89 07/17/2023   TRIG 97 07/17/2023   CHOLHDL 3.2 07/17/2023   Last eye exam: 06/2022 - No DR reportedly.   Last foot exam was 07/17/2023.  She also has MCTD, psoriatic arthritis, Sjogren's syndrome, Raynauds, also vitamin D  deficiency (2000 IU daily)-all managed by her rheumatologist. She had a MRI, then lumbar puncture performed for R leg weakness with falls >> no MS. In 2019, she was diagnosed with LBBB + NICMP (EF reduced, 30%), also pulmonary HTN per review of the results of her  catheterization. She had a pacemaker and a defibrillator placed. She had cardiac rehab.  She is a single mom.  ROS: + See HPI  I reviewed pt's medications, allergies, PMH, social hx, family hx, and changes were documented in the history of present illness. Otherwise, unchanged from my initial visit note.  Past Medical History:  Diagnosis Date   Anemia     Autoimmune disorder    Bronchitis    hx -uses inhaler prn   Chronic kidney disease    kidney infections   Endometriosis    Fibromyalgia    GERD (gastroesophageal reflux disease)    occasional   Hashimoto thyroiditis, fibrous variant    Hashimoto's disease    Headache    migraines   History of kidney stones    Hypertension    IVCD (intraventricular conduction defect)    LBBB (left bundle branch block)    Nonischemic cardiomyopathy (HCC)    per stress test 12-17-2017  ef 14%;  per echo 12-13-2017 ef 21%   Peripheral vascular disease    raynoids syndrome   Pneumonia    only x 1 - 2- 3 months ago   Pulmonary hypertension (HCC)    per cardiac  cath 12-18-2017--- moderate WHO Grp II     Scleroderma (HCC)    Sjogren's syndrome    Sleep apnea    recently dx 04/13/18, does not have CPAP machine yet.   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, NYHA class 3 [facility])    cardiologist-  dr newman patwardhan   Tendinitis    lower back   Thyroiditis    Past Surgical History:  Procedure Laterality Date   BIV ICD INSERTION CRT-D N/A 10/25/2018   Procedure: BIV ICD INSERTION CRT-D;  Surgeon: Waddell Danelle ORN, MD;  Location: [facility] INVASIVE CV LAB;  Service: Cardiovascular;  Laterality: N/A;   CESAREAN SECTION  2004   x 1   CHOLECYSTECTOMY     COLONOSCOPY     COLONOSCOPY WITH PROPOFOL  N/A 07/08/2020   Procedure: COLONOSCOPY WITH PROPOFOL ;  Surgeon: Elicia Claw, MD;  Location: [facility] ENDOSCOPY;  Service: Gastroenterology;  Laterality: N/A;   CYSTOSCOPY     x 2 - bladder as a child   HYSTEROSCOPY WITH D & C N/A 04/16/2018   Procedure: DILATATION AND CURETTAGE /HYSTEROSCOPY;  Surgeon: Horacio Boas, MD;  Location: [facility] ORS;  Service: Gynecology;  Laterality: N/A;   LAPAROSCOPIC LYSIS OF ADHESIONS  04/16/2018   Procedure: LAPAROSCOPIC LYSIS OF ADHESIONS;  Surgeon: Horacio Boas, MD;  Location: [facility] ORS;  Service: Gynecology;;   MANDIBLE FRACTURE SURGERY  1994    upper mandibular   POLYPECTOMY  07/08/2020   Procedure: POLYPECTOMY;  Surgeon: Elicia Claw, MD;  Location: [facility] ENDOS[facility];  Service: Gastroenterology;;   RIGHT/LEFT HEART CATH AND CORONARY ANGIOGRAPHY N/A 12/18/2017   Procedure: RIGHT/LEFT HEART CATH AND CORONARY ANGIOGRAPHY;  Surgeon: Elmira newman PARAS, MD;  Location: [facility] INVASIVE CV LAB;  Service: Cardiovascular;  Laterality: N/A;   UPPER GI ENDOSCOPY     WISDOM TOOTH EXTRACTION     Social History   Socioeconomic History   Marital status: Single    Spouse name: Not on file   Number of children: 1   Years of education: 14   Highest education level: Associate degree: occupational, Scientist, product/process development, or vocational program  Occupational History   Occupation: not working  Tobacco Use   Smoking status: Former    Current packs/day: 0.00    Average packs/day: 0.5 packs/day for 8.0 years (4.0 ttl pk-yrs)    Types: Cigarettes    Start date: 12/10/2004    Quit date: 12/10/2012    Years since quitting: 11.4   Smokeless tobacco: Never  Vaping Use   Vaping status: Never Used  Substance and Sexual Activity   Alcohol use: Yes    Comment: once a year   Drug use: No   Sexual activity: Yes    Birth control/protection: Pill  Other Topics Concern   Not on file  Social History Narrative   Lives with son, renting a room from her mother.  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     Education: college.    Right handed   1 coffee/tea  a day   Social Drivers of Corporate investment banker Strain: Not on file  Food Insecurity: Not on file  Transportation Needs: Not on file  Physical Activity: Not on file  Stress: Not on file  Social Connections: Not on file  Intimate Partner Violence: Not on file   Current Outpatient Medications on File Prior to Visit  Medication Sig Dispense Refill   albuterol (PROVENTIL HFA;VENTOLIN HFA) 108 (  90 Base) MCG/ACT inhaler Inhale 1 puff into the lungs every 4 (four) hours  as needed for wheezing or shortness of breath.      cetirizine (ZYRTEC) 10 MG tablet Take 10 mg by mouth daily.     cyclobenzaprine  (FLEXERIL ) 10 MG tablet Take 10 mg by mouth. As needed     cycloSPORINE  (RESTASIS ) 0.05 % ophthalmic emulsion Place 1 drop into both eyes 2 (two) times daily.     dapagliflozin  propanediol (FARXIGA ) 5 MG TABS tablet TAKE 1 TABLET (5 MG TOTAL) BY MOUTH DAILY. 90 tablet 3   furosemide  (LASIX ) 20 MG tablet TAKE 1 TABLET BY MOUTH EVERY DAY AS NEEDED 90 tablet 2   gabapentin  (NEURONTIN ) 300 MG capsule Take 2 capsules (600 mg total) by mouth 2 (two) times daily. 360 capsule 3   ivabradine  (CORLANOR ) 7.5 MG TABS tablet TAKE 1 TABLET (7.5 MG TOTAL) BY MOUTH 2 (TWO) TIMES DAILY WITH A MEAL. 180 tablet 2   levothyroxine  (SYNTHROID ) 50 MCG tablet TAKE 1 TABLET BY MOUTH EVERY DAY BEFORE BREAKFAST 90 tablet 0   meclizine  (ANTIVERT ) 12.5 MG tablet TAKE 1 TABLET (12.5 MG TOTAL) BY MOUTH 2 (TWO) TIMES DAILY AS NEEDED FOR DIZZINESS. 30 tablet 1   metoprolol  succinate (TOPROL -XL) 50 MG 24 hr tablet TAKE 1 TABLET BY MOUTH EVERY DAY WITH OR IMMEDIATELY FOLLOWING A MEAL 90 tablet 2   montelukast (SINGULAIR) 10 MG tablet Take 1 tablet by mouth daily.     ondansetron  (ZOFRAN -ODT) 4 MG disintegrating tablet      OTEZLA 30 MG TABS Take 1 tablet by mouth 2 (two) times daily.     Probiotic Product (PROBIOTIC PO) Take 1 capsule by mouth daily.     Rimegepant Sulfate (NURTEC) 75 MG TBDP Take for severe migraine, limit to twice per week. 8 tablet 5   sacubitril -valsartan  (ENTRESTO ) 97-103 MG TAKE 1 TABLET BY MOUTH TWICE A DAY 180 tablet 3   selenium  (SELENIMIN-200) 200 MCG TABS tablet Take 200 mcg by mouth daily.      Semaglutide , 2 MG/DOSE, (OZEMPIC , 2 MG/DOSE,) 8 MG/3ML SOPN INJECT 2 MG INTO THE SKIN ONCE A WEEK. 9 mL 3   sodium chloride  (OCEAN) 0.65 % SOLN nasal spray Place 1 spray into both nostrils as needed for congestion.     spironolactone  (ALDACTONE ) 50 MG tablet Take 1 tablet (50 mg  total) by mouth daily. 90 tablet 2   traMADol  (ULTRAM ) 50 MG tablet 1 tablet as needed     No current facility-administered medications on file prior to visit.   Allergies  Allergen Reactions   Sulfa Antibiotics Nausea And Vomiting   Wasp Venom Other (See Comments)   Wasp Venom Protein Other (See Comments)   Family History  Problem Relation Age of Onset   Diabetes Mother        Type II   Heart disease Mother        stent surgery   Hypertension Mother    Hyperlipidemia Mother    Diabetes Father        Type II   Hypertension Father    Hyperlipidemia Father    PE: There were no vitals taken for this visit.  Wt Readings from Last 3 Encounters:  11/14/23 272 lb 12.8 oz (123.7 kg)  07/17/23 287 lb 12.8 oz (130.5 kg)  06/12/23 292 lb (132.5 kg)   Constitutional: overweight, in NAD Eyes:  EOMI, no exophthalmos ENT: no neck masses, no cervical lymphadenopathy Cardiovascular: RRR, No MRG Respiratory: CTA B Musculoskeletal:  no deformities Skin:no rashes Neurological: no tremor with outstretched hands  ASSESSMENT: 1. Hashimoto's thyroiditis  2.  Thyroid  nodule  3.  Type 2 diabetes which - FH of DM  PLAN:  1. Patient with history of Hashimoto's thyroiditis, on levothyroxine .  She also continues selenium  200 mcg daily. -She has intermittent pressure in the neck most likely due to Hashimoto's thyroid  inflammation - latest thyroid  labs reviewed with pt. >> normal 4 months ago (see HPI) - she continues on LT4 50 mcg daily - pt feels good on this dose. - we discussed about taking the thyroid  hormone every day, with water, >30 minutes before breakfast, separated by >4 hours from acid reflux medications, calcium, iron, multivitamins. Pt. is taking it correctly.  2.  Thyroid  nodule -She previously had neck pressure, but this was deemed to be most likely related to her Hashimoto's thyroiditis rather than the small nodule, measuring 5 mm on the ultrasound from 2014 -In 2023, she  again describes dysphagia and pressure in neck.  Another thyroid  ultrasound was checked and this did not show any abnormalities beyond heterogeneity consistent with Hashimoto's thyroiditis. -She has no neck compression symptoms at today's visit -Will continue selenium  which can help decrease thyroid  inflammation - We can continue to monitor her expectantly  3.  Type 2 diabetes -Patient with well-controlled type 2 diabetes, on an SGLT2 inhibitor and weekly GLP-1 receptor agonist, with excellent control.  At last visit, HbA1c was stable, at 5.7% and sugars appeared to be at goal whenever she was checking.  I did not recommend a change in her regimen, especially as she was also more active and walking for exercise.  She had lost 16 pounds before last visit.  - I advised her to: Patient Instructions  Please continue: - Farxiga  5 mg before b'fast - Ozempic  2 mg weekly  Continue Levothyroxine  50 mcg daily.  Take the thyroid  hormone every day, with water, at least 30 minutes before breakfast, separated by at least 4 hours from: - acid reflux medications - calcium - iron - multivitamins  Please stop at the lab.  Please return for another appointment in 6 months.  - we checked her HbA1c: 7%  - advised to check sugars at different times of the day - 1x a day, rotating check times - advised for yearly eye exams >> she is UTD - will check an ACR today - return to clinic in 6 months  Lela Fendt, MD PhD [facility] Endocrinology

## 2024-06-16 ENCOUNTER — Encounter: Payer: Self-pay | Admitting: Neurology

## 2024-06-16 ENCOUNTER — Ambulatory Visit: Payer: Medicaid Other | Admitting: Neurology

## 2024-06-18 ENCOUNTER — Other Ambulatory Visit (HOSPITAL_COMMUNITY): Payer: Self-pay

## 2024-06-18 ENCOUNTER — Telehealth: Payer: Self-pay | Admitting: Pharmacy Technician

## 2024-06-18 NOTE — Telephone Encounter (Signed)
 Pharmacy Patient Advocate Encounter   Received notification from CoverMyMeds that prior authorization for NURTEC 75MG  is required/requested.   Insurance verification completed.   The patient is insured through [employer].   Per test claim: PA required; PA submitted to above mentioned insurance via Latent Key/confirmation #/EOC [id number] Status is pending

## 2024-06-24 ENCOUNTER — Other Ambulatory Visit (HOSPITAL_COMMUNITY): Payer: Self-pay

## 2024-06-24 NOTE — Telephone Encounter (Signed)
 Pharmacy Patient Advocate Encounter  Received notification from [employer] that Prior Authorization for NURTEC 75MG  has been APPROVED from 10.31.25 to 10.31.26. Ran test claim, Copay is $4. This test claim was processed through [facility]- copay amounts may vary at other pharmacies due to pharmacy/plan contracts, or as the patient moves through the different stages of their insurance plan.   PA #/Case ID/Reference #: [id number]

## 2024-07-13 ENCOUNTER — Other Ambulatory Visit: Payer: Self-pay | Admitting: Internal Medicine

## 2024-07-14 NOTE — Telephone Encounter (Signed)
 [id number],[id number] for patient to schedule follow up appointment.

## 2024-07-25 ENCOUNTER — Ambulatory Visit: Admitting: Internal Medicine

## 2024-07-28 ENCOUNTER — Ambulatory Visit: Admitting: Internal Medicine

## 2024-07-28 NOTE — Progress Notes (Deleted)
 Patient ID: Sarah English, female   DOB: [birth year], 48 y.o.   MRN: [id number]  HPI  Sarah English is a 48 y.o.-year-old female, returning for f/u for Hashimoto's hypothyroidism, thyroid  nodule, DM2, dx'ed 08/2021, non-insulin-dependent.  Last visit 1 year ago.  Interim history: She has increased urination, but no nausea, chest pain, blurry vision. She lost 15 more pounds since last visit after increasing Ozempic . She continues to walk for exercise.  Hashimoto's hypothyroidism: Reviewed and addended history: Pt. has been dx with thyroiditis based on the aspect of the thyroid  at the time of a thyroid  U/S: heterogenous aspect, c/w thyroiditis. Afterwards, antithyroid antibodies also returned elevated.  In the past, she was taking Tri-iodine and Tyrosine.  We stopped these.  As the TSH was slightly high in 07/2014, we started her on levothyroxine , but subsequent TSH is slightly suppressed.  We had to stop levothyroxine  then.  She has been off levothyroxine  for several years until 12/2021, when we restarted LT4 50 mcg daily.  She did not return for labs after restarting this.  We started selenium  200 mcg daily in 2019 - feels better on this.    Pt takes levothyroxine  50 mcg daily: - in am - fasting - at least 30 min from b'fast - no calcium - no iron - no multivitamins - no PPIs - not on Biotin  Reviewed her TFTs 07/14/2024: TSH 0.648 Lab Results  Component Value Date   TSH 0.69 07/17/2023   TSH 1.34 06/06/2022   TSH 8.55 (H) 12/29/2021   TSH 3.58 06/06/2021   TSH 2.52 01/14/2020   FREET4 1.2 07/17/2023   FREET4 0.89 06/06/2022   FREET4 0.62 12/29/2021   FREET4 1.17 06/06/2021   FREET4 0.69 01/14/2020   Lab Results  Component Value Date   T3FREE 3.1 12/29/2021   T3FREE 5.9 (H) 06/06/2021   T3FREE 3.1 01/14/2020   T3FREE 3.2 12/27/2018   T3FREE 3.5 01/28/2018   T3FREE 3.2 07/30/2014   T3FREE 2.3 03/26/2014   T3FREE 2.8 02/10/2014   T3FREE 3.1 11/11/2013   Her TPO  antibodies were elevated: Component     Latest Ref Rng & Units 01/14/2020  Thyroperoxidase Ab SerPl-aCnc     <9 IU/mL 13 (H)   Component     Latest Ref Rng & Units 11/11/2013 01/28/2018 12/27/2018  Thyroperoxidase Ab SerPl-aCnc     <9 IU/mL 74.0 (H) 26 (H) 22 (H)   Thyroid  nodule:  Pt denies: - hoarseness  - choking  Tyroid ultrasound report (04/15/2013): Right thyroid  lobe:  4.5 x 2.1 x 1.6 cm Left thyroid  lobe:  4.5 x 1.6 x 2.1 cm Isthmus:  6 mm   Focal nodules:  Thyroid  echotexture is diffusely heterogeneous. Thyroid  morphology is diffusely lobular.   5 mm hyperechoic focus in the interpolar left lobe on image 24.   Otherwise, no measurable focal solid or cystic nodules seen.   Diffusely increased vascularity.   Lymphadenopathy:  None visualized.   IMPRESSION: Lobular thyroid  diffusely with heterogeneous echotexture and suggestion of increased vascularity.  Appearance is nonspecific and could be related to thyroiditis or its sequela.  Thyroid  U/S (09/30/2021): Parenchymal Echotexture: Mildly heterogenous Isthmus: 7 mm  Right lobe: 4.1 x 1.9 x 1.0 cm  Left lobe: 3.8 x 1.5 x 1.8 cm _________________________________________________________   Nonspecific diffuse mild thyroid  heterogeneity without hypervascularity, discrete mass, or nodule. No regional adenopathy.   IMPRESSION: Mild thyroid  heterogeneity compatible with medical thyroid  disease. Negative for nodule.  No FH of thyroid  cancer. No h/o radiation tx  to head or neck. No Biotin use. No recent steroids use.   DM2: - dx 08/2021 - Previously with increased thirst and increased urination.   - She has a family history of diabetes in mother, father, grandmother.    She is on: - Farxiga  5 mg before b'fast - started 2022 by cardiology - Ozempic  0.25 >> 0.5 >> 1 >> 2 mg weekly (last dose increase 12/2022) We had to stop metformin  due to diarrhea even with the lowest dose of metformin  ER.  Reviewed HbA1c  levels: Lab Results  Component Value Date   HGBA1C 5.7 (A) 07/17/2023   HGBA1C 5.7 (A) 01/09/2023   HGBA1C 5.8 (A) 06/06/2022   HGBA1C 7.1 (A) 09/16/2021   HGBA1C 7.3 (H) 06/06/2021  12/29/2021: HbA1c 6.9% 12/13/2021: HbA1c 6.6%  After she came off her CGM, she was not checking blood sugars, then started to check >> now stopped again in last 2 weeks: - am: 120s-160, 298 >> 102-118, 131 >> 89-99 - 2h after b'fast: 150s  >> N/c - lunch: 150-180 >> 94-113 >> n/c - 2h after lunch: n/c>> 129-167 >> n/c - dinner: 93, 112-138 >> 100-128 >> 100-110 - 2h after dinner: n/c >> 163, 174 >> n/c - bedtime: cookie + tea >> 128-172 >> n/c Lowest: high 60s Highest: 160  Reviewed BUN/creatinine: 07/14/2024: 13/0.97, GFR 72, glucose 83 Lab Results  Component Value Date   BUN 17 07/17/2023   Lab Results  Component Value Date   CREATININE 0.96 07/17/2023   Lab Results  Component Value Date   MICRALBCREAT 2 07/17/2023  She is on Entresto .  + HL; latest lipid panel: 07/14/2024: 185/102/55/112 Lab Results  Component Value Date   CHOL 157 07/17/2023   HDL 49 (L) 07/17/2023   LDLCALC 89 07/17/2023   TRIG 97 07/17/2023   CHOLHDL 3.2 07/17/2023   Last eye exam: 11/20/2023- No DR reportedly.   Last foot exam was 01/28/2024-per [facility] records.  She also has MCTD, psoriatic arthritis, Sjogren's syndrome, Raynauds, also vitamin D  deficiency (2000 IU daily)-all managed by her rheumatologist. She had a MRI, then lumbar puncture performed for R leg weakness with falls >> no MS. In 2019, she was diagnosed with LBBB + NICMP (EF reduced, 30%), also pulmonary HTN per review of the results of her  catheterization. She had a pacemaker and a defibrillator placed. She had cardiac rehab.  She is a single mom.  ROS: + See HPI  I reviewed pt's medications, allergies, PMH, social hx, family hx, and changes were documented in the history of present illness. Otherwise, unchanged from my initial visit  note.  Past Medical History:  Diagnosis Date   Anemia    Autoimmune disorder    Bronchitis    hx -uses inhaler prn   Chronic kidney disease    kidney infections   Endometriosis    Fibromyalgia    GERD (gastroesophageal reflux disease)    occasional   Hashimoto thyroiditis, fibrous variant    Hashimoto's disease    Headache    migraines   History of kidney stones    Hypertension    IVCD (intraventricular conduction defect)    LBBB (left bundle branch block)    Nonischemic cardiomyopathy (HCC)    per stress test 12-17-2017  ef 14%;  per echo 12-13-2017 ef 21%   Peripheral vascular disease    raynoids syndrome   Pneumonia    only x 1 - 2- 3 months ago   Pulmonary hypertension (HCC)  per cardiac cath 12-18-2017--- moderate WHO Grp II     Scleroderma (HCC)    Sjogren's syndrome    Sleep apnea    recently dx 04/13/18, does not have CPAP machine yet.   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, NYHA class 3 [facility])    cardiologist-  dr newman patwardhan   Tendinitis    lower back   Thyroiditis    Past Surgical History:  Procedure Laterality Date   BIV ICD INSERTION CRT-D N/A 10/25/2018   Procedure: BIV ICD INSERTION CRT-D;  Surgeon: Waddell Danelle ORN, MD;  Location: [facility] INVASIVE CV LAB;  Service: Cardiovascular;  Laterality: N/A;   CESAREAN SECTION  2004   x 1   CHOLECYSTECTOMY     COLONOSCOPY     COLONOSCOPY WITH PROPOFOL  N/A 07/08/2020   Procedure: COLONOSCOPY WITH PROPOFOL ;  Surgeon: Elicia Claw, MD;  Location: [facility] ENDOSCOPY;  Service: Gastroenterology;  Laterality: N/A;   CYSTOSCOPY     x 2 - bladder as a child   HYSTEROSCOPY WITH D & C N/A 04/16/2018   Procedure: DILATATION AND CURETTAGE /HYSTEROSCOPY;  Surgeon: Horacio Boas, MD;  Location: [facility] ORS;  Service: Gynecology;  Laterality: N/A;   LAPAROSCOPIC LYSIS OF ADHESIONS  04/16/2018   Procedure: LAPAROSCOPIC LYSIS OF ADHESIONS;  Surgeon: Horacio Boas, MD;  Location: [facility]  ORS;  Service: Gynecology;;   MANDIBLE FRACTURE SURGERY  1994   upper mandibular   POLYPECTOMY  07/08/2020   Procedure: POLYPECTOMY;  Surgeon: Elicia Claw, MD;  Location: [facility] ENDOSCOPY;  Service: Gastroenterology;;   RIGHT/LEFT HEART CATH AND CORONARY ANGIOGRAPHY N/A 12/18/2017   Procedure: RIGHT/LEFT HEART CATH AND CORONARY ANGIOGRAPHY;  Surgeon: Elmira Newman PARAS, MD;  Location: [facility] INVASIVE CV LAB;  Service: Cardiovascular;  Laterality: N/A;   UPPER GI ENDOSCOPY     WISDOM TOOTH EXTRACTION     Social History   Socioeconomic History   Marital status: Single    Spouse name: Not on file   Number of children: 1   Years of education: 14   Highest education level: Associate degree: occupational, scientist, product/process development, or vocational program  Occupational History   Occupation: not working  Tobacco Use   Smoking status: Former    Current packs/day: 0.00    Average packs/day: 0.5 packs/day for 8.0 years (4.0 ttl pk-yrs)    Types: Cigarettes    Start date: 12/10/2004    Quit date: 12/10/2012    Years since quitting: 11.6   Smokeless tobacco: Never  Vaping Use   Vaping status: Never Used  Substance and Sexual Activity   Alcohol use: Yes    Comment: once a year   Drug use: No   Sexual activity: Yes    Birth control/protection: Pill  Other Topics Concern   Not on file  Social History Narrative   Lives with son, renting a room from her mother.  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     Education: college.    Right handed   1 coffee/tea  a day   Social Drivers of Corporate Investment Banker Strain: Not on file  Food Insecurity: Not on file  Transportation Needs: Not on file  Physical Activity: Not on file  Stress: Not on file  Social Connections: Not on file  Intimate Partner Violence: Not on file   Current Outpatient Medications on File Prior to Visit  Medication Sig Dispense Refill   albuterol (PROVENTIL HFA;VENTOLIN  HFA) 108 (90 Base)  MCG/ACT inhaler Inhale 1 puff into the lungs every 4 (four) hours as needed for wheezing or shortness of breath.      cetirizine (ZYRTEC) 10 MG tablet Take 10 mg by mouth daily.     cyclobenzaprine  (FLEXERIL ) 10 MG tablet Take 10 mg by mouth. As needed     cycloSPORINE  (RESTASIS ) 0.05 % ophthalmic emulsion Place 1 drop into both eyes 2 (two) times daily.     dapagliflozin  propanediol (FARXIGA ) 5 MG TABS tablet TAKE 1 TABLET (5 MG TOTAL) BY MOUTH DAILY. 90 tablet 3   furosemide  (LASIX ) 20 MG tablet TAKE 1 TABLET BY MOUTH EVERY DAY AS NEEDED 90 tablet 2   gabapentin  (NEURONTIN ) 300 MG capsule Take 2 capsules (600 mg total) by mouth 2 (two) times daily. 360 capsule 3   ivabradine  (CORLANOR ) 7.5 MG TABS tablet TAKE 1 TABLET (7.5 MG TOTAL) BY MOUTH 2 (TWO) TIMES DAILY WITH A MEAL. 180 tablet 2   levothyroxine  (SYNTHROID ) 50 MCG tablet TAKE 1 TABLET BY MOUTH EVERY DAY BEFORE BREAKFAST 30 tablet 0   meclizine  (ANTIVERT ) 12.5 MG tablet TAKE 1 TABLET (12.5 MG TOTAL) BY MOUTH 2 (TWO) TIMES DAILY AS NEEDED FOR DIZZINESS. 30 tablet 1   metoprolol  succinate (TOPROL -XL) 50 MG 24 hr tablet TAKE 1 TABLET BY MOUTH EVERY DAY WITH OR IMMEDIATELY FOLLOWING A MEAL 90 tablet 2   montelukast (SINGULAIR) 10 MG tablet Take 1 tablet by mouth daily.     ondansetron  (ZOFRAN -ODT) 4 MG disintegrating tablet      OTEZLA 30 MG TABS Take 1 tablet by mouth 2 (two) times daily.     Probiotic Product (PROBIOTIC PO) Take 1 capsule by mouth daily.     Rimegepant Sulfate (NURTEC) 75 MG TBDP Take for severe migraine, limit to twice per week. 8 tablet 5   sacubitril -valsartan  (ENTRESTO ) 97-103 MG TAKE 1 TABLET BY MOUTH TWICE A DAY 180 tablet 3   selenium  (SELENIMIN-200) 200 MCG TABS tablet Take 200 mcg by mouth daily.      Semaglutide , 2 MG/DOSE, (OZEMPIC , 2 MG/DOSE,) 8 MG/3ML SOPN INJECT 2 MG INTO THE SKIN ONCE A WEEK. 9 mL 3   sodium chloride  (OCEAN) 0.65 % SOLN nasal spray Place 1 spray into both nostrils as needed for congestion.      spironolactone  (ALDACTONE ) 50 MG tablet Take 1 tablet (50 mg total) by mouth daily. 90 tablet 2   traMADol  (ULTRAM ) 50 MG tablet 1 tablet as needed     No current facility-administered medications on file prior to visit.   Allergies  Allergen Reactions   Sulfa Antibiotics Nausea And Vomiting   Wasp Venom Other (See Comments)   Wasp Venom Protein Other (See Comments)   Family History  Problem Relation Age of Onset   Diabetes Mother        Type II   Heart disease Mother        stent surgery   Hypertension Mother    Hyperlipidemia Mother    Diabetes Father        Type II   Hypertension Father    Hyperlipidemia Father    PE: There were no vitals taken for this visit.  Wt Readings from Last 20 Encounters:  11/14/23 272 lb 12.8 oz (123.7 kg)  07/17/23 287 lb 12.8 oz (130.5 kg)  06/12/23 292 lb (132.5 kg)  01/09/23 (!) 303 lb (137.4 kg)  12/22/22 (!) 301 lb (136.5 kg)  06/06/22 (!) 313 lb 3.2 oz (142.1 kg)  06/01/22 (!) 315 lb (142.9 kg)  05/11/22 (!) 315 lb 9.6 oz (143.2 kg)  05/05/22 (!) 314 lb (142.4 kg)  12/29/21 (!) 323 lb 12.8 oz (146.9 kg)  09/16/21 (!) 327 lb (148.3 kg)  08/01/21 (!) 326 lb (147.9 kg)  06/06/21 (!) 326 lb 12.8 oz (148.2 kg)  03/21/21 (!) 326 lb (147.9 kg)  03/04/21 (!) 325 lb (147.4 kg)  07/08/20 (!) 316 lb (143.3 kg)  02/18/20 (!) 325 lb (147.4 kg)  01/14/20 (!) 320 lb (145.2 kg)  10/22/19 (!) 310 lb (140.6 kg)  07/03/19 (!) 305 lb (138.3 kg)   Constitutional: overweight, in NAD Eyes:  EOMI, no exophthalmos ENT: no neck masses, no cervical lymphadenopathy Cardiovascular: RRR, No MRG Respiratory: CTA B Musculoskeletal: no deformities Skin:no rashes Neurological: no tremor with outstretched hands  ASSESSMENT: 1. Hashimoto's hypothyroidism  2.  Thyroid  nodule  3.  Type 2 diabetes which - FH of DM  PLAN:  1. Patient with history of Hashimoto's thyroiditis, on levothyroxine .  She also continues selenium  200 mcg daily. -She has  intermittent pressure in the neck most likely due to Hashimoto's thyroid  inflammation - latest thyroid  labs reviewed with pt. >> normal 07/14/2024: TSH 0.648 - she continues on LT4 50 mcg daily - pt feels good on this dose.  For our last visit she lost approximately 25 pounds in the previous year and since then, she lost another 72. - we discussed about taking the thyroid  hormone every day, with water, >30 minutes before breakfast, separated by >4 hours from acid reflux medications, calcium, iron, multivitamins. Pt. is taking it correctly.  2.  Thyroid  nodule -She previously had neck pressure, but this was deemed to be most likely related to her Hashimoto's thyroiditis rather than the small nodule, measuring 5 mm on the ultrasound from 2014 - In 2023, she again described dysphagia and pressure in neck.  Another thyroid  ultrasound was checked and this did not show any abnormalities beyond heterogeneity consistent with Hashimoto's thyroiditis.  Her symptoms have resolved since. - Will continue selenium  which can help decrease thyroid  inflammation - We can continue to monitor her expectantly  3.  Type 2 diabetes -Patient with controlled type 2 diabetes managed with SGLT2 inhibitor and GLP-1 receptor agonist, with the latest HbA1c being 5.6% last month, previously 5.7%.  At last visit, sugars appeared to be at goal whenever checked so we did not change her regimen.  - I advised her to: Patient Instructions  Please continue: - Farxiga  5 mg before b'fast - Ozempic  2 mg weekly  Continue Levothyroxine  50 mcg daily.  Take the thyroid  hormone every day, with water, at least 30 minutes before breakfast, separated by at least 4 hours from: - acid reflux medications - calcium - iron - multivitamins  Please return for another visit in 1 year.  - advised to check sugars at different times of the day - 1x a day, rotating check times - advised for yearly eye exams >> she is UTD - return to clinic in  1 year  Needs levothyroxine  refills.   Lela Fendt, MD PhD [facility] Endocr[facility]

## 2024-08-01 ENCOUNTER — Other Ambulatory Visit: Payer: Self-pay | Admitting: Neurology

## 2024-08-05 ENCOUNTER — Ambulatory Visit: Payer: Medicaid Other

## 2024-08-05 DIAGNOSIS — I5022 Chronic systolic (congestive) heart failure: Secondary | ICD-10-CM

## 2024-08-06 LAB — CUP PACEART REMOTE DEVICE CHECK
Battery Remaining Longevity: 32 mo
Battery Remaining Percentage: 34 %
Battery Voltage: 2.92 V
Brady Statistic AP VP Percent: 3.6 %
Brady Statistic AP VS Percent: 1 %
Brady Statistic AS VP Percent: 96 %
Brady Statistic AS VS Percent: 1 %
Brady Statistic RA Percent Paced: 4.1 %
Date Time Interrogation Session: 20251216020720
HighPow Impedance: 89 Ohm
HighPow Impedance: 89 Ohm
Implantable Lead Connection Status: 753985
Implantable Lead Connection Status: 753985
Implantable Lead Connection Status: 753985
Implantable Lead Implant Date: 20200306
Implantable Lead Implant Date: 20200306
Implantable Lead Implant Date: 20200306
Implantable Lead Location: 753858
Implantable Lead Location: 753859
Implantable Lead Location: 753860
Implantable Lead Model: 7122
Implantable Pulse Generator Implant Date: 20200306
Lead Channel Impedance Value: 1100 Ohm
Lead Channel Impedance Value: 460 Ohm
Lead Channel Impedance Value: 560 Ohm
Lead Channel Pacing Threshold Amplitude: 0.5 V
Lead Channel Pacing Threshold Amplitude: 1.125 V
Lead Channel Pacing Threshold Amplitude: 1.375 V
Lead Channel Pacing Threshold Pulse Width: 0.5 ms
Lead Channel Pacing Threshold Pulse Width: 0.5 ms
Lead Channel Pacing Threshold Pulse Width: 0.5 ms
Lead Channel Sensing Intrinsic Amplitude: 12 mV
Lead Channel Sensing Intrinsic Amplitude: 5 mV
Lead Channel Setting Pacing Amplitude: 2 V
Lead Channel Setting Pacing Amplitude: 2.125
Lead Channel Setting Pacing Amplitude: 2.375
Lead Channel Setting Pacing Pulse Width: 0.5 ms
Lead Channel Setting Pacing Pulse Width: 0.5 ms
Lead Channel Setting Sensing Sensitivity: 0.5 mV
Pulse Gen Serial Number: 9878925
Zone Setting Status: 755011

## 2024-08-08 ENCOUNTER — Other Ambulatory Visit: Payer: Self-pay | Admitting: Internal Medicine

## 2024-08-17 ENCOUNTER — Ambulatory Visit: Payer: Self-pay | Admitting: Internal Medicine
